# Patient Record
Sex: Male | Born: 1935 | Race: White | Hispanic: No | Marital: Married | Smoking: Former smoker
Health system: Southern US, Community
[De-identification: ages and names within clinical notes are randomized; demographics above are authoritative.]

## PROBLEM LIST (undated history)

## (undated) DIAGNOSIS — E042 Nontoxic multinodular goiter: Secondary | ICD-10-CM

## (undated) DIAGNOSIS — K219 Gastro-esophageal reflux disease without esophagitis: Secondary | ICD-10-CM

## (undated) DIAGNOSIS — H9319 Tinnitus, unspecified ear: Secondary | ICD-10-CM

## (undated) DIAGNOSIS — R918 Other nonspecific abnormal finding of lung field: Secondary | ICD-10-CM

## (undated) DIAGNOSIS — R7303 Prediabetes: Secondary | ICD-10-CM

## (undated) DIAGNOSIS — I10 Essential (primary) hypertension: Secondary | ICD-10-CM

## (undated) DIAGNOSIS — N400 Enlarged prostate without lower urinary tract symptoms: Secondary | ICD-10-CM

## (undated) DIAGNOSIS — M51369 Other intervertebral disc degeneration, lumbar region without mention of lumbar back pain or lower extremity pain: Secondary | ICD-10-CM

## (undated) DIAGNOSIS — F419 Anxiety disorder, unspecified: Secondary | ICD-10-CM

## (undated) DIAGNOSIS — M5136 Other intervertebral disc degeneration, lumbar region: Secondary | ICD-10-CM

## (undated) DIAGNOSIS — M5030 Other cervical disc degeneration, unspecified cervical region: Secondary | ICD-10-CM

## (undated) DIAGNOSIS — E7800 Pure hypercholesterolemia, unspecified: Secondary | ICD-10-CM

## (undated) HISTORY — DX: Nontoxic multinodular goiter: E04.2

## (undated) HISTORY — DX: Benign prostatic hyperplasia without lower urinary tract symptoms: N40.0

## (undated) HISTORY — DX: Anxiety disorder, unspecified: F41.9

## (undated) HISTORY — DX: Tinnitus, unspecified ear: H93.19

## (undated) HISTORY — DX: Other cervical disc degeneration, unspecified cervical region: M50.30

## (undated) HISTORY — DX: Other intervertebral disc degeneration, lumbar region: M51.36

## (undated) HISTORY — DX: Other intervertebral disc degeneration, lumbar region without mention of lumbar back pain or lower extremity pain: M51.369

## (undated) HISTORY — DX: Other nonspecific abnormal finding of lung field: R91.8

## (undated) HISTORY — PX: OTHER SURGICAL HISTORY: SHX169

## (undated) HISTORY — DX: Pure hypercholesterolemia, unspecified: E78.00

## (undated) HISTORY — DX: Prediabetes: R73.03

## (undated) HISTORY — DX: Essential (primary) hypertension: I10

## (undated) HISTORY — DX: Gastro-esophageal reflux disease without esophagitis: K21.9

## (undated) HISTORY — PX: HERNIA REPAIR: SHX51

---

## 1999-10-08 ENCOUNTER — Encounter: Payer: Self-pay | Admitting: Family Medicine

## 1999-10-08 ENCOUNTER — Encounter: Admission: RE | Admit: 1999-10-08 | Discharge: 1999-10-08 | Payer: Self-pay | Admitting: Family Medicine

## 2001-06-10 ENCOUNTER — Encounter: Admission: RE | Admit: 2001-06-10 | Discharge: 2001-06-10 | Payer: Self-pay | Admitting: Unknown Physician Specialty

## 2001-06-10 ENCOUNTER — Encounter: Payer: Self-pay | Admitting: Unknown Physician Specialty

## 2004-11-02 ENCOUNTER — Ambulatory Visit: Payer: Self-pay | Admitting: Family Medicine

## 2004-12-08 ENCOUNTER — Ambulatory Visit: Payer: Self-pay | Admitting: Family Medicine

## 2005-06-16 ENCOUNTER — Ambulatory Visit: Payer: Self-pay | Admitting: Family Medicine

## 2014-01-14 HISTORY — PX: AORTIC VALVE REPLACEMENT: SHX41

## 2014-04-29 ENCOUNTER — Encounter: Payer: Self-pay | Admitting: *Deleted

## 2014-04-30 ENCOUNTER — Encounter (INDEPENDENT_AMBULATORY_CARE_PROVIDER_SITE_OTHER): Payer: Self-pay

## 2014-04-30 ENCOUNTER — Encounter: Payer: Self-pay | Admitting: Neurology

## 2014-04-30 ENCOUNTER — Ambulatory Visit (INDEPENDENT_AMBULATORY_CARE_PROVIDER_SITE_OTHER): Payer: Medicare Other | Admitting: Neurology

## 2014-04-30 VITALS — BP 137/70 | HR 76 | Temp 97.7°F | Ht 67.0 in | Wt 197.5 lb

## 2014-04-30 DIAGNOSIS — R5381 Other malaise: Secondary | ICD-10-CM

## 2014-04-30 DIAGNOSIS — R269 Unspecified abnormalities of gait and mobility: Secondary | ICD-10-CM

## 2014-04-30 DIAGNOSIS — Z952 Presence of prosthetic heart valve: Secondary | ICD-10-CM

## 2014-04-30 DIAGNOSIS — Z954 Presence of other heart-valve replacement: Secondary | ICD-10-CM

## 2014-04-30 DIAGNOSIS — R251 Tremor, unspecified: Secondary | ICD-10-CM

## 2014-04-30 NOTE — Patient Instructions (Addendum)
Please continue to work on your walking and strengthening exercises.    I do want to suggest a few things today:  Remember to drink plenty of fluid, eat healthy meals and do not skip any meals. Try to eat protein with a every meal and eat a healthy snack such as fruit or nuts in between meals. Try to keep a regular sleep-wake schedule and try to exercise daily if you can. Use your walker at all times.   Try to stay active physically and mentally. Engage in social activities in your community and with your family and try to keep up with current events by reading the newspaper or watching the news. Try to do word puzzles and you may like to do word puzzles and brain games on the computer such as on [email or web address].   I would like to see you back in 3 months, sooner if we need to. Please call us with any interim questions, concerns, problems, updates or refill requests.  Our phone number is [phone]. We also have an after hours call service for urgent matters and there is a physician on-call for urgent questions, that cannot wait till the next work day. For any emergencies you know to call 911 or go to the nearest emergency room.

## 2014-04-30 NOTE — Progress Notes (Signed)
Subjective:    Patient ID: Roy Mitchell is a 80 y.o. male.  HPI    Huston Foley, MD, PhD [facility] [address] [address] [place], [place] [address]  Dear Harrold Donath,  I saw your patient, Roy Mitchell, upon your kind request in my neurologic clinic today for initial consultation of his tremor. The patient is accompanied by his daughter, Roy Mitchell and his wife today. As you know, Roy Mitchell with an underlying complex medical history of hyperlipidemia, recent pneumonia for which he was hospitalized from 03/27/2014 to 04/01/2014, BPH, reflux disease, degenerative neck disease, thyroid disease, prediabetes, hypertension, anxiety, recent aortic valve replacement for severe aortic stenosis in July 2015, who reports a hand tremor, right more than left for the past 10+ years. His family has noted some gait difficulties and balance issues and difficulty rising from the chair. His daughter is Publishing rights manager and is particularly worried about parkinsonism or Parkinson's disease. He is currently in home health physical therapy. After his setback with the pneumonia he was very deconditioned and was practically bedbound for some time. He has also had some issues with urinary retention for which he was on a Foley catheter and an in and out catheterization but has since then been able to regain urinary control. He has some constipation off and on which they are treating. He does not have much in the way of memory loss. He is supposed to start cardiac rehabilitation once he is able to go through with it as far as his stamina is concerned. This is projected to start in about a month from now. He has a total of 5 children the oldest of whom is 73 and the youngest is 37. His children, especially his youngest daughter are very intimately involved in his care. His wife is in reasonably good health and his primary caregiver.  His Past Medical History  Is Significant For: Past Medical History  Diagnosis Date  . Hypercholesterolemia   . Benign prostatic hypertrophy   . GERD (gastroesophageal reflux disease)   . DDD (degenerative disc disease), cervical   . Tinnitus   . Multinodular goiter   . Prediabetes   . DDD (degenerative disc disease), lumbar   . Hypertension   . Pulmonary nodules   . Anxiety disorder     His Past Surgical History Is Significant For: Past Surgical History  Procedure Laterality Date  . Aortic valve replacement  01/2014  . History of blood transfusion    . Hernia repair      6 months old    His Family History Is Significant For: Family History  Problem Relation Age of Onset  . Congestive Heart Failure Mother     His Social History Is Significant For: History   Social History  . Marital Status: Married    Spouse Name: Roy Mitchell    Number of Children: 5  . Years of Education: 12   Occupational History  .      retired   Social History Main Topics  . Smoking status: Former Games developer  . Smokeless tobacco: Never Used     Comment: QUIT IN 1970  . Alcohol Use: No  . Drug Use: No  . Sexual Activity: None   Other Topics Concern  . None   Social History Narrative   Patient consumes caffiene(candy)    His Allergies Are:  Not on File:   His Current Medications Are:  Outpatient Encounter Prescriptions as of 04/30/2014  Medication Sig  . albuterol (PROVENTIL HFA;VENTOLIN HFA) 108 (90 BASE) MCG/ACT inhaler Inhale 2 puffs into the lungs every 6 (six) hours as needed for wheezing or shortness of breath.  Marland Kitchen. albuterol (PROVENTIL) (2.5 MG/3ML) 0.083% nebulizer solution Take 2.5 mg by nebulization every 6 (six) hours as needed for wheezing or shortness of breath.  Marland Kitchen. CALCIUM-VITAMIN D PO Take 600 mg by mouth daily.  . clopidogrel (PLAVIX) 75 MG tablet Take 75 mg by mouth daily.  Marland Kitchen. dextromethorphan-guaiFENesin (MUCINEX DM) 30-600 MG per 12 hr tablet Take 1 tablet by mouth 2 (two) times daily.  Marland Kitchen.  escitalopram (LEXAPRO) 5 MG tablet Take 5 mg by mouth daily.  . finasteride (PROSCAR) 5 MG tablet Take 5 mg by mouth daily.  . Fluticasone-Salmeterol (ADVAIR) 250-50 MCG/DOSE AEPB Inhale 1 puff into the lungs 2 (two) times daily.  Marland Kitchen. ibuprofen (ADVIL,MOTRIN) 200 MG tablet Take 200 mg by mouth every 6 (six) hours as needed.  Marland Kitchen. LORazepam (ATIVAN) 2 MG tablet Take 2 mg by mouth daily. 1 1/2 tablet daily at bedtime  . Multiple Vitamins-Minerals (CENTRUM SILVER PO) Take 1 tablet by mouth daily.  . pantoprazole (PROTONIX) 20 MG tablet Take 20 mg by mouth daily.  . Polyethylene Glycol 3350 (MIRALAX PO) Take by mouth daily. As needed  . simvastatin (ZOCOR) 40 MG tablet Take 40 mg by mouth daily.  . tamsulosin (FLOMAX) 0.4 MG CAPS capsule Take 0.4 mg by mouth at bedtime.  . Vitamins-Lipotropics (LIPO-FLAVONOID PLUS PO) Take by mouth daily.  . [DISCONTINUED] diltiazem (DILTIAZEM HCL CD) 360 MG 24 hr capsule Take 360 mg by mouth daily.  . [DISCONTINUED] FA-Pyridoxine-Cyancobalamin (ALLANTEX PO) Take 4 mg by mouth daily.  :   Review of Systems:  Out of a complete 14 point review of systems, all are reviewed and negative with the exception of these symptoms as listed below: Review of Systems  HENT:       Ringing in ears  Allergic/Immunologic: Positive for environmental allergies.       Runny nose  Neurological:       Snoring, numbness, dizziness, tremor    Objective:  Neurologic Exam  Physical Exam Physical Examination:   Filed Vitals:   04/30/14 1358  BP: 137/70  Pulse: 76  Temp: 97.7 F (36.5 C)    General Examination: The patient is a very pleasant 80 y.o. male in no acute distress. He is situated in his wheelchair. He also brought in his 2 wheeled walker. He looks mildly deconditioned and frail.  HEENT: Normocephalic, atraumatic, pupils are equal, round and reactive to light and accommodation.Extraocular tracking shows mild saccadic breakdown without nystagmus noted. There is  limitation to upper gaze. There is no significant decrease in eye blink rate. Hearing is intact. face is symmetric with no significant facial masking and normal facial sensation. There is no lip, neck or jaw tremor. Neck is mildly to moderately rigid with intact passive ROM. There are no carotid bruits on auscultation. Oropharynx exam reveals moderate mouth dryness. No significant airway crowding is noted. Mallampati is class II. Tongue protrudes centrally and palate elevates symmetrically.   There is no drooling.   Chest: is clear to auscultation without wheezing, rhonchi or crackles noted.  Heart: sounds are regular and normal without murmurs, rubs or gallops noted.   Abdomen: is soft, non-tender and non-distended with normal bowel sounds appreciated on auscultation.  Extremities: There is 1+ pitting edema in the distal lower extremities bilaterally.   Skin: is warm and dry with no  trophic changes noted. Age-related changes are noted on the skin. He also has multiple old looking bruises over his hands and forearms. Of note he is on Plavix.  Musculoskeletal: exam reveals no obvious joint deformities, tenderness, joint swelling or erythema.  Neurologically:  Mental status: The patient is awake and alert, paying good  attention. He is able to provide the history. His wife and daugher provide details. He is oriented to: person, place, situation, day of week, month of year and year. His memory, attention, language and knowledge are fairly well-preserved. There is no aphasia, agnosia, apraxia or anomia. There is a no significant degree of bradyphrenia. Speech is mildly hypophonic with no dysarthria noted. Mood is congruent and affect is normal.  Cranial nerves are as described above under HEENT exam. In addition, shoulder shrug is normal with equal shoulder height noted.  Motor exam: Mildly thin bulk is noted. Strength is overall 4+ to 5 minus out of 5 with no weakness noted in the left hip flexor. Of  note he has a history of DVT in the left leg in the distant past. There are no dyskinesias noted. Tone is mildly rigid in the upper extremities with slight cogwheeling noted bilaterally. He has no resting tremor. He has a mild bilateral upper extremity postural and action tremor. He has no cogwheel rigidity or increase in tone in the lower extremities. Reflexes are 1+ in the upper extremities and absent in both lower extremities. Sensory exam is intact to light touch, pinprick, temperature sense and vibration sense. Fine motor skills with finger taps, hand movements and rapid alternating patting are overall mildly impaired as well as lower extremity fine motor skills. There is no significant lateralization. Romberg is not tested. Heel-to-shin is not really possible for him. He stands with difficulty and pushes himself up. He needs mild assistance. He uses his 2 wheeled walker fairly well. He is slightly insecure and slow. He does not have a parkinsonian shuffle. He has a moderate stoop and has a tendency to lean his upper body and his neck slightly to the right. He turns in several steps. He is slightly insecure with turning. Tandem walk is not possible.  Assessment and Plan:   In summary, Roy Mitchell is a very pleasant 80 y.o.-year old male with an underlying complex medical history of hyperlipidemia, recent pneumonia for which he was hospitalized from 03/27/2014 to 04/01/2014, BPH, reflux disease, degenerative neck disease, thyroid disease, prediabetes, hypertension, anxiety, recent aortic valve replacement for severe aortic stenosis in July 2015, who presents with a history of tremors of his upper extremities, gait disorder, and balance problems. His tremors have been ongoing for several years. Upon examination he has a mild bilateral upper extremity action and postural tremor, he has no resting tremor, and though he does have some upper extremity rigidity, neck rigidity but otherwise no lateralizing  findings in keeping with Parkinson's disease, I feel that most likely he has mild essential tremor. While he may have a touch of parkinsonism, I would be reluctant to treat his tremor or his very mild parkinsonian presentation. His gait disorder is probably a function of overall deconditioning, recent systemic illness, recent anesthesia and surgery, and in talking to the family, he has made quite some progress in his physical rehabilitation. At this juncture, I encouraged the patient to continue with home health therapy and consider cardiac rehabilitation if possible. He is advised to stay well hydrated and take care of good nutrition. We will not add any new tests  or medication at this time and follow him clinically. The patient and his family were in agreement. To that end, I would like to see him back in 3 months, sooner if the need arises.  Thank you very much for allowing me to participate in the care of this nice patient. If I can be of any further assistance to you please do not hesitate to call me at [phone].  Sincerely,   Huston Foley, MD, PhD

## 2014-06-26 ENCOUNTER — Encounter (HOSPITAL_COMMUNITY)
Admission: EM | Disposition: A | Discharge status: Nursing Home (Includes ICF) | Payer: Self-pay | Source: Home / Self Care | Attending: Internal Medicine

## 2014-06-26 ENCOUNTER — Inpatient Hospital Stay (HOSPITAL_COMMUNITY): Payer: Medicare Other | Admitting: Anesthesiology

## 2014-06-26 ENCOUNTER — Inpatient Hospital Stay (HOSPITAL_COMMUNITY): Payer: Medicare Other

## 2014-06-26 ENCOUNTER — Emergency Department (HOSPITAL_COMMUNITY): Payer: Medicare Other

## 2014-06-26 ENCOUNTER — Inpatient Hospital Stay (HOSPITAL_COMMUNITY)
Admission: EM | Admit: 2014-06-26 | Discharge: 2014-07-08 | DRG: 025 | Disposition: A | Discharge status: Other Acute Inpatient Hospital | Payer: Medicare Other | Attending: Internal Medicine | Admitting: Internal Medicine

## 2014-06-26 ENCOUNTER — Encounter (HOSPITAL_COMMUNITY): Payer: Self-pay | Admitting: *Deleted

## 2014-06-26 DIAGNOSIS — J90 Pleural effusion, not elsewhere classified: Secondary | ICD-10-CM | POA: Diagnosis present

## 2014-06-26 DIAGNOSIS — Z79899 Other long term (current) drug therapy: Secondary | ICD-10-CM | POA: Diagnosis not present

## 2014-06-26 DIAGNOSIS — S065X4A Traumatic subdural hemorrhage with loss of consciousness of 6 hours to 24 hours, initial encounter: Secondary | ICD-10-CM | POA: Diagnosis present

## 2014-06-26 DIAGNOSIS — S065XAA Traumatic subdural hemorrhage with loss of consciousness status unknown, initial encounter: Secondary | ICD-10-CM

## 2014-06-26 DIAGNOSIS — Z888 Allergy status to other drugs, medicaments and biological substances status: Secondary | ICD-10-CM

## 2014-06-26 DIAGNOSIS — W19XXXA Unspecified fall, initial encounter: Secondary | ICD-10-CM | POA: Diagnosis present

## 2014-06-26 DIAGNOSIS — T17590A Other foreign object in bronchus causing asphyxiation, initial encounter: Secondary | ICD-10-CM | POA: Diagnosis not present

## 2014-06-26 DIAGNOSIS — J9819 Other pulmonary collapse: Secondary | ICD-10-CM | POA: Insufficient documentation

## 2014-06-26 DIAGNOSIS — R4701 Aphasia: Secondary | ICD-10-CM | POA: Diagnosis present

## 2014-06-26 DIAGNOSIS — Z880 Allergy status to penicillin: Secondary | ICD-10-CM | POA: Diagnosis not present

## 2014-06-26 DIAGNOSIS — R269 Unspecified abnormalities of gait and mobility: Secondary | ICD-10-CM | POA: Diagnosis present

## 2014-06-26 DIAGNOSIS — G8191 Hemiplegia, unspecified affecting right dominant side: Secondary | ICD-10-CM | POA: Diagnosis present

## 2014-06-26 DIAGNOSIS — R1310 Dysphagia, unspecified: Secondary | ICD-10-CM | POA: Diagnosis present

## 2014-06-26 DIAGNOSIS — I10 Essential (primary) hypertension: Secondary | ICD-10-CM | POA: Diagnosis present

## 2014-06-26 DIAGNOSIS — Z87891 Personal history of nicotine dependence: Secondary | ICD-10-CM

## 2014-06-26 DIAGNOSIS — Z7951 Long term (current) use of inhaled steroids: Secondary | ICD-10-CM | POA: Diagnosis not present

## 2014-06-26 DIAGNOSIS — F329 Major depressive disorder, single episode, unspecified: Secondary | ICD-10-CM | POA: Diagnosis present

## 2014-06-26 DIAGNOSIS — W1830XA Fall on same level, unspecified, initial encounter: Secondary | ICD-10-CM | POA: Diagnosis present

## 2014-06-26 DIAGNOSIS — Z9981 Dependence on supplemental oxygen: Secondary | ICD-10-CM | POA: Diagnosis not present

## 2014-06-26 DIAGNOSIS — Z4659 Encounter for fitting and adjustment of other gastrointestinal appliance and device: Secondary | ICD-10-CM

## 2014-06-26 DIAGNOSIS — Y92480 Sidewalk as the place of occurrence of the external cause: Secondary | ICD-10-CM

## 2014-06-26 DIAGNOSIS — S065X0A Traumatic subdural hemorrhage without loss of consciousness, initial encounter: Secondary | ICD-10-CM | POA: Diagnosis present

## 2014-06-26 DIAGNOSIS — Z7902 Long term (current) use of antithrombotics/antiplatelets: Secondary | ICD-10-CM | POA: Diagnosis not present

## 2014-06-26 DIAGNOSIS — K219 Gastro-esophageal reflux disease without esophagitis: Secondary | ICD-10-CM | POA: Diagnosis present

## 2014-06-26 DIAGNOSIS — Z953 Presence of xenogenic heart valve: Secondary | ICD-10-CM

## 2014-06-26 DIAGNOSIS — J189 Pneumonia, unspecified organism: Secondary | ICD-10-CM

## 2014-06-26 DIAGNOSIS — S065X9A Traumatic subdural hemorrhage with loss of consciousness of unspecified duration, initial encounter: Secondary | ICD-10-CM

## 2014-06-26 DIAGNOSIS — Z66 Do not resuscitate: Secondary | ICD-10-CM | POA: Diagnosis not present

## 2014-06-26 DIAGNOSIS — J9690 Respiratory failure, unspecified, unspecified whether with hypoxia or hypercapnia: Secondary | ICD-10-CM

## 2014-06-26 DIAGNOSIS — Z452 Encounter for adjustment and management of vascular access device: Secondary | ICD-10-CM

## 2014-06-26 DIAGNOSIS — J9600 Acute respiratory failure, unspecified whether with hypoxia or hypercapnia: Secondary | ICD-10-CM | POA: Insufficient documentation

## 2014-06-26 DIAGNOSIS — T85598A Other mechanical complication of other gastrointestinal prosthetic devices, implants and grafts, initial encounter: Secondary | ICD-10-CM

## 2014-06-26 DIAGNOSIS — J9601 Acute respiratory failure with hypoxia: Secondary | ICD-10-CM

## 2014-06-26 DIAGNOSIS — J449 Chronic obstructive pulmonary disease, unspecified: Secondary | ICD-10-CM | POA: Diagnosis present

## 2014-06-26 DIAGNOSIS — G250 Essential tremor: Secondary | ICD-10-CM | POA: Diagnosis present

## 2014-06-26 DIAGNOSIS — E785 Hyperlipidemia, unspecified: Secondary | ICD-10-CM | POA: Diagnosis present

## 2014-06-26 DIAGNOSIS — R1110 Vomiting, unspecified: Secondary | ICD-10-CM | POA: Diagnosis not present

## 2014-06-26 DIAGNOSIS — F419 Anxiety disorder, unspecified: Secondary | ICD-10-CM | POA: Diagnosis present

## 2014-06-26 DIAGNOSIS — D649 Anemia, unspecified: Secondary | ICD-10-CM | POA: Diagnosis present

## 2014-06-26 DIAGNOSIS — R739 Hyperglycemia, unspecified: Secondary | ICD-10-CM | POA: Diagnosis present

## 2014-06-26 DIAGNOSIS — F32A Depression, unspecified: Secondary | ICD-10-CM | POA: Diagnosis present

## 2014-06-26 DIAGNOSIS — Z791 Long term (current) use of non-steroidal anti-inflammatories (NSAID): Secondary | ICD-10-CM | POA: Diagnosis not present

## 2014-06-26 DIAGNOSIS — I6200 Nontraumatic subdural hemorrhage, unspecified: Secondary | ICD-10-CM | POA: Diagnosis present

## 2014-06-26 DIAGNOSIS — N400 Enlarged prostate without lower urinary tract symptoms: Secondary | ICD-10-CM | POA: Diagnosis present

## 2014-06-26 DIAGNOSIS — Z978 Presence of other specified devices: Secondary | ICD-10-CM

## 2014-06-26 DIAGNOSIS — R2990 Unspecified symptoms and signs involving the nervous system: Secondary | ICD-10-CM

## 2014-06-26 HISTORY — PX: CRANIOTOMY: SHX93

## 2014-06-26 LAB — URINALYSIS, ROUTINE W REFLEX MICROSCOPIC
Bilirubin Urine: NEGATIVE
Glucose, UA: NEGATIVE mg/dL
Hgb urine dipstick: NEGATIVE
KETONES UR: NEGATIVE mg/dL
Leukocytes, UA: NEGATIVE
NITRITE: NEGATIVE
PH: 7.5 (ref 5.0–8.0)
PROTEIN: NEGATIVE mg/dL
Specific Gravity, Urine: 1.012 (ref 1.005–1.030)
UROBILINOGEN UA: 0.2 mg/dL (ref 0.0–1.0)

## 2014-06-26 LAB — COMPREHENSIVE METABOLIC PANEL
ALK PHOS: 70 U/L (ref 39–117)
ALT: 13 U/L (ref 0–53)
AST: 14 U/L (ref 0–37)
Albumin: 3.5 g/dL (ref 3.5–5.2)
Anion gap: 10 (ref 5–15)
BUN: 17 mg/dL (ref 6–23)
CO2: 29 mEq/L (ref 19–32)
Calcium: 9.2 mg/dL (ref 8.4–10.5)
Chloride: 99 mEq/L (ref 96–112)
Creatinine, Ser: 0.76 mg/dL (ref 0.50–1.35)
GFR calc Af Amer: >90 mL/min (ref >90)
GFR calc non Af Amer: 79 mL/min — ABNORMAL LOW (ref >90)
Glucose, Bld: 111 mg/dL — ABNORMAL HIGH (ref 70–99)
POTASSIUM: 4 meq/L (ref 3.7–5.3)
SODIUM: 138 meq/L (ref 137–147)
Total Bilirubin: 0.8 mg/dL (ref 0.3–1.2)
Total Protein: 6.9 g/dL (ref 6.0–8.3)

## 2014-06-26 LAB — CBC
HCT: 36.8 % — ABNORMAL LOW (ref 39.0–52.0)
Hemoglobin: 12.2 g/dL — ABNORMAL LOW (ref 13.0–17.0)
MCH: 27.2 pg (ref 26.0–34.0)
MCHC: 33.2 g/dL (ref 30.0–36.0)
MCV: 82 fL (ref 78.0–100.0)
PLATELETS: 144 10*3/uL — AB (ref 150–400)
RBC: 4.49 MIL/uL (ref 4.22–5.81)
RDW: 16.4 % — AB (ref 11.5–15.5)
WBC: 7.8 10*3/uL (ref 4.0–10.5)

## 2014-06-26 LAB — DIFFERENTIAL
Basophils Absolute: 0 10*3/uL (ref 0.0–0.1)
Basophils Relative: 1 % (ref 0–1)
EOS ABS: 0.1 10*3/uL (ref 0.0–0.7)
Eosinophils Relative: 1 % (ref 0–5)
LYMPHS ABS: 1.3 10*3/uL (ref 0.7–4.0)
Lymphocytes Relative: 17 % (ref 12–46)
Monocytes Absolute: 0.7 10*3/uL (ref 0.1–1.0)
Monocytes Relative: 9 % (ref 3–12)
NEUTROS PCT: 74 % (ref 43–77)
Neutro Abs: 5.8 10*3/uL (ref 1.7–7.7)

## 2014-06-26 LAB — RAPID URINE DRUG SCREEN, HOSP PERFORMED
Amphetamines: NOT DETECTED
BARBITURATES: NOT DETECTED
BENZODIAZEPINES: NOT DETECTED
Cocaine: NOT DETECTED
Opiates: NOT DETECTED
TETRAHYDROCANNABINOL: NOT DETECTED

## 2014-06-26 LAB — I-STAT TROPONIN, ED: Troponin i, poc: 0 ng/mL (ref 0.00–0.08)

## 2014-06-26 LAB — APTT: aPTT: 29 seconds (ref 24–37)

## 2014-06-26 LAB — PROTIME-INR
INR: 1.02 (ref 0.00–1.49)
PROTHROMBIN TIME: 13.5 s (ref 11.6–15.2)

## 2014-06-26 LAB — ETHANOL: Alcohol, Ethyl (B): <11 mg/dL (ref 0–11)

## 2014-06-26 SURGERY — CRANIOTOMY HEMATOMA EVACUATION EPIDURAL
Anesthesia: General | Site: Head | Laterality: Left

## 2014-06-26 MED ORDER — FENTANYL CITRATE 0.05 MG/ML IJ SOLN: 25.0000 ug | INTRAMUSCULAR | Status: DC | PRN

## 2014-06-26 MED ORDER — ONDANSETRON HCL 4 MG/2ML IJ SOLN
4.0000 mg | INTRAMUSCULAR | Status: DC | PRN
  Administered 2014-06-27 – 2014-07-08 (×3): 4 mg via INTRAVENOUS
  Filled 2014-06-26 (×3): qty 2

## 2014-06-26 MED ORDER — SODIUM CHLORIDE 0.9 % IR SOLN
Status: DC | PRN
  Administered 2014-06-26: 500 mL

## 2014-06-26 MED ORDER — MIDAZOLAM HCL 2 MG/2ML IJ SOLN
INTRAMUSCULAR | Status: AC
  Filled 2014-06-26: qty 2

## 2014-06-26 MED ORDER — VANCOMYCIN HCL IN DEXTROSE 1-5 GM/200ML-% IV SOLN
INTRAVENOUS | Status: AC
Start: 2014-06-26 — End: 2014-06-26
  Administered 2014-06-26: 1000 mg via INTRAVENOUS
  Filled 2014-06-26: qty 200

## 2014-06-26 MED ORDER — BUPIVACAINE HCL (PF) 0.5 % IJ SOLN
INTRAMUSCULAR | Status: DC | PRN
  Administered 2014-06-26: 5 mL

## 2014-06-26 MED ORDER — ONDANSETRON HCL 4 MG PO TABS
4.0000 mg | ORAL_TABLET | ORAL | Status: DC | PRN
Start: 2014-06-26 — End: 2014-06-29

## 2014-06-26 MED ORDER — MIDAZOLAM HCL 2 MG/2ML IJ SOLN
1.0000 mg | INTRAMUSCULAR | Status: DC | PRN
Start: 2014-06-26 — End: 2014-06-27

## 2014-06-26 MED ORDER — THROMBIN 20000 UNITS EX SOLR
CUTANEOUS | Status: DC | PRN
  Administered 2014-06-26: 20 mL via TOPICAL

## 2014-06-26 MED ORDER — ALBUTEROL SULFATE HFA 108 (90 BASE) MCG/ACT IN AERS: 2.0000 | INHALATION_SPRAY | Freq: Four times a day (QID) | RESPIRATORY_TRACT | Status: DC | PRN

## 2014-06-26 MED ORDER — 0.9 % SODIUM CHLORIDE (POUR BTL) OPTIME
TOPICAL | Status: DC | PRN
  Administered 2014-06-26 (×2): 1000 mL

## 2014-06-26 MED ORDER — LIDOCAINE-EPINEPHRINE 1 %-1:100000 IJ SOLN
INTRAMUSCULAR | Status: DC | PRN
  Administered 2014-06-26: 5 mL via INTRADERMAL

## 2014-06-26 MED ORDER — MOMETASONE FURO-FORMOTEROL FUM 100-5 MCG/ACT IN AERO
2.0000 | INHALATION_SPRAY | Freq: Two times a day (BID) | RESPIRATORY_TRACT | Status: DC
  Administered 2014-06-28 – 2014-06-29 (×2): 2 via RESPIRATORY_TRACT
  Filled 2014-06-26 (×2): qty 8.8

## 2014-06-26 MED ORDER — FENTANYL CITRATE 0.05 MG/ML IJ SOLN
50.0000 ug | INTRAMUSCULAR | Status: DC | PRN
  Administered 2014-06-27 – 2014-06-28 (×3): 50 ug via INTRAVENOUS
  Filled 2014-06-26 (×3): qty 2

## 2014-06-26 MED ORDER — PROPOFOL 10 MG/ML IV EMUL
0.0000 ug/kg/min | INTRAVENOUS | Status: DC
  Administered 2014-06-26: 20 ug/kg/min via INTRAVENOUS
  Administered 2014-06-27: 15 ug/kg/min via INTRAVENOUS
  Filled 2014-06-26 (×2): qty 100

## 2014-06-26 MED ORDER — FENTANYL CITRATE 0.05 MG/ML IJ SOLN
INTRAMUSCULAR | Status: DC | PRN
  Administered 2014-06-26: 100 ug via INTRAVENOUS
  Administered 2014-06-26: 150 ug via INTRAVENOUS

## 2014-06-26 MED ORDER — NICARDIPINE HCL IN NACL 20-0.86 MG/200ML-% IV SOLN
5.0000 mg/h | Freq: Once | INTRAVENOUS | Status: AC
  Administered 2014-06-26: 3 mg/h via INTRAVENOUS
  Filled 2014-06-26: qty 200

## 2014-06-26 MED ORDER — THROMBIN 5000 UNITS EX SOLR
OROMUCOSAL | Status: DC | PRN
  Administered 2014-06-26: 5 mL via TOPICAL

## 2014-06-26 MED ORDER — CHLORHEXIDINE GLUCONATE 0.12 % MT SOLN
15.0000 mL | Freq: Two times a day (BID) | OROMUCOSAL | Status: DC
  Administered 2014-06-27 – 2014-06-28 (×4): 15 mL via OROMUCOSAL
  Filled 2014-06-26 (×4): qty 15

## 2014-06-26 MED ORDER — NICARDIPINE HCL IN NACL 20-0.86 MG/200ML-% IV SOLN
5.0000 mg/h | Freq: Once | INTRAVENOUS | Status: AC
  Administered 2014-06-26: 5 mg/h via INTRAVENOUS
  Filled 2014-06-26: qty 200

## 2014-06-26 MED ORDER — NICARDIPINE HCL IN NACL 20-0.86 MG/200ML-% IV SOLN
INTRAVENOUS | Status: DC | PRN
  Administered 2014-06-26: 3 mg/h via INTRAVENOUS

## 2014-06-26 MED ORDER — LABETALOL HCL 5 MG/ML IV SOLN
10.0000 mg | INTRAVENOUS | Status: DC | PRN
  Administered 2014-06-27 – 2014-07-02 (×6): 20 mg via INTRAVENOUS
  Administered 2014-07-02: 10 mg via INTRAVENOUS
  Administered 2014-07-02 (×2): 40 mg via INTRAVENOUS
  Administered 2014-07-03: 20 mg via INTRAVENOUS
  Administered 2014-07-03 (×2): 10 mg via INTRAVENOUS
  Administered 2014-07-03 (×5): 20 mg via INTRAVENOUS
  Administered 2014-07-03 (×2): 10 mg via INTRAVENOUS
  Administered 2014-07-04 (×5): 20 mg via INTRAVENOUS
  Administered 2014-07-05: 10 mg via INTRAVENOUS
  Administered 2014-07-05 (×2): 20 mg via INTRAVENOUS
  Administered 2014-07-05: 10 mg via INTRAVENOUS
  Administered 2014-07-05 – 2014-07-06 (×5): 20 mg via INTRAVENOUS
  Administered 2014-07-06: 10 mg via INTRAVENOUS
  Administered 2014-07-06: 20 mg via INTRAVENOUS
  Administered 2014-07-08: 10 mg via INTRAVENOUS
  Filled 2014-06-26 (×3): qty 4
  Filled 2014-06-26: qty 8
  Filled 2014-06-26 (×5): qty 4
  Filled 2014-06-26: qty 8
  Filled 2014-06-26: qty 4
  Filled 2014-06-26: qty 8
  Filled 2014-06-26 (×2): qty 4
  Filled 2014-06-26: qty 8
  Filled 2014-06-26 (×10): qty 4
  Filled 2014-06-26: qty 8
  Filled 2014-06-26 (×4): qty 4

## 2014-06-26 MED ORDER — PROPOFOL 10 MG/ML IV BOLUS
INTRAVENOUS | Status: DC | PRN
  Administered 2014-06-26: 120 mg via INTRAVENOUS

## 2014-06-26 MED ORDER — SODIUM CHLORIDE 0.9 % IV SOLN
INTRAVENOUS | Status: DC | PRN
  Administered 2014-06-26: 20:00:00 via INTRAVENOUS

## 2014-06-26 MED ORDER — ROCURONIUM BROMIDE 100 MG/10ML IV SOLN
INTRAVENOUS | Status: DC | PRN
  Administered 2014-06-26: 50 mg via INTRAVENOUS
  Administered 2014-06-26: 30 mg via INTRAVENOUS

## 2014-06-26 MED ORDER — SODIUM CHLORIDE 0.9 % IV SOLN
INTRAVENOUS | Status: DC
  Administered 2014-06-26 – 2014-07-05 (×3): via INTRAVENOUS

## 2014-06-26 MED ORDER — PANTOPRAZOLE SODIUM 40 MG IV SOLR
40.0000 mg | INTRAVENOUS | Status: DC
  Administered 2014-06-26 – 2014-06-28 (×3): 40 mg via INTRAVENOUS
  Filled 2014-06-26 (×5): qty 40

## 2014-06-26 MED ORDER — MIDAZOLAM HCL 2 MG/2ML IJ SOLN: 1.0000 mg | INTRAMUSCULAR | Status: DC | PRN

## 2014-06-26 MED ORDER — PROPOFOL 10 MG/ML IV BOLUS
INTRAVENOUS | Status: AC
  Filled 2014-06-26: qty 20

## 2014-06-26 MED ORDER — SODIUM CHLORIDE 0.9 % IV SOLN
10.0000 mg | INTRAVENOUS | Status: DC | PRN
  Administered 2014-06-26: 25 ug/min via INTRAVENOUS

## 2014-06-26 MED ORDER — LEVETIRACETAM IN NACL 500 MG/100ML IV SOLN
500.0000 mg | Freq: Two times a day (BID) | INTRAVENOUS | Status: DC
  Administered 2014-06-26 – 2014-06-28 (×4): 500 mg via INTRAVENOUS
  Filled 2014-06-26 (×5): qty 100

## 2014-06-26 MED ORDER — PANTOPRAZOLE SODIUM 40 MG IV SOLR: 40.0000 mg | Freq: Every day | INTRAVENOUS | Status: DC

## 2014-06-26 MED ORDER — PROMETHAZINE HCL 25 MG PO TABS: 12.5000 mg | ORAL_TABLET | ORAL | Status: DC | PRN

## 2014-06-26 MED ORDER — CETYLPYRIDINIUM CHLORIDE 0.05 % MT LIQD
7.0000 mL | Freq: Four times a day (QID) | OROMUCOSAL | Status: DC
  Administered 2014-06-27 – 2014-06-30 (×11): 7 mL via OROMUCOSAL

## 2014-06-26 MED ORDER — MIDAZOLAM HCL 2 MG/2ML IJ SOLN
INTRAMUSCULAR | Status: DC | PRN
  Administered 2014-06-26: 2 mg via INTRAVENOUS

## 2014-06-26 MED ORDER — FENTANYL CITRATE 0.05 MG/ML IJ SOLN
INTRAMUSCULAR | Status: AC
  Filled 2014-06-26: qty 5

## 2014-06-26 MED ORDER — ALBUTEROL SULFATE (2.5 MG/3ML) 0.083% IN NEBU: 2.5000 mg | INHALATION_SOLUTION | Freq: Four times a day (QID) | RESPIRATORY_TRACT | Status: DC | PRN

## 2014-06-26 SURGICAL SUPPLY — 78 items
BAG DECANTER FOR FLEXI CONT (MISCELLANEOUS) ×3 IMPLANT
BIT DRILL WIRE PASS 1.3MM (BIT) IMPLANT
BNDG GAUZE ELAST 4 BULKY (GAUZE/BANDAGES/DRESSINGS) ×3 IMPLANT
BRUSH SCRUB EZ PLAIN DRY (MISCELLANEOUS) ×3 IMPLANT
BUR ACORN 6.0 (BURR) ×2 IMPLANT
BUR ACORN 6.0MM (BURR) ×1
BUR ADDG 1.1 (BURR) IMPLANT
BUR ADDG 1.1MM (BURR)
BUR ROUTER D-58 CRANI (BURR) IMPLANT
CANISTER SUCT 3000ML (MISCELLANEOUS) ×3 IMPLANT
CLIP TI MEDIUM 6 (CLIP) IMPLANT
CONT SPEC 4OZ CLIKSEAL STRL BL (MISCELLANEOUS) ×3 IMPLANT
CORDS BIPOLAR (ELECTRODE) ×3 IMPLANT
DECANTER SPIKE VIAL GLASS SM (MISCELLANEOUS) ×3 IMPLANT
DRAIN CHANNEL 10M FLAT 3/4 FLT (DRAIN) IMPLANT
DRAIN PENROSE 1/2X12 LTX STRL (WOUND CARE) IMPLANT
DRAPE SURG IRRIG POUCH 19X23 (DRAPES) IMPLANT
DRAPE WARM FLUID 44X44 (DRAPE) ×3 IMPLANT
DRILL WIRE PASS 1.3MM (BIT)
DRSG ADAPTIC 3X8 NADH LF (GAUZE/BANDAGES/DRESSINGS) IMPLANT
DRSG OPSITE 4X5.5 SM (GAUZE/BANDAGES/DRESSINGS) ×3 IMPLANT
DRSG PAD ABDOMINAL 8X10 ST (GAUZE/BANDAGES/DRESSINGS) IMPLANT
DRSG TELFA 3X8 NADH (GAUZE/BANDAGES/DRESSINGS) ×3 IMPLANT
DURAPREP 6ML APPLICATOR 50/CS (WOUND CARE) ×3 IMPLANT
ELECT CAUTERY BLADE 6.4 (BLADE) ×3 IMPLANT
ELECT REM PT RETURN 9FT ADLT (ELECTROSURGICAL) ×3
ELECTRODE REM PT RTRN 9FT ADLT (ELECTROSURGICAL) ×1 IMPLANT
EVACUATOR SILICONE 100CC (DRAIN) IMPLANT
GAUZE SPONGE 4X4 12PLY STRL (GAUZE/BANDAGES/DRESSINGS) IMPLANT
GAUZE SPONGE 4X4 16PLY XRAY LF (GAUZE/BANDAGES/DRESSINGS) IMPLANT
GLOVE BIO SURGEON STRL SZ7 (GLOVE) ×6 IMPLANT
GLOVE BIO SURGEON STRL SZ7.5 (GLOVE) IMPLANT
GLOVE BIOGEL PI IND STRL 7.5 (GLOVE) ×1 IMPLANT
GLOVE BIOGEL PI IND STRL 8.5 (GLOVE) ×1 IMPLANT
GLOVE BIOGEL PI INDICATOR 7.5 (GLOVE) ×2
GLOVE BIOGEL PI INDICATOR 8.5 (GLOVE) ×2
GLOVE ECLIPSE 8.5 STRL (GLOVE) ×3 IMPLANT
GLOVE EXAM NITRILE LRG STRL (GLOVE) IMPLANT
GLOVE EXAM NITRILE MD LF STRL (GLOVE) IMPLANT
GLOVE EXAM NITRILE XL STR (GLOVE) IMPLANT
GLOVE EXAM NITRILE XS STR PU (GLOVE) IMPLANT
GOWN STRL REUS W/ TWL LRG LVL3 (GOWN DISPOSABLE) IMPLANT
GOWN STRL REUS W/ TWL XL LVL3 (GOWN DISPOSABLE) ×1 IMPLANT
GOWN STRL REUS W/TWL 2XL LVL3 (GOWN DISPOSABLE) ×3 IMPLANT
GOWN STRL REUS W/TWL LRG LVL3 (GOWN DISPOSABLE)
GOWN STRL REUS W/TWL XL LVL3 (GOWN DISPOSABLE) ×2
GRAFT DURAGEN MATRIX 2WX2L ×3 IMPLANT
HEMOSTAT SURGICEL 2X14 (HEMOSTASIS) IMPLANT
HOOK DURA (MISCELLANEOUS) ×3 IMPLANT
KIT BASIN OR (CUSTOM PROCEDURE TRAY) ×3 IMPLANT
KIT ROOM TURNOVER OR (KITS) ×3 IMPLANT
NEEDLE HYPO 22GX1.5 SAFETY (NEEDLE) ×3 IMPLANT
NS IRRIG 1000ML POUR BTL (IV SOLUTION) ×6 IMPLANT
PACK CRANIOTOMY (CUSTOM PROCEDURE TRAY) ×3 IMPLANT
PATTIES SURGICAL .5 X.5 (GAUZE/BANDAGES/DRESSINGS) IMPLANT
PATTIES SURGICAL .5 X3 (DISPOSABLE) IMPLANT
PATTIES SURGICAL 1X1 (DISPOSABLE) IMPLANT
PIN MAYFIELD SKULL DISP (PIN) IMPLANT
PLATE 1.5  2HOLE LNG NEURO (Plate) ×4 IMPLANT
PLATE 1.5 2HOLE LNG NEURO (Plate) ×2 IMPLANT
PLATE 1.5/0.5 13MM BURR HOLE (Plate) ×3 IMPLANT
SCREW SELF DRILL HT 1.5/4MM (Screw) ×24 IMPLANT
SPECIMEN JAR SMALL (MISCELLANEOUS) IMPLANT
SPONGE NEURO XRAY DETECT 1X3 (DISPOSABLE) IMPLANT
SPONGE SURGIFOAM ABS GEL 100 (HEMOSTASIS) IMPLANT
STAPLER SKIN PROX WIDE 3.9 (STAPLE) ×3 IMPLANT
SUT ETHILON 3 0 FSL (SUTURE) IMPLANT
SUT NURALON 4 0 TR CR/8 (SUTURE) ×6 IMPLANT
SUT VIC AB 2-0 CP2 18 (SUTURE) ×9 IMPLANT
SYR 20ML ECCENTRIC (SYRINGE) ×3 IMPLANT
SYR CONTROL 10ML LL (SYRINGE) ×3 IMPLANT
TAPE CLOTH 1X10 TAN NS (GAUZE/BANDAGES/DRESSINGS) ×3 IMPLANT
TOWEL OR 17X24 6PK STRL BLUE (TOWEL DISPOSABLE) ×3 IMPLANT
TOWEL OR 17X26 10 PK STRL BLUE (TOWEL DISPOSABLE) ×3 IMPLANT
TRAP SPECIMEN MUCOUS 40CC (MISCELLANEOUS) IMPLANT
TRAY FOLEY CATH 14FRSI W/METER (CATHETERS) IMPLANT
UNDERPAD 30X30 INCONTINENT (UNDERPADS AND DIAPERS) IMPLANT
WATER STERILE IRR 1000ML POUR (IV SOLUTION) ×3 IMPLANT

## 2014-06-26 NOTE — Anesthesia Postprocedure Evaluation (Signed)
  Anesthesia Post-op Note  Patient: Roy Mitchell  Procedure(s) Performed: Procedure(s): Left Parietal Craniotomy for Subdural Hematoma (Left)  Patient Location: ICU  Anesthesia Type:General  Level of Consciousness: sedated and unresponsive  Airway and Oxygen Therapy: Patient remains intubated and on ventilator  Post-op Pain: none  Post-op Assessment: Post-op Vital signs reviewed, Patient's Cardiovascular Status Stable and Respiratory Function Stable  Post-op Vital Signs: Reviewed  Filed Vitals:   06/26/14 1830  BP: 144/68  Pulse: 82  Temp:   Resp: 17    Complications: No apparent anesthesia complications

## 2014-06-26 NOTE — ED Notes (Signed)
MD at bedside. 

## 2014-06-26 NOTE — ED Notes (Signed)
Pt in via [facility], per report pt had fall x 2 days ago, hitting head, with hand size bruise to left ribcage area, -LOC, pt takes Plavix, per report pt LSN 20:00 last night, pt woke up this morning @ 6am & wife noticed he was calling her the last name, pt has intermittent expressive aphasia & slurred speech, pt arrives to ED A&O x4, follows commands, speaks in complete sentences

## 2014-06-26 NOTE — Anesthesia Preprocedure Evaluation (Signed)
Anesthesia Evaluation  Patient identified by MRN, date of birth, ID band Patient awake    Reviewed: Allergy & Precautions, H&P , NPO status , Patient's Chart, lab work & pertinent test results  Airway        Dental no notable dental hx.    Pulmonary neg pulmonary ROS, former smoker,    Pulmonary exam normal       Cardiovascular hypertension, Pt. on medications  S/p TAVR 3 months ago    Neuro/Psych Anxiety negative neurological ROS     GI/Hepatic Neg liver ROS, GERD-  Medicated,  Endo/Other  negative endocrine ROS  Renal/GU negative Renal ROS  negative genitourinary   Musculoskeletal  (+) Arthritis -, Osteoarthritis,    Abdominal   Peds  Hematology negative hematology ROS (+)   Anesthesia Other Findings   Reproductive/Obstetrics negative OB ROS                             Anesthesia Physical Anesthesia Plan  ASA: III  Anesthesia Plan: General   Post-op Pain Management:    Induction: Intravenous  Airway Management Planned: Oral ETT  Additional Equipment: Arterial line  Intra-op Plan:   Post-operative Plan: Possible Post-op intubation/ventilation  Informed Consent: I have reviewed the patients History and Physical, chart, labs and discussed the procedure including the risks, benefits and alternatives for the proposed anesthesia with the patient or authorized representative who has indicated his/her understanding and acceptance.   Dental advisory given  Plan Discussed with: CRNA  Anesthesia Plan Comments:         Anesthesia Quick Evaluation

## 2014-06-26 NOTE — ED Notes (Signed)
MD aware that pts family is requesting for MD speak with Dr. Meryl DareSadie.

## 2014-06-26 NOTE — Plan of Care (Signed)
Problem: Consults Goal: Diagnosis - Craniotomy Outcome: Completed/Met Date Met:  06/26/14 Craniotomy for Subdural Hematoma evacuation

## 2014-06-26 NOTE — Progress Notes (Signed)
Acute SDH Worsening mental status - Dr Andrey CampanileWilson of [facility] called . I then spoke to Dr Danielle DessElsner. Patient wil go to  OR. PCCM to see post op for vent mgmt. Goal SBP is 130-150 per Dr Danielle DessElsner - normal range  Dr. Kalman ShanMurali Casyn Becvar, M.D., [facility] Pulmonary and Critical Care Medicine Staff Physician [facility] Pager: [phone], If no answer or between  15:00h - 7:00h: call [phone]  06/26/2014 6:11 PM

## 2014-06-26 NOTE — ED Notes (Signed)
Neurosurgery at bedside- updating family on plans for surgery

## 2014-06-26 NOTE — Transfer of Care (Signed)
Immediate Anesthesia Transfer of Care Note  Patient: Roy Mitchell  Procedure(s) Performed: Procedure(s): Left Parietal Craniotomy for Subdural Hematoma (Left)  Patient Location: NICU  Anesthesia Type:General  Level of Consciousness: Patient remains intubated per anesthesia plan  Airway & Oxygen Therapy: Patient remains intubated per anesthesia plan and Patient placed on Ventilator (see vital sign flow sheet for setting)  Post-op Assessment: Report given to PACU RN and Post -op Vital signs reviewed and stable  Post vital signs: Reviewed and stable  Complications: No apparent anesthesia complications

## 2014-06-26 NOTE — ED Notes (Signed)
Family speaking to Dr. Caralee AtesElsiner on the phone.

## 2014-06-26 NOTE — ED Notes (Signed)
Spoke to Dr. Andrey CampanileWilson about patient condition. Cardene has been stopped for 15 min and BP now 150/72

## 2014-06-26 NOTE — ED Notes (Signed)
Pt requesting to speak with Dr. Danielle DessElsner. Paged to this nurse's phone.

## 2014-06-26 NOTE — Anesthesia Procedure Notes (Addendum)
Procedure Name: Intubation Date/Time: 06/26/2014 7:59 PM Performed by: Molli HazardGORDON, Marena Witts M Pre-anesthesia Checklist: Patient identified, Emergency Drugs available, Suction available and Patient being monitored Patient Re-evaluated:Patient Re-evaluated prior to inductionOxygen Delivery Method: Circle system utilized Intubation Type: IV induction Ventilation: Mask ventilation without difficulty and Oral airway inserted - appropriate to patient size Laryngoscope Size: Hyacinth MeekerMiller and 2 Grade View: Grade III Tube type: Subglottic suction tube Tube size: 7.5 mm Number of attempts: 1 Airway Equipment and Method: Stylet Placement Confirmation: ETT inserted through vocal cords under direct vision,  positive ETCO2 and breath sounds checked- equal and bilateral Secured at: 21 cm Tube secured with: Tape Dental Injury: Teeth and Oropharynx as per pre-operative assessment       CVP

## 2014-06-26 NOTE — Op Note (Signed)
Date of surgery: 06/26/2014 Preoperative diagnosis: Left parietal acute subdural hematoma with right hemiparesis and speech difficulty Postoperative diagnosis: Left parietal acute subdural hematoma with right hemiparesis and speech difficulty Procedure: Left parietal craniotomy evacuation of subdural hematoma Surgeon: Barnett AbuHenry Skylor Hughson Anesthesia: Gen. endotracheal Indications: Dalene SeltzerSherwood Ozawa his an 80 year old individual who has been on Plavix for an aortic valve replacement. He has had a fall 2 days ago. He was well neurologically until this morning when it was noted that he had speech difficulty and some clumsiness with his right hand difficulty using a fork and a spoon. Is brought to the emergency department. A CT scan demonstrates the presence of an acute subdural hematoma on the left side in the parietal region. It was decided to watch him and during the daytime it appeared that his speech problem worsen. This afternoon a repeat CT scan showed that the hematoma had not enlarged however given the deterioration and function is advised that he undergo surgical evacuation of the hematoma.  Procedure: The patient was brought to the operating room supine on a stretcher. After the smooth induction of general endotracheal anesthesia a Foley catheter was placed and arterial line was placed a central line was placed and then the patient's head was placed in a 3 pin headrest turned slightly to the right side with a bump under the left shoulder. The scalp was shaved prepped with alcohol and DuraPrep and draped in a sterile fashion a linear incision was created from the anterior border of the tear to the vertex of the skull. The underlying skin was cauterized for hemostasis in addition to having Raney clips placed. The galea was stripped with the periosteum from the skull. The temporalis muscle and fascia was opened. Then a singular burr hole was placed high in the vertex of the skull over the parietal boss. A craniotomy  flap measuring approximately 6 cm in diameter was raised over this region. The bone was laid aside. Immediately there was discoloration of the underlying dura and a small portion near the inferior border of the opening had a dural tear that exceeded blood under modest amount of pressure. Blood clot was irrigated away from this region and the dural opening was enlarged. Significant blood clot was removed from the subdural space and exploration under the edges of the bone flap yielded more flow clot which was irrigated away and also removed with small forceps. Careful exploration in all directions around the flap was undertaken removing blood clot from each of these are regions onto peel areas of the rain there were identified small bleeding vessels. These were initially attempted to be controlled by Gelfoam soaked patties but being unsuccessful there cauterized. There is a small contusion noted in the center of the cortex near one of these bleeding vessels. Once the area was explored and no other bleeding sources were encountered and all the blood had been evacuated as best possible the dura was reapproximated and closed with 4-0 Nurolon suture. Tack up sutures were placed around the perimetry. The bone flap was then replaced and secured with 3 separate plates 1 burr hole cover and 2 dog bone plates. A piece of DuraGen was left over an open area tear on the bottom of the dura could not be sealed. The temporalis fascia was then closed with interrupted 2-0 Vicryl sutures the galea was closed with interrupted 2-0 Vicryl sutures and surgical staples were placed in the scalp. Blood loss was estimated at 200 mL. Patient was returned to the ICU intubated  stable.

## 2014-06-26 NOTE — ED Notes (Signed)
Patient transported to X-ray 

## 2014-06-26 NOTE — ED Notes (Signed)
Bed control called again due to bed placement in 4 north- sts will change to stepdown again. Admitting MD made aware of new CT results.

## 2014-06-26 NOTE — ED Provider Notes (Signed)
CSN: [id number]     Arrival date & time 06/26/14  [id number] History   First MD Initiated Contact with Patient 06/26/14 [phone]     Chief Complaint  Patient presents with  . Aphasia     (Consider location/radiation/quality/duration/timing/severity/associated sxs/prior Treatment) HPI  80 year old male brought in by EMS for aphasia. Per EMS the patient was last seen normal last night at 8 PM (12+ hours ago) by his wife. He lives at home with her. Since he woke up this morning he is been having trouble speaking, both with slurred speech as well as getting the wrong words out. He called his wife by the wrong name this morning. He fell and hit his head 2 days ago while out in the yard. He is on Plavix. He states that he does have a left-sided headache. Denies a weakness or numbness. EMS reports the slurred speech seems to be coming and going. Patient has a history of a valve replacement but denies any recent illness or infection. Has had trouble gripping things/coordination with right arm this AM.  Past Medical History  Diagnosis Date  . Hypercholesterolemia   . Benign prostatic hypertrophy   . GERD (gastroesophageal reflux disease)   . DDD (degenerative disc disease), cervical   . Tinnitus   . Multinodular goiter   . Prediabetes   . DDD (degenerative disc disease), lumbar   . Hypertension   . Pulmonary nodules   . Anxiety disorder    Past Surgical History  Procedure Laterality Date  . Aortic valve replacement  01/2014  . History of blood transfusion    . Hernia repair      6 months old   Family History  Problem Relation Age of Onset  . Congestive Heart Failure Mother    History  Substance Use Topics  . Smoking status: Former Games developermoker  . Smokeless tobacco: Never Used     Comment: QUIT IN 1970  . Alcohol Use: No    Review of Systems  Constitutional: Negative for fever.  Respiratory: Negative for cough.   Gastrointestinal: Negative for vomiting.  Neurological: Positive for speech  difficulty and headaches. Negative for weakness and numbness.  All other systems reviewed and are negative.     Allergies  Review of patient's allergies indicates not on file.  Home Medications   Prior to Admission medications   Medication Sig Start Date End Date Taking? Authorizing Provider  albuterol (PROVENTIL HFA;VENTOLIN HFA) 108 (90 BASE) MCG/ACT inhaler Inhale 2 puffs into the lungs every 6 (six) hours as needed for wheezing or shortness of breath.    Historical Provider, MD  albuterol (PROVENTIL) (2.5 MG/3ML) 0.083% nebulizer solution Take 2.5 mg by nebulization every 6 (six) hours as needed for wheezing or shortness of breath.    Historical Provider, MD  CALCIUM-VITAMIN D PO Take 600 mg by mouth daily.    Historical Provider, MD  clopidogrel (PLAVIX) 75 MG tablet Take 75 mg by mouth daily.    Historical Provider, MD  dextromethorphan-guaiFENesin (MUCINEX DM) 30-600 MG per 12 hr tablet Take 1 tablet by mouth 2 (two) times daily.    Historical Provider, MD  escitalopram (LEXAPRO) 5 MG tablet Take 5 mg by mouth daily.    Historical Provider, MD  finasteride (PROSCAR) 5 MG tablet Take 5 mg by mouth daily.    Historical Provider, MD  Fluticasone-Salmeterol (ADVAIR) 250-50 MCG/DOSE AEPB Inhale 1 puff into the lungs 2 (two) times daily.    Historical Provider, MD  ibuprofen (ADVIL,MOTRIN) 200 MG tablet  Take 200 mg by mouth every 6 (six) hours as needed.    Historical Provider, MD  LORazepam (ATIVAN) 2 MG tablet Take 2 mg by mouth daily. 1 1/2 tablet daily at bedtime    Historical Provider, MD  Multiple Vitamins-Minerals (CENTRUM SILVER PO) Take 1 tablet by mouth daily.    Historical Provider, MD  pantoprazole (PROTONIX) 20 MG tablet Take 20 mg by mouth daily.    Historical Provider, MD  Polyethylene Glycol 3350 (MIRALAX PO) Take by mouth daily. As needed    Historical Provider, MD  simvastatin (ZOCOR) 40 MG tablet Take 40 mg by mouth daily.    Historical Provider, MD  tamsulosin (FLOMAX)  0.4 MG CAPS capsule Take 0.4 mg by mouth at bedtime.    Historical Provider, MD  Vitamins-Lipotropics (LIPO-FLAVONOID PLUS PO) Take by mouth daily.    Historical Provider, MD   SpO2 95% Physical Exam  Constitutional: He is oriented to person, place, and time. He appears well-developed and well-nourished.  HENT:  Head: Normocephalic.    Right Ear: External ear normal.  Left Ear: External ear normal.  Nose: Nose normal.  Eyes: Right eye exhibits no discharge. Left eye exhibits no discharge.  Neck: Neck supple.  Cardiovascular: Normal rate, regular rhythm, normal heart sounds and intact distal pulses.   Pulmonary/Chest: Effort normal and breath sounds normal. He exhibits no tenderness, no crepitus and no deformity.    Abdominal: Soft. He exhibits no distension. There is no tenderness.  Musculoskeletal: He exhibits no edema.  Neurological: He is alert and oriented to person, place, and time. No cranial nerve deficit.  CN 2-12 grossly intact. 5/5 strength in all 4 extremities Slurred speech, as well as placing wrong words in sentences  Skin: Skin is warm and dry.  Psychiatric: His speech is slurred.  Nursing note and vitals reviewed.   ED Course  Procedures (including critical care time) Labs Review Labs Reviewed  CBC - Abnormal; Notable for the following:    Hemoglobin 12.2 (*)    HCT 36.8 (*)    RDW 16.4 (*)    Platelets 144 (*)    All other components within normal limits  COMPREHENSIVE METABOLIC PANEL - Abnormal; Notable for the following:    Glucose, Bld 111 (*)    GFR calc non Af Amer 79 (*)    All other components within normal limits  ETHANOL  PROTIME-INR  APTT  DIFFERENTIAL  URINE RAPID DRUG SCREEN (HOSP PERFORMED)  URINALYSIS, ROUTINE W REFLEX MICROSCOPIC  I-STAT TROPOININ, ED  TYPE AND SCREEN    Imaging Review Dg Chest 2 View  06/26/2014   CLINICAL DATA:  [place] 2 days ago.  Left chest injury.  EXAM: CHEST  2 VIEW  COMPARISON:  03/29/2014  FINDINGS: The  heart is upper limits of normal in size. There is tortuosity, ectasia and calcification of the thoracic aorta. An aortic valve stent is noted. There are chronic bronchitic lung changes but no acute overlying pulmonary process. No pulmonary contusion, pleural effusion or pneumothorax. The bony thorax is intact. No obvious rib or sternal fracture.  IMPRESSION: No acute cardiopulmonary findings and grossly intact bony thorax.   Electronically Signed   By: Loralie Champagne M.D.   On: 06/26/2014 09:28   Ct Head Wo Contrast  06/26/2014   CLINICAL DATA:  [place] 2 days ago and hit head. Mental status changes and slurred speech.  EXAM: CT HEAD WITHOUT CONTRAST  TECHNIQUE: Contiguous axial images were obtained from the base of the skull through  the vertex without intravenous contrast.  COMPARISON:  Head CT 02/24/2012  FINDINGS: There is a left-sided subdural hematoma involving the left temporal and parietal regions. It extends all the way to the left vertex posteriorly and has a maximal thickness of 18 mm. There is compression of the sulci but no midline shift. The ventricles are in the midline and are normal and stable in configuration. Moderate cerebral atrophy. No findings for hemispheric infarction. Stable periventricular white matter disease. The brainstem and cerebellum are grossly normal and stable. Mild atrophy.  No acute skull fractures identified. The paranasal sinuses and mastoid air cells are clear except for minimal left maxillary sinus disease.  IMPRESSION: Left-sided subdural hematoma as discussed above. Compression of the sulci but no midline shift or herniation.  These results were called by telephone at the time of interpretation on 06/26/2014 at 9:51 am to Dr. Pricilla LovelessSCOTT Melisssa Donner , who verbally acknowledged these results.   Electronically Signed   By: Loralie ChampagneMark  Gallerani M.D.   On: 06/26/2014 09:51     EKG Interpretation   Date/Time:  Friday June 26 2014 08:52:32 EST Ventricular Rate:  74 PR Interval:   157 QRS Duration: 91 QT Interval:  412 QTC Calculation: 457 R Axis:   61 Text Interpretation:  Sinus rhythm Probable anteroseptal infarct, old  Baseline wander in lead(s) V2 No old tracing to compare Confirmed by  Sophia Sperry  MD, Maysun Meditz ([id number]) on 06/26/2014 8:57:08 AM      MDM   Final diagnoses:  Subdural hematoma    Patient's symptoms are likely coming from his left subdural. This injury is approximately 48 hours old. The patient is awake and alert but having trouble speaking. He has normal strength in his arm but apparently trouble with coordination. Discussed with Dr. Danielle DessElsner, who has evaluated patient and at this time we'll hold off on surgery after discussing with family. Does not feel the platelets or other blood pressure required at this time. We'll stop Plavix. He was on Plavix for prior aortic valve replacement that is a bovine valve. Neurosurgery [facility] to find out what his anticoagulation status is for his valve. Request medicine admission, will admit to internal medicine teaching service. Started on nicardipine given his hypertension up to 180/190 systolic.    Audree CamelScott T Jerime Arif, MD 06/26/14 [id number]

## 2014-06-26 NOTE — ED Notes (Signed)
Family at bedside, pt remains aphasic.

## 2014-06-26 NOTE — ED Notes (Signed)
Rn called 4N to make them aware that pt will not be coming to floor. RN called patient placement again to clarify pt is to go to stepdown unit per admitting.

## 2014-06-26 NOTE — ED Notes (Signed)
Admitting paged 

## 2014-06-26 NOTE — ED Notes (Signed)
Flow called to switch bed to stepdown.

## 2014-06-26 NOTE — Consult Note (Signed)
Reason for Consult: Subdural hematoma Referring Physician: Dr. Dory Larsen Roy Mitchell is an 80 y.o. male.  HPI: Patient is an 80 year old right-handed individual who sustained a fall 2 days ago. He was well through last night but this morning it was noted that he had significant word finding difficulty and the patient had difficulty using a fork and spoon in his right hand. He is brought to the emergency room where CT scan was performed demonstrating the presence of an acute subdural hematoma in the left parietal region. The maximum thickness is approximately 17 mm however due to significant atrophy there is minimal mass effect and there is no evidence of shift at this time.  The patient had a transcatheter aortic valve replacement with a bovine valve prosthesis. This was done at the end of July. I had a phone conversation with the patient's cardiologist Dr. Dudley Major at [facility]. The patient has been on Plavix and Dr. Metta Clines noted that this is usually continued for a period of about 3 months after the procedure was completed the patient had a peripheral artery stent at the site of the catheterization on the left side but this should not require persistent use of Plavix. Dr. Metta Clines felt there would be little risk to stopping the Plavix at this time.    Past Medical History  Diagnosis Date  . Hypercholesterolemia   . Benign prostatic hypertrophy   . GERD (gastroesophageal reflux disease)   . DDD (degenerative disc disease), cervical   . Tinnitus   . Multinodular goiter   . Prediabetes   . DDD (degenerative disc disease), lumbar   . Hypertension   . Pulmonary nodules   . Anxiety disorder     Past Surgical History  Procedure Laterality Date  . Aortic valve replacement  01/2014  . History of blood transfusion    . Hernia repair      63 months old    Family History  Problem Relation Age of Onset  . Congestive Heart Failure Mother     Social History:   reports that he has quit smoking. He has never used smokeless tobacco. He reports that he does not drink alcohol or use illicit drugs.  Allergies:  Allergies  Allergen Reactions  . Ace Inhibitors Cough  . Penicillins Rash    Medications: I have reviewed the patient's current medications.  Results for orders placed or performed during the hospital encounter of 06/26/14 (from the past 48 hour(s))  Ethanol     Status: None   Collection Time: 06/26/14  9:55 AM  Result Value Ref Range   Alcohol, Ethyl (B) <11 0 - 11 mg/dL    Comment:        LOWEST DETECTABLE LIMIT FOR SERUM ALCOHOL IS 11 mg/dL FOR MEDICAL PURPOSES ONLY   Protime-INR     Status: None   Collection Time: 06/26/14  9:55 AM  Result Value Ref Range   Prothrombin Time 13.5 11.6 - 15.2 seconds   INR 1.02 0.00 - 1.49  APTT     Status: None   Collection Time: 06/26/14  9:55 AM  Result Value Ref Range   aPTT 29 24 - 37 seconds  CBC     Status: Abnormal   Collection Time: 06/26/14  9:55 AM  Result Value Ref Range   WBC 7.8 4.0 - 10.5 K/uL   RBC 4.49 4.22 - 5.81 MIL/uL   Hemoglobin 12.2 (L) 13.0 - 17.0 g/dL   HCT 36.8 (L) 39.0 - 52.0 %  MCV 82.0 78.0 - 100.0 fL   MCH 27.2 26.0 - 34.0 pg   MCHC 33.2 30.0 - 36.0 g/dL   RDW 16.4 (H) 11.5 - 15.5 %   Platelets 144 (L) 150 - 400 K/uL  Differential     Status: None   Collection Time: 06/26/14  9:55 AM  Result Value Ref Range   Neutrophils Relative % 74 43 - 77 %   Neutro Abs 5.8 1.7 - 7.7 K/uL   Lymphocytes Relative 17 12 - 46 %   Lymphs Abs 1.3 0.7 - 4.0 K/uL   Monocytes Relative 9 3 - 12 %   Monocytes Absolute 0.7 0.1 - 1.0 K/uL   Eosinophils Relative 1 0 - 5 %   Eosinophils Absolute 0.1 0.0 - 0.7 K/uL   Basophils Relative 1 0 - 1 %   Basophils Absolute 0.0 0.0 - 0.1 K/uL  Comprehensive metabolic panel     Status: Abnormal   Collection Time: 06/26/14  9:55 AM  Result Value Ref Range   Sodium 138 137 - 147 mEq/L   Potassium 4.0 3.7 - 5.3 mEq/L   Chloride 99 96 -  112 mEq/L   CO2 29 19 - 32 mEq/L   Glucose, Bld 111 (H) 70 - 99 mg/dL   BUN 17 6 - 23 mg/dL   Creatinine, Ser 0.76 0.50 - 1.35 mg/dL   Calcium 9.2 8.4 - 10.5 mg/dL   Total Protein 6.9 6.0 - 8.3 g/dL   Albumin 3.5 3.5 - 5.2 g/dL   AST 14 0 - 37 U/L   ALT 13 0 - 53 U/L   Alkaline Phosphatase 70 39 - 117 U/L   Total Bilirubin 0.8 0.3 - 1.2 mg/dL   GFR calc non Af Amer 79 (L) >90 mL/min   GFR calc Af Amer >90 >90 mL/min    Comment: (NOTE) The eGFR has been calculated using the CKD EPI equation. This calculation has not been validated in all clinical situations. eGFR's persistently <90 mL/min signify possible Chronic Kidney Disease.    Anion gap 10 5 - 15  Urine Drug Screen     Status: None   Collection Time: 06/26/14  9:56 AM  Result Value Ref Range   Opiates NONE DETECTED NONE DETECTED   Cocaine NONE DETECTED NONE DETECTED   Benzodiazepines NONE DETECTED NONE DETECTED   Amphetamines NONE DETECTED NONE DETECTED   Tetrahydrocannabinol NONE DETECTED NONE DETECTED   Barbiturates NONE DETECTED NONE DETECTED    Comment:        DRUG SCREEN FOR MEDICAL PURPOSES ONLY.  IF CONFIRMATION IS NEEDED FOR ANY PURPOSE, NOTIFY LAB WITHIN 5 DAYS.        LOWEST DETECTABLE LIMITS FOR URINE DRUG SCREEN Drug Class       Cutoff (ng/mL) Amphetamine      1000 Barbiturate      200 Benzodiazepine   353 Tricyclics       299 Opiates          300 Cocaine          300 THC              50   Urinalysis, Routine w reflex microscopic     Status: None   Collection Time: 06/26/14  9:56 AM  Result Value Ref Range   Color, Urine YELLOW YELLOW   APPearance CLEAR CLEAR   Specific Gravity, Urine 1.012 1.005 - 1.030   pH 7.5 5.0 - 8.0   Glucose, UA NEGATIVE NEGATIVE mg/dL  Hgb urine dipstick NEGATIVE NEGATIVE   Bilirubin Urine NEGATIVE NEGATIVE   Ketones, ur NEGATIVE NEGATIVE mg/dL   Protein, ur NEGATIVE NEGATIVE mg/dL   Urobilinogen, UA 0.2 0.0 - 1.0 mg/dL   Nitrite NEGATIVE NEGATIVE   Leukocytes, UA  NEGATIVE NEGATIVE    Comment: MICROSCOPIC NOT DONE ON URINES WITH NEGATIVE PROTEIN, BLOOD, LEUKOCYTES, NITRITE, OR GLUCOSE <1000 mg/dL.  Type and screen     Status: None (Preliminary result)   Collection Time: 06/26/14 10:00 AM  Result Value Ref Range   ABO/RH(D) O POS    Antibody Screen PENDING    Sample Expiration 06/29/2014   I-Stat Troponin, ED (not at [facility])     Status: None   Collection Time: 06/26/14 10:10 AM  Result Value Ref Range   Troponin i, poc 0.00 0.00 - 0.08 ng/mL   Comment 3            Comment: Due to the release kinetics of cTnI, a negative result within the first hours of the onset of symptoms does not rule out myocardial infarction with certainty. If myocardial infarction is still suspected, repeat the test at appropriate intervals.     Dg Chest 2 View  06/26/2014   CLINICAL DATA:  [place] 2 days ago.  Left chest injury.  EXAM: CHEST  2 VIEW  COMPARISON:  03/29/2014  FINDINGS: The heart is upper limits of normal in size. There is tortuosity, ectasia and calcification of the thoracic aorta. An aortic valve stent is noted. There are chronic bronchitic lung changes but no acute overlying pulmonary process. No pulmonary contusion, pleural effusion or pneumothorax. The bony thorax is intact. No obvious rib or sternal fracture.  IMPRESSION: No acute cardiopulmonary findings and grossly intact bony thorax.   Electronically Signed   By: Kalman Jewels M.D.   On: 06/26/2014 09:28   Ct Head Wo Contrast  06/26/2014   CLINICAL DATA:  [place] 2 days ago and hit head. Mental status changes and slurred speech.  EXAM: CT HEAD WITHOUT CONTRAST  TECHNIQUE: Contiguous axial images were obtained from the base of the skull through the vertex without intravenous contrast.  COMPARISON:  Head CT 02/24/2012  FINDINGS: There is a left-sided subdural hematoma involving the left temporal and parietal regions. It extends all the way to the left vertex posteriorly and has a maximal thickness of 18 mm.  There is compression of the sulci but no midline shift. The ventricles are in the midline and are normal and stable in configuration. Moderate cerebral atrophy. No findings for hemispheric infarction. Stable periventricular white matter disease. The brainstem and cerebellum are grossly normal and stable. Mild atrophy.  No acute skull fractures identified. The paranasal sinuses and mastoid air cells are clear except for minimal left maxillary sinus disease.  IMPRESSION: Left-sided subdural hematoma as discussed above. Compression of the sulci but no midline shift or herniation.  These results were called by telephone at the time of interpretation on 06/26/2014 at 9:51 am to Dr. Chen Gambler , who verbally acknowledged these results.   Electronically Signed   By: Kalman Jewels M.D.   On: 06/26/2014 09:51    Review of Systems  Constitutional: Negative.   HENT: Negative.   Eyes: Negative.   Respiratory:       Patient was hospitalized for recent pneumonia after he had his heart valve replacement.  Cardiovascular: Negative.   Genitourinary: Negative.   Musculoskeletal: Negative.   Skin: Negative.   Neurological:       Speech difficulty  and difficulty with coordination in right hand  Endo/Heme/Allergies:       Patient on Plavix secondary to recent aortic valve replacement done via transcatheter root  Psychiatric/Behavioral: Negative.    Blood pressure 121/60, pulse 79, temperature 97.8 F (36.6 C), temperature source Oral, resp. rate 14, height $RemoveBe'5\' 6"'KUHbHlSqh$  (1.676 m), weight 92.987 kg (205 lb), SpO2 93 %. Physical Exam  Constitutional: He is oriented to person, place, and time. He appears well-developed and well-nourished.  HENT:  Head: Normocephalic and atraumatic.  Eyes: Conjunctivae and EOM are normal. Pupils are equal, round, and reactive to light.  Neck: Normal range of motion. Neck supple.  Cardiovascular: Normal rate and regular rhythm.   Respiratory: Breath sounds normal.  GI: Soft. Bowel  sounds are normal.  Neurological: He is alert and oriented to person, place, and time.  Word finding difficulty on left side though his comprehension appears to be completely intact. He has no evidence of a cortical drift however he has had difficulties with grasping a fork and knife in his right hand today as noted by both his daughter and his wife. Gait was not tested at the current time.  Skin: Skin is warm and dry.  Psychiatric: He has a normal mood and affect. His behavior is normal.    Assessment/Plan: Left-sided acute subdural hematoma with slight mass effect but no significant shift. Patient is currently having speech difficulties with word finding difficulties and some loss of coordination in his right hand. I discussed the situation at length with the patient's daughter and his wife. It appears that the patient's daughter is a Designer, jewellery was worked on the stroke service for number of years with Dr. Leonie Man . She has an understanding of the process that is occurring and we have decided this point to observe the patient clinically. If there is further deterioration in his level of consciousness or right-sided function we may need to proceed with surgical intervention however I noted that the risks of surgery in the face of continued Plavix at this time is that the patient may recur and reaccumulate a hematoma. In that regard if the situation remained stable we may be able to "ride this process out "without surgery a follow-up CT scan should be performed in 24 hours. I will follow along clinically. I've requested that the hospitalist service admit this patient for general management of this patient's medical problems.  Nasire Reali J 06/26/2014, 12:05 PM

## 2014-06-26 NOTE — ED Notes (Signed)
Dr. Danielle DessElsner in to see pt.

## 2014-06-26 NOTE — ED Notes (Signed)
Neuro Surgrey stated they wanted pt completely NPO, this nurse asked if he wanted this nurse to do swallow screen. He stated He still wanted pt NPO.

## 2014-06-26 NOTE — H&P (Signed)
Date: 06/26/2014               Patient Name:  Roy Mitchell MRN: [id number]  DOB: [birth year] Age / Sex: 80 y.o., male   PCP: Lonie Peak, PA-C         Medical Service: Internal Medicine Teaching Service         Attending Physician: Dr. Aletta Edouard, MD    First Contact: Dr. Isabella Bowens Pager: [phone]  Second Contact: Dr. Andrey Campanile Pager: [phone]       After Hours (After 5p/  First Contact Pager: [phone]  weekends / holidays): Second Contact Pager: [phone]   Chief Complaint: Fall, aphasia  History of Present Illness: Mr. Roy Mitchell is an 80 year old man with history of HTN, HLD, prediabetes, bovine aortic valve replacement 02/11/2014 on Plavix presenting with aphasia. Wednesday afternoon he had a mechanical fall while walking on the sidewalk. He hit the left side of his head. Denies LOC. He was last noted to be functioning at baseline last night prior to going to bed. When he woke up this morning his family noted aphasia with both slurred speech and impaired expression (wrong words). Family also notes that he had trouble gripping with his R hand. No trouble noted with walking with walker. He reports dizziness, lightheadedness after his fall. Denies fevers, chills, vision changes, new cough, shortness of breath, trouble swallowing, chest pain, palpitations, nausea, vomiting, abdominal pain, diarrhea, constipation, hematochezia, melena, dysuria, hematuria, numbness.  Meds: No current facility-administered medications for this encounter.   Current Outpatient Prescriptions  Medication Sig Dispense Refill  . CALCIUM-VITAMIN D PO Take 600 mg by mouth daily.    . clopidogrel (PLAVIX) 75 MG tablet Take 75 mg by mouth daily.    Marland Kitchen escitalopram (LEXAPRO) 5 MG tablet Take 5 mg by mouth daily.    . finasteride (PROSCAR) 5 MG tablet Take 5 mg by mouth daily.    . Fluticasone-Salmeterol (ADVAIR) 250-50 MCG/DOSE AEPB Inhale 1 puff into the lungs 2 (two) times daily.    Marland Kitchen ibuprofen (ADVIL,MOTRIN)  200 MG tablet Take 200 mg by mouth every 6 (six) hours as needed.    Marland Kitchen LORazepam (ATIVAN) 2 MG tablet Take 2 mg by mouth daily. 1 1/2 tablet daily at bedtime    . Multiple Vitamins-Minerals (CENTRUM SILVER PO) Take 1 tablet by mouth daily.    . pantoprazole (PROTONIX) 20 MG tablet Take 20 mg by mouth daily.    . Polyethylene Glycol 3350 (MIRALAX PO) Take by mouth daily. As needed    . simvastatin (ZOCOR) 40 MG tablet Take 40 mg by mouth daily.    . tamsulosin (FLOMAX) 0.4 MG CAPS capsule Take 0.4 mg by mouth at bedtime.    . Vitamins-Lipotropics (LIPO-FLAVONOID PLUS PO) Take by mouth daily.    Marland Kitchen albuterol (PROVENTIL HFA;VENTOLIN HFA) 108 (90 BASE) MCG/ACT inhaler Inhale 2 puffs into the lungs every 6 (six) hours as needed for wheezing or shortness of breath.    Marland Kitchen albuterol (PROVENTIL) (2.5 MG/3ML) 0.083% nebulizer solution Take 2.5 mg by nebulization every 6 (six) hours as needed for wheezing or shortness of breath.    . dextromethorphan-guaiFENesin (MUCINEX DM) 30-600 MG per 12 hr tablet Take 1 tablet by mouth 2 (two) times daily.    . Fluticasone-Salmeterol (ADVAIR) 250-50 MCG/DOSE AEPB Inhale 1 puff into the lungs 2 (two) times daily.      Allergies: Allergies as of 06/26/2014 - Review Complete 06/26/2014  Allergen Reaction Noted  . Ace inhibitors Cough 06/26/2014  .  Penicillins Rash 06/26/2014   Past Medical History  Diagnosis Date  . Hypercholesterolemia   . Benign prostatic hypertrophy   . GERD (gastroesophageal reflux disease)   . DDD (degenerative disc disease), cervical   . Tinnitus   . Multinodular goiter   . Prediabetes   . DDD (degenerative disc disease), lumbar   . Hypertension   . Pulmonary nodules   . Anxiety disorder    Past Surgical History  Procedure Laterality Date  . Aortic valve replacement  01/2014  . History of blood transfusion    . Hernia repair      6 months old   Family History  Problem Relation Age of Onset  . Congestive Heart Failure Mother     History   Social History  . Marital Status: Married    Spouse Name: Santina Evans    Number of Children: 5  . Years of Education: 12   Occupational History  .      retired   Social History Main Topics  . Smoking status: Former Games developer  . Smokeless tobacco: Never Used     Comment: QUIT IN 1970  . Alcohol Use: No  . Drug Use: No  . Sexual Activity: Not on file   Other Topics Concern  . Not on file   Social History Narrative   Patient consumes caffiene(candy)    Review of Systems: Constitutional: no fevers/chills Eyes: no vision changes Ears, nose, mouth, throat, and face: +chronic cough Respiratory: no shortness of breath Cardiovascular: no chest pain Gastrointestinal: no nausea/vomiting, no abdominal pain, no constipation, no diarrhea Genitourinary: no dysuria, no hematuria Integument: no rash Hematologic/lymphatic: no edema Musculoskeletal: no arthralgias, no myalgias Neurological: occasional paresthesias in fingers/toes, +decreased strength in R hand grip  Physical Exam: Blood pressure 179/84, pulse 74, temperature 97.8 F (36.6 C), temperature source Oral, resp. rate 20, height 5\' 6"  (1.676 m), weight 205 lb (92.987 kg), SpO2 94 %. General Apperance: NAD Head: Normocephalic, abrasion with ecchymosis L frontal region of head Eyes: PERRL, EOMI, anicteric sclera Ears: Normal external ear canal Nose: Nares normal, septum midline, mucosa normal Throat: Lips, mucosa and tongue normal  Neck: Supple, trachea midline Back: No tenderness or bony abnormality  Lungs: Clear to auscultation bilaterally. No wheezes, rhonchi or rales. Breathing comfortably Chest Wall: Nontender, no deformity Heart: Regular rate and rhythm, no murmur/rub/gallop Abdomen: Soft, nontender, nondistended, no rebound/guarding Extremities: Normal, atraumatic, warm and well perfused, no edema Pulses: 2+ throughout Skin: No rashes or lesions Neurologic: Alert and oriented x 3. Thought content  appropriate with intact comprehension. Speech without slurring but inappropriate words used. Some trouble following commands. CNII-XII intact. 4/5 R hand grip. Normal strength and sensation otherwise.  Lab results: Basic Metabolic Panel:  Recent Labs  16/10/96 0955  NA 138  K 4.0  CL 99  CO2 29  GLUCOSE 111*  BUN 17  CREATININE 0.76  CALCIUM 9.2   Liver Function Tests:  Recent Labs  06/26/14 0955  AST 14  ALT 13  ALKPHOS 70  BILITOT 0.8  PROT 6.9  ALBUMIN 3.5   CBC:  Recent Labs  06/26/14 0955  WBC 7.8  NEUTROABS 5.8  HGB 12.2*  HCT 36.8*  MCV 82.0  PLT 144*   Coagulation:  Recent Labs  06/26/14 0955  LABPROT 13.5  INR 1.02   Urine Drug Screen: Drugs of Abuse     Component Value Date/Time   LABOPIA NONE DETECTED 06/26/2014 0956   COCAINSCRNUR NONE DETECTED 06/26/2014 0956   LABBENZ NONE  DETECTED 06/26/2014 0956   AMPHETMU NONE DETECTED 06/26/2014 0956   THCU NONE DETECTED 06/26/2014 0956   LABBARB NONE DETECTED 06/26/2014 0956    Alcohol Level:  Recent Labs  06/26/14 0955  ETH <11   Urinalysis:  Recent Labs  06/26/14 0956  COLORURINE YELLOW  LABSPEC 1.012  PHURINE 7.5  GLUCOSEU NEGATIVE  HGBUR NEGATIVE  BILIRUBINUR NEGATIVE  KETONESUR NEGATIVE  PROTEINUR NEGATIVE  UROBILINOGEN 0.2  NITRITE NEGATIVE  LEUKOCYTESUR NEGATIVE    Imaging results:  Dg Chest 2 View  06/26/2014   CLINICAL DATA:  Larey SeatFell 2 days ago.  Left chest injury.  EXAM: CHEST  2 VIEW  COMPARISON:  03/29/2014  FINDINGS: The heart is upper limits of normal in size. There is tortuosity, ectasia and calcification of the thoracic aorta. An aortic valve stent is noted. There are chronic bronchitic lung changes but no acute overlying pulmonary process. No pulmonary contusion, pleural effusion or pneumothorax. The bony thorax is intact. No obvious rib or sternal fracture.  IMPRESSION: No acute cardiopulmonary findings and grossly intact bony thorax.   Electronically Signed    By: Loralie ChampagneMark  Gallerani M.D.   On: 06/26/2014 09:28   Ct Head Wo Contrast  06/26/2014   CLINICAL DATA:  Larey SeatFell 2 days ago and hit head. Mental status changes and slurred speech.  EXAM: CT HEAD WITHOUT CONTRAST  TECHNIQUE: Contiguous axial images were obtained from the base of the skull through the vertex without intravenous contrast.  COMPARISON:  Head CT 02/24/2012  FINDINGS: There is a left-sided subdural hematoma involving the left temporal and parietal regions. It extends all the way to the left vertex posteriorly and has a maximal thickness of 18 mm. There is compression of the sulci but no midline shift. The ventricles are in the midline and are normal and stable in configuration. Moderate cerebral atrophy. No findings for hemispheric infarction. Stable periventricular white matter disease. The brainstem and cerebellum are grossly normal and stable. Mild atrophy.  No acute skull fractures identified. The paranasal sinuses and mastoid air cells are clear except for minimal left maxillary sinus disease.  IMPRESSION: Left-sided subdural hematoma as discussed above. Compression of the sulci but no midline shift or herniation.  These results were called by telephone at the time of interpretation on 06/26/2014 at 9:51 am to Dr. Pricilla LovelessSCOTT GOLDSTON , who verbally acknowledged these results.   Electronically Signed   By: Loralie ChampagneMark  Gallerani M.D.   On: 06/26/2014 09:51    Other results: EKG: Normal sinus rhythm, no T wave inversions, no ST elevation/depression. No prior EKG for comparison.  Assessment & Plan by Problem: Principal Problem:   SDH (subdural hematoma) Active Problems:   Abnormality of gait   HTN (hypertension)   Anxiety and depression  Subdural hematoma: CT head with left sided subdural hematoma involving left temporal and parietal regions and maximal thickness of 18mm. Compression of sulci but no midline shift or herniation. Neurosurgery was consulted. Following discussion with family, opted for  observation. May need to proceed to surgical intervention if there is clinical decline. -BP control -Neurochecks Q2hr -Repeat head CT in 24 hours -Hold Plavix  HTN: not on antihypertensives at home. BP 193/95 on arrival to ED. Started on nicardipine gtt in ED. -Continue nicardipine gtt.  COPD: -Albuterol neb Q6hr prn -Continue home Advair as Dulera 100-325mcg 2 puff BID  GERD: -protonix 40mg  IV daily  BPH: -hold finasteride 5mg  daily -hold home tamsulosin 0.4mg  QHS  Anxiety: -hold home Lexapro 5mg  daily -hold home Ativan  FEN: -NPO  DVT ppx: SCDs  Dispo: Disposition is deferred at this time, awaiting improvement of current medical problems. Anticipated discharge in approximately 2-3 day(s).   The patient does have a current PCP Lonie Peak(Nathan Conroy, PA-C) and [facility] after [facility].  The patient does not have transportation limitations that hinder transportation to clinic appointments.  Signed: Griffin BasilJennifer Krall, MD 06/26/2014, 11:37 AM

## 2014-06-26 NOTE — ED Notes (Signed)
RN transported pt to CT and back without incident. Vitals signs WNL. Pt able to respond "bad" when RN asked "how are you?" all other speech is unintelligble

## 2014-06-27 ENCOUNTER — Inpatient Hospital Stay (HOSPITAL_COMMUNITY): Payer: Medicare Other

## 2014-06-27 DIAGNOSIS — J9601 Acute respiratory failure with hypoxia: Secondary | ICD-10-CM

## 2014-06-27 DIAGNOSIS — I6200 Nontraumatic subdural hemorrhage, unspecified: Secondary | ICD-10-CM

## 2014-06-27 DIAGNOSIS — J9600 Acute respiratory failure, unspecified whether with hypoxia or hypercapnia: Secondary | ICD-10-CM | POA: Insufficient documentation

## 2014-06-27 LAB — BASIC METABOLIC PANEL
Anion gap: 13 (ref 5–15)
BUN: 14 mg/dL (ref 6–23)
CO2: 23 meq/L (ref 19–32)
Calcium: 8.6 mg/dL (ref 8.4–10.5)
Chloride: 101 mEq/L (ref 96–112)
Creatinine, Ser: 0.64 mg/dL (ref 0.50–1.35)
GFR calc Af Amer: >90 mL/min (ref >90)
GFR, EST NON AFRICAN AMERICAN: 85 mL/min — AB (ref >90)
Glucose, Bld: 147 mg/dL — ABNORMAL HIGH (ref 70–99)
POTASSIUM: 3.9 meq/L (ref 3.7–5.3)
SODIUM: 137 meq/L (ref 137–147)

## 2014-06-27 LAB — CBC WITH DIFFERENTIAL/PLATELET
Basophils Absolute: 0 10*3/uL (ref 0.0–0.1)
Basophils Relative: 0 % (ref 0–1)
EOS ABS: 0 10*3/uL (ref 0.0–0.7)
Eosinophils Relative: 0 % (ref 0–5)
HCT: 34.8 % — ABNORMAL LOW (ref 39.0–52.0)
Hemoglobin: 11.1 g/dL — ABNORMAL LOW (ref 13.0–17.0)
LYMPHS ABS: 1.1 10*3/uL (ref 0.7–4.0)
LYMPHS PCT: 12 % (ref 12–46)
MCH: 25.8 pg — ABNORMAL LOW (ref 26.0–34.0)
MCHC: 31.9 g/dL (ref 30.0–36.0)
MCV: 80.9 fL (ref 78.0–100.0)
Monocytes Absolute: 0.9 10*3/uL (ref 0.1–1.0)
Monocytes Relative: 11 % (ref 3–12)
NEUTROS ABS: 6.8 10*3/uL (ref 1.7–7.7)
NEUTROS PCT: 77 % (ref 43–77)
PLATELETS: 139 10*3/uL — AB (ref 150–400)
RBC: 4.3 MIL/uL (ref 4.22–5.81)
RDW: 16.5 % — ABNORMAL HIGH (ref 11.5–15.5)
WBC: 8.9 10*3/uL (ref 4.0–10.5)

## 2014-06-27 LAB — BLOOD GAS, ARTERIAL
Acid-base deficit: 0.8 mmol/L (ref 0.0–2.0)
Bicarbonate: 23.7 mEq/L (ref 20.0–24.0)
DRAWN BY: 41308
FIO2: 0.4 %
LHR: 14 {breaths}/min
MECHVT: 510 mL
O2 Saturation: 98.5 %
PEEP: 5 cmH2O
PO2 ART: 122 mmHg — AB (ref 80.0–100.0)
Patient temperature: 98.6
TCO2: 25 mmol/L (ref 0–100)
pCO2 arterial: 41.8 mmHg (ref 35.0–45.0)
pH, Arterial: 7.372 (ref 7.350–7.450)

## 2014-06-27 LAB — MRSA PCR SCREENING: MRSA by PCR: NEGATIVE

## 2014-06-27 LAB — TRIGLYCERIDES: TRIGLYCERIDES: 84 mg/dL (ref <150)

## 2014-06-27 MED ORDER — PROMETHAZINE HCL 25 MG/ML IJ SOLN: 12.5000 mg | INTRAMUSCULAR | Status: DC | PRN

## 2014-06-27 NOTE — Progress Notes (Signed)
INITIAL NUTRITION ASSESSMENT  DOCUMENTATION CODES Per approved criteria  -Obesity Unspecified   INTERVENTION: As warranted: Initiate Vital High Protein @ 20 ml/hr via NG/OG and increase by 10 ml every 4 hours to goal rate of 50 ml/hr.   30 ml Prostat BID.    Tube feeding regimen provides 1400 kcal (82% of needs, 21 kcal/kgIBW), 135 grams of protein, and 1003 ml of H2O.   RD to continue to monitor  NUTRITION DIAGNOSIS: Inadequate oral intake related to inability to eat as evidenced by NPO status.   Goal: Enteral nutrition to provide 60-70% of estimated calorie needs (22-25 kcals/kg ideal body weight) and 100% of estimated protein needs, based on ASPEN guidelines for hypocaloric, high protein feeding in critically ill obese individuals   Monitor:  TF order vs diet order, total protein/energy intake, labs, weights  Reason for Assessment: Ventilated pt  80 y.o. male  Admitting Dx: SDH (subdural hematoma)  ASSESSMENT: 80 y/o man with vascular disease, s/p AVR July of '15 (on plavix) who presented two days after a fall in which he hit his head but without LOC. The next morning he had some slurring of speech and word finding difficulty and was brought to the ED where he was found to have a subdural hematoma. Follow-up CT was negative for extension of the hematoma, but his exam declined, so he was taken to the OR for decompression with clot evacuation   -Pt with one episode vomitus during admit per MD -Weight appears stable per previous medical records (+9 lb in two months). Denied changes in appetite/wt per RN nutrition screen.  Family having intimate discussion during time of RD assessment, will follow up to assess additional food/nutrition hx as able. -Per discussion with RN, pt very sleepy post op, with plan to initiate TF on 12/13 if unable to extubate -CBG elevated but within goal of < 150 mg/dL -Patient is currently intubated on ventilator support MV: 8 L/min Temp (24hrs),  Avg:98.9 F (37.2 C), Min:97.9 F (36.6 C), Max:99.8 F (37.7 C)  Propofol: 0 ml/hr - stopped   Height: Ht Readings from Last 1 Encounters:  06/26/14 5\' 6"  (1.676 m)    Weight: Wt Readings from Last 1 Encounters:  06/27/14 206 lb 12.7 oz (93.8 kg)    Ideal Body Weight: 142 lb  % Ideal Body Weight: 145%  Wt Readings from Last 10 Encounters:  06/27/14 206 lb 12.7 oz (93.8 kg)  04/30/14 197 lb 8 oz (89.585 kg)    Usual Body Weight: ~200 lb per med records  % Usual Body Weight: 100%  BMI:  Body mass index is 33.39 kg/(m^2).  Estimated Nutritional Needs: Kcal: 1731 (1050-1250 kcal = 60-70% est kcal needs) Protein: >/= 130 gram Fluid: >/= 1800 ml daily  Skin: surgical incision on head  Diet Order: Diet NPO time specified  EDUCATION NEEDS: -No education needs identified at this time   Intake/Output Summary (Last 24 hours) at 06/27/14 1141 Last data filed at 06/27/14 1000  Gross per 24 hour  Intake 1603.25 ml  Output   1225 ml  Net 378.25 ml    Last BM: PTA   Labs:   Recent Labs Lab 06/26/14 0955 06/27/14 0401  NA 138 137  K 4.0 3.9  CL 99 101  CO2 29 23  BUN 17 14  CREATININE 0.76 0.64  CALCIUM 9.2 8.6  GLUCOSE 111* 147*    CBG (last 3)  No results for input(s): GLUCAP in the last 72 hours.  Scheduled Meds: .  antiseptic oral rinse  7 mL Mouth Rinse QID  . chlorhexidine  15 mL Mouth Rinse BID  . levETIRAcetam  500 mg Intravenous Q12H  . mometasone-formoterol  2 puff Inhalation BID  . pantoprazole (PROTONIX) IV  40 mg Intravenous Q24H    Continuous Infusions: . sodium chloride 10 mL/hr at 06/27/14 0953  . propofol Stopped (06/27/14 0815)    Past Medical History  Diagnosis Date  . Hypercholesterolemia   . Benign prostatic hypertrophy   . GERD (gastroesophageal reflux disease)   . DDD (degenerative disc disease), cervical   . Tinnitus   . Multinodular goiter   . Prediabetes   . DDD (degenerative disc disease), lumbar   .  Hypertension   . Pulmonary nodules   . Anxiety disorder     Past Surgical History  Procedure Laterality Date  . Aortic valve replacement  01/2014  . History of blood transfusion    . Hernia repair      746 months old    Lloyd HugerSarah F Azuri Bozard MS RD LDN Clinical Dietitian [phone]

## 2014-06-27 NOTE — Significant Event (Signed)
1500pm-went to CT, traveled via bed, on portable telemetry, on ventilator with RRT, transport, and myself. VS stable throughout. After being settled back in room, patient vomited-bile, undigested food particles. RN suctioned patient's airway and suctioned orally. Mouth care done. This is the second time patient vomit today. Spoke with Dr. Delford FieldWright who reviewed CT, and gave order for OG placement and phenergan. RN had already given zofran.   OG placed without complication and placed on intermittent low wall suction. Updated patient's spouse and family members who just arrived to unit.

## 2014-06-27 NOTE — Consult Note (Signed)
PULMONARY / CRITICAL CARE MEDICINE HISTORY AND PHYSICAL EXAMINATION   Name: Roy Mitchell MRN: [id number] DOB: July 21, 1923    ADMISSION DATE:  06/26/2014  PRIMARY SERVICE: PCCM  CHIEF COMPLAINT:  Aphasia after recent fall.  BRIEF PATIENT DESCRIPTION: 80 y/o man on plavix for recent AVR (unknown if mechanical or bioprosthetic) s/p fall with symptomatic subdural hematoma.  SIGNIFICANT EVENTS / STUDIES:  OR for decompression / clot evacuation 06/26/14  LINES / TUBES: 7.5 mm ETT - 06/26/14  CULTURES: None  ANTIBIOTICS: Vanocmycin (ppx 06/26/14)  HISTORY OF PRESENT ILLNESS:    80 y/o man with vascular disease, s/p AVR July of '15 (on plavix) who presented two days after a fall in which he hit his head but without LOC. The next morning he had some slurring of speech and word finding difficulty and was brought to the ED where he was found to have a subdural hematoma. Follow-up CT was negative for extension of the hematoma, but his exam declined, so he was taken to the OR for decompression with clot evacuation. This was performed without difficulty. He was kept intubated and PCCM was called for admission.  PAST MEDICAL HISTORY :  Past Medical History  Diagnosis Date  . Hypercholesterolemia   . Benign prostatic hypertrophy   . GERD (gastroesophageal reflux disease)   . DDD (degenerative disc disease), cervical   . Tinnitus   . Multinodular goiter   . Prediabetes   . DDD (degenerative disc disease), lumbar   . Hypertension   . Pulmonary nodules   . Anxiety disorder    Past Surgical History  Procedure Laterality Date  . Aortic valve replacement  01/2014  . History of blood transfusion    . Hernia repair      6 months old   Prior to Admission medications   Medication Sig Start Date End Date Taking? Authorizing Provider  CALCIUM-VITAMIN D PO Take 600 mg by mouth daily.   Yes Historical Provider, MD  clopidogrel (PLAVIX) 75 MG tablet Take 75 mg by mouth daily.   Yes Historical  Provider, MD  escitalopram (LEXAPRO) 5 MG tablet Take 5 mg by mouth daily.   Yes Historical Provider, MD  finasteride (PROSCAR) 5 MG tablet Take 5 mg by mouth daily.   Yes Historical Provider, MD  Fluticasone-Salmeterol (ADVAIR) 250-50 MCG/DOSE AEPB Inhale 1 puff into the lungs 2 (two) times daily.   Yes Historical Provider, MD  ibuprofen (ADVIL,MOTRIN) 200 MG tablet Take 200 mg by mouth every 6 (six) hours as needed.   Yes Historical Provider, MD  LORazepam (ATIVAN) 2 MG tablet Take 2 mg by mouth daily. 1 1/2 tablet daily at bedtime   Yes Historical Provider, MD  Multiple Vitamins-Minerals (CENTRUM SILVER PO) Take 1 tablet by mouth daily.   Yes Historical Provider, MD  pantoprazole (PROTONIX) 20 MG tablet Take 20 mg by mouth daily.   Yes Historical Provider, MD  Polyethylene Glycol 3350 (MIRALAX PO) Take by mouth daily. As needed   Yes Historical Provider, MD  simvastatin (ZOCOR) 40 MG tablet Take 40 mg by mouth daily.   Yes Historical Provider, MD  tamsulosin (FLOMAX) 0.4 MG CAPS capsule Take 0.4 mg by mouth at bedtime.   Yes Historical Provider, MD  Vitamins-Lipotropics (LIPO-FLAVONOID PLUS PO) Take by mouth daily.   Yes Historical Provider, MD  albuterol (PROVENTIL HFA;VENTOLIN HFA) 108 (90 BASE) MCG/ACT inhaler Inhale 2 puffs into the lungs every 6 (six) hours as needed for wheezing or shortness of breath.    Historical  Provider, MD  albuterol (PROVENTIL) (2.5 MG/3ML) 0.083% nebulizer solution Take 2.5 mg by nebulization every 6 (six) hours as needed for wheezing or shortness of breath.    Historical Provider, MD  dextromethorphan-guaiFENesin (MUCINEX DM) 30-600 MG per 12 hr tablet Take 1 tablet by mouth 2 (two) times daily.    Historical Provider, MD  Fluticasone-Salmeterol (ADVAIR) 250-50 MCG/DOSE AEPB Inhale 1 puff into the lungs 2 (two) times daily.    Historical Provider, MD   Allergies  Allergen Reactions  . Ace Inhibitors Cough  . Penicillins Rash    FAMILY HISTORY:  Family  History  Problem Relation Age of Onset  . Congestive Heart Failure Mother    SOCIAL HISTORY:  reports that he has quit smoking. He has never used smokeless tobacco. He reports that he does not drink alcohol or use illicit drugs.  REVIEW OF SYSTEMS: unable to   SUBJECTIVE:   VITAL SIGNS: Temp:  [97.8 F (36.6 C)-97.9 F (36.6 C)] 97.9 F (36.6 C) (12/11 2348) Pulse Rate:  [62-93] 73 (12/12 0006) Resp:  [13-26] 16 (12/12 0006) BP: (110-195)/(41-95) 155/70 mmHg (12/12 0006) SpO2:  [91 %-100 %] 100 % (12/12 0006) Arterial Line BP: (120-159)/(56-72) 136/62 mmHg (12/11 2330) FiO2 (%):  [21 %-60 %] 40 % (12/12 0006) Weight:  [205 lb (92.987 kg)] 205 lb (92.987 kg) (12/11 0851) HEMODYNAMICS:   VENTILATOR SETTINGS: Vent Mode:  [-] PRVC FiO2 (%):  [21 %-60 %] 40 % Set Rate:  [14 bmp] 14 bmp Vt Set:  [510 mL] 510 mL PEEP:  [5 cmH20] 5 cmH20 Plateau Pressure:  [12 cmH20-15 cmH20] 12 cmH20 INTAKE / OUTPUT: Intake/Output      12/11 0701 - 12/12 0700   I.V. (mL/kg) 867.6 (9.3)   IV Piggyback 100   Total Intake(mL/kg) 967.6 (10.4)   Urine (mL/kg/hr) 375   Blood 200   Total Output 575   Net +392.6         PHYSICAL EXAMINATION: General:  Elderly man, intubated and sedated  Neuro:  No spontaneous eye opening, does not follow commands. HEENT:  MMM Neck: R IJ CVC in place. Cardiovascular:  No M/R/G Lungs:  CTAB Abdomen:  Non-distended Musculoskeletal:  No deformities. Skin:  No obvious breakdown  LABS:  CBC  Recent Labs Lab 06/26/14 0955  WBC 7.8  HGB 12.2*  HCT 36.8*  PLT 144*   Coag's  Recent Labs Lab 06/26/14 0955  APTT 29  INR 1.02   BMET  Recent Labs Lab 06/26/14 0955  NA 138  K 4.0  CL 99  CO2 29  BUN 17  CREATININE 0.76  GLUCOSE 111*   Electrolytes  Recent Labs Lab 06/26/14 0955  CALCIUM 9.2   Sepsis Markers No results for input(s): LATICACIDVEN, PROCALCITON, O2SATVEN in the last 168 hours. ABG No results for input(s): PHART,  PCO2ART, PO2ART in the last 168 hours. Liver Enzymes  Recent Labs Lab 06/26/14 0955  AST 14  ALT 13  ALKPHOS 70  BILITOT 0.8  ALBUMIN 3.5   Cardiac Enzymes No results for input(s): TROPONINI, PROBNP in the last 168 hours. Glucose No results for input(s): GLUCAP in the last 168 hours.  Imaging Dg Chest 2 View  06/26/2014   CLINICAL DATA:  Larey Seat 2 days ago.  Left chest injury.  EXAM: CHEST  2 VIEW  COMPARISON:  03/29/2014  FINDINGS: The heart is upper limits of normal in size. There is tortuosity, ectasia and calcification of the thoracic aorta. An aortic valve stent is noted. There  are chronic bronchitic lung changes but no acute overlying pulmonary process. No pulmonary contusion, pleural effusion or pneumothorax. The bony thorax is intact. No obvious rib or sternal fracture.  IMPRESSION: No acute cardiopulmonary findings and grossly intact bony thorax.   Electronically Signed   By: Loralie ChampagneMark  Gallerani M.D.   On: 06/26/2014 09:28   Ct Head Wo Contrast  06/26/2014   CLINICAL DATA:  Subdural hematoma. Fall 2 days ago. Worsening neurologic exam. Stroke like symptoms.  EXAM: CT HEAD WITHOUT CONTRAST  TECHNIQUE: Contiguous axial images were obtained from the base of the skull through the vertex without intravenous contrast.  COMPARISON:  CT head earlier today  FINDINGS: Subdural hemorrhage in the left parietal lobe is unchanged. This measures up to 9.5 mm in the mid left parietal lobe and is thicker over the convexity however this is a less accurate area for measurement. Small interhemispheric subdural hematoma along the falx posteriorly also is unchanged.  Ventricle size is normal. No midline shift. No subarachnoid or parenchymal hemorrhage.  Chronic microvascular ischemic changes in the white matter. No acute infarct.  Negative for skull fracture. Mucosal edema in the paranasal sinuses.  IMPRESSION: Left parietal subdural hematoma is unchanged in size. Small interhemispheric subdural hematoma also  unchanged. No shift of the midline structures.  No acute infarct.   Electronically Signed   By: Marlan Palauharles  Clark M.D.   On: 06/26/2014 15:42   Ct Head Wo Contrast  06/26/2014   CLINICAL DATA:  Larey SeatFell 2 days ago and hit head. Mental status changes and slurred speech.  EXAM: CT HEAD WITHOUT CONTRAST  TECHNIQUE: Contiguous axial images were obtained from the base of the skull through the vertex without intravenous contrast.  COMPARISON:  Head CT 02/24/2012  FINDINGS: There is a left-sided subdural hematoma involving the left temporal and parietal regions. It extends all the way to the left vertex posteriorly and has a maximal thickness of 18 mm. There is compression of the sulci but no midline shift. The ventricles are in the midline and are normal and stable in configuration. Moderate cerebral atrophy. No findings for hemispheric infarction. Stable periventricular white matter disease. The brainstem and cerebellum are grossly normal and stable. Mild atrophy.  No acute skull fractures identified. The paranasal sinuses and mastoid air cells are clear except for minimal left maxillary sinus disease.  IMPRESSION: Left-sided subdural hematoma as discussed above. Compression of the sulci but no midline shift or herniation.  These results were called by telephone at the time of interpretation on 06/26/2014 at 9:51 am to Dr. Pricilla LovelessSCOTT GOLDSTON , who verbally acknowledged these results.   Electronically Signed   By: Loralie ChampagneMark  Gallerani M.D.   On: 06/26/2014 09:51    EKG: NSR CXR: Hypoinspiratory with bibasilar atelectasis, possible trace bilateral pleural effusions.  ASSESSMENT / PLAN:  Principal Problem:   SDH (subdural hematoma) Active Problems:   Abnormality of gait   HTN (hypertension)   Anxiety and depression   PULMONARY A: Respiratory failure requiring mechanical ventilation. P:   Will maintain on PRVC tonight, change to PS and SBT tomorrow.  CARDIOVASCULAR A: Recent AVR P:   Holding plavix given CNS  bleed - will need to discuss with NSGY when this can safely be restarted.  RENAL A: Normal renal function, no other metabolic issues Will monitor for SIADH / CSW P:   --  GASTROINTESTINAL A: NPO P:   For possible extubation.  HEMATOLOGIC A: Need for Antiplatelet therapy, now w/ CNS hemorrage P:   Risk /  benefit of holding vs. Restarting will need to be further investigated. Will discuss with neurosurgery.  INFECTIOUS A: No acute issues. P:    ENDOCRINE A: No evidence of DM P:   --  NEUROLOGIC A: Subdural hematoma Mild essential tremor P:   Defer to neurosurgery for specific recommendations re: additional management. Will likely be supportive.  BEST PRACTICE / DISPOSITION Level of Care:  ICU Primary Service:  PCCM Consultants:  NSGY Code Status:  Full Diet:  NPO DVT Px:  Contraindicated given CNS bleed GI Px:  PPI Skin Integrity:  Intact (defer to RN documentation) Social / Family:  No available; is now middle of night.  TODAY'S SUMMARY: 80 y/o man with subdural hematoma after falling while on plavix. Successful clot evacuation.  I have personally obtained a history, examined the patient, evaluated laboratory and imaging results, formulated the assessment and plan and placed orders.  CRITICAL CARE: The patient is critically ill with multiple organ systems failure and requires high complexity decision making for assessment and support, frequent evaluation and titration of therapies, application of advanced monitoring technologies and extensive interpretation of multiple databases. Critical Care Time devoted to patient care services described in this note is 45 minutes.   Jamie KatoAaron Ria Redcay, MD Pulmonary and Critical Care Medicine [facility] Pager: [phone]   06/27/2014, 12:21 AM

## 2014-06-27 NOTE — Progress Notes (Signed)
eLink Physician-Brief Progress Note Patient Name: Roy Mitchell DOB: 29-May-1924 MRN: [id number]   Date of Service  06/27/2014  HPI/Events of Note  Refractory emesis  eICU Interventions  OG to LIS, prn phenegran      Intervention Category Minor Interventions: Routine modifications to care plan (e.g. PRN medications for pain, fever)  Shan LevansPatrick Rudie Rikard 06/27/2014, 3:48 PM

## 2014-06-27 NOTE — Progress Notes (Signed)
PULMONARY / CRITICAL CARE MEDICINE HISTORY AND PHYSICAL EXAMINATION   Name: Roy Mitchell MRN: [id number] DOB: [birth year]    ADMISSION DATE:  06/26/2014  PRIMARY SERVICE: PCCM  CHIEF COMPLAINT:  Aphasia after recent fall.  BRIEF PATIENT DESCRIPTION:    80 y/o man with vascular disease, s/p AVR July of '15 (on plavix) who presented two days after a fall in which he hit his head but without LOC. The next morning he had some slurring of speech and word finding difficulty and was brought to the ED where he was found to have a subdural hematoma. Follow-up CT was negative for extension of the hematoma, but his exam declined, so he was taken to the OR for decompression with clot evacuation. This was performed without difficulty. He was kept intubated and PCCM was called for admission.   SIGNIFICANT EVENTS / STUDIES:  OR for decompression / clot evacuation 06/26/14  LINES / TUBES: 7.5 mm ETT - 06/26/14  CULTURES: None  ANTIBIOTICS: Vanocmycin (ppx 06/26/14)    SUBJECTIVE:   06/27/14 - Family at bedside. 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 Doing SBt but appears a bit drowsy and also vomited x 1  VITAL SIGNS: Temp:  [97.9 F (36.6 C)-99.1 F (37.3 C)] 98.6 F (37 C) (12/12 0800) Pulse Rate:  [62-93] 74 (12/12 0900) Resp:  [13-27] 27 (12/12 0900) BP: (110-195)/(41-86) 165/80 mmHg (12/12 0900) SpO2:  [91 %-100 %] 100 % (12/12 0900) Arterial Line BP: (120-194)/(56-83) 187/81 mmHg (12/12 0900) FiO2 (%):  [40 %-60 %] 40 % (12/12 0900) Weight:  [93.8 kg (206 lb 12.7 oz)] 93.8 kg (206 lb 12.7 oz) (12/12 0351) HEMODYNAMICS:   VENTILATOR SETTINGS: Vent Mode:  [-] CPAP;PSV FiO2 (%):  [40 %-60 %] 40 % Set Rate:  [14 bmp] 14 bmp Vt Set:  [510 mL] 510 mL PEEP:  [5 cmH20] 5 cmH20 Pressure Support:  [8 cmH20] 8 cmH20 Plateau Pressure:  [11 cmH20-15 cmH20] 11 cmH20 INTAKE / OUTPUT: Intake/Output      12/11 0701 - 12/12 0700 12/12 0701 - 12/13 0700   I.V. (mL/kg) 1349.5  (14.4) 108.4 (1.2)   IV Piggyback 100    Total Intake(mL/kg) 1449.5 (15.5) 108.4 (1.2)   Urine (mL/kg/hr) 755 210 (0.9)   Blood 200    Total Output 955 210   Net +494.5 -101.6        Emesis Occurrence  1 x     PHYSICAL EXAMINATION: General:  Elderly man, intubated and sedated . Looks deconditioned Neuro:  RASS -1/-2. Follows commands, Gag + HEENT:  MMM Neck: R IJ CVC in place. Cardiovascular:  No M/R/G Lungs:  CTAB Abdomen:  Non-distended Musculoskeletal:  No deformities. Skin:  No obvious breakdown  LABS: PULMONARY  Recent Labs Lab 06/27/14 0020  PHART 7.372  PCO2ART 41.8  PO2ART 122.0*  HCO3 23.7  TCO2 25.0  O2SAT 98.5    CBC  Recent Labs Lab 06/26/14 0955 06/27/14 0401  HGB 12.2* 11.1*  HCT 36.8* 34.8*  WBC 7.8 8.9  PLT 144* 139*    COAGULATION  Recent Labs Lab 06/26/14 0955  INR 1.02    CARDIAC  No results for input(s): TROPONINI in the last 168 hours. No results for input(s): PROBNP in the last 168 hours.   CHEMISTRY  Recent Labs Lab 06/26/14 0955 06/27/14 0401  NA 138 137  K 4.0 3.9  CL 99 101  CO2 29 23  GLUCOSE 111* 147*  BUN 17 14  CREATININE 0.76 0.64  CALCIUM 9.2 8.6   Estimated Creatinine Clearance: 68.4 mL/min (by C-G formula based on Cr of 0.64).   LIVER  Recent Labs Lab 06/26/14 0955  AST 14  ALT 13  ALKPHOS 70  BILITOT 0.8  PROT 6.9  ALBUMIN 3.5  INR 1.02     INFECTIOUS No results for input(s): LATICACIDVEN, PROCALCITON in the last 168 hours.   ENDOCRINE CBG (last 3)  No results for input(s): GLUCAP in the last 72 hours.       IMAGING x48h Dg Chest 2 View  06/26/2014   CLINICAL DATA:  Larey SeatFell 2 days ago.  Left chest injury.  EXAM: CHEST  2 VIEW  COMPARISON:  03/29/2014  FINDINGS: The heart is upper limits of normal in size. There is tortuosity, ectasia and calcification of the thoracic aorta. An aortic valve stent is noted. There are chronic bronchitic lung changes but no acute overlying  pulmonary process. No pulmonary contusion, pleural effusion or pneumothorax. The bony thorax is intact. No obvious rib or sternal fracture.  IMPRESSION: No acute cardiopulmonary findings and grossly intact bony thorax.   Electronically Signed   By: Loralie ChampagneMark  Gallerani M.D.   On: 06/26/2014 09:28   Ct Head Wo Contrast  06/26/2014   CLINICAL DATA:  Subdural hematoma. Fall 2 days ago. Worsening neurologic exam. Stroke like symptoms.  EXAM: CT HEAD WITHOUT CONTRAST  TECHNIQUE: Contiguous axial images were obtained from the base of the skull through the vertex without intravenous contrast.  COMPARISON:  CT head earlier today  FINDINGS: Subdural hemorrhage in the left parietal lobe is unchanged. This measures up to 9.5 mm in the mid left parietal lobe and is thicker over the convexity however this is a less accurate area for measurement. Small interhemispheric subdural hematoma along the falx posteriorly also is unchanged.  Ventricle size is normal. No midline shift. No subarachnoid or parenchymal hemorrhage.  Chronic microvascular ischemic changes in the white matter. No acute infarct.  Negative for skull fracture. Mucosal edema in the paranasal sinuses.  IMPRESSION: Left parietal subdural hematoma is unchanged in size. Small interhemispheric subdural hematoma also unchanged. No shift of the midline structures.  No acute infarct.   Electronically Signed   By: Marlan Palauharles  Clark M.D.   On: 06/26/2014 15:42   Ct Head Wo Contrast  06/26/2014   CLINICAL DATA:  Larey SeatFell 2 days ago and hit head. Mental status changes and slurred speech.  EXAM: CT HEAD WITHOUT CONTRAST  TECHNIQUE: Contiguous axial images were obtained from the base of the skull through the vertex without intravenous contrast.  COMPARISON:  Head CT 02/24/2012  FINDINGS: There is a left-sided subdural hematoma involving the left temporal and parietal regions. It extends all the way to the left vertex posteriorly and has a maximal thickness of 18 mm. There is  compression of the sulci but no midline shift. The ventricles are in the midline and are normal and stable in configuration. Moderate cerebral atrophy. No findings for hemispheric infarction. Stable periventricular white matter disease. The brainstem and cerebellum are grossly normal and stable. Mild atrophy.  No acute skull fractures identified. The paranasal sinuses and mastoid air cells are clear except for minimal left maxillary sinus disease.  IMPRESSION: Left-sided subdural hematoma as discussed above. Compression of the sulci but no midline shift or herniation.  These results were called by telephone at the time of interpretation on 06/26/2014 at 9:51 am to Dr. Pricilla LovelessSCOTT GOLDSTON , who verbally acknowledged these results.   Electronically Signed   By: Loraine LericheMark  Gallerani M.D.   On: 06/26/2014 09:51   Dg Chest Port 1 View  06/27/2014   CLINICAL DATA:  Central line placement and intubation.  EXAM: PORTABLE CHEST - 1 VIEW  COMPARISON:  06/26/2014  FINDINGS: Endotracheal tube placed with tip measuring 5.8 cm above the carinal. Right central venous catheter placed with tip localized over the mid SVC region. No pneumothorax. Shallow inspiration. Borderline heart size and pulmonary vascularity. Linear atelectasis in the lung bases. Mesh from aortic valve replacement.  IMPRESSION: Appliances appear in satisfactory location. Shallow inspiration with atelectasis in the lung bases. No pneumothorax.   Electronically Signed   By: Burman Nieves M.D.   On: 06/27/2014 01:56        EKG: NSR CXR: Hypoinspiratory with bibasilar atelectasis, possible trace bilateral pleural effusions.  ASSESSMENT / PLAN:  Principal Problem:   SDH (subdural hematoma) Active Problems:   Abnormality of gait   HTN (hypertension)   Anxiety and depression   PULMONARY A: Acute Respiratory failure requiring mechanical ventilation.   - doing SBT but not awake enough for extubation P:   Hold off extubation 06/27/2014 AM; reassess  in PM Otherwise full Vent support and SBT as tolerated   CARDIOVASCULAR A: Recent AVR P:   Holding plavix given CNS bleed - will need to discuss with NSGY when this can safely be restarted. Labetalol prn for SBP < 160  RENAL A: Normal renal function, no other metabolic issues Will monitor for SIADH / CSW P:   monitor  GASTROINTESTINAL A: NPO P:   For possible extubation. If still intubated 06/28/14 - start tube feeds  HEMATOLOGIC A: Need for Antiplatelet therapy, now w/ CNS hemorrage P:   Risk / benefit of holding vs. Restarting will need to be further investigated. Will discuss with neurosurgery.  INFECTIOUS A: No acute issues. P:    ENDOCRINE A: No evidence of DM P:   --  NEUROLOGIC A: Subdural hematoma s/p evacuation 06/26/14 Mild essential tremor  _ improving neuro status but not awake enough for extubation  P:   Per neurosurgery  BEST PRACTICE / DISPOSITION Level of Care:  ICU Primary Service:  PCCM Consultants:  NSGY Code Status:  Full Diet:  NPO DVT Px:  Contraindicated given CNS bleed GI Px:  PPI Skin Integrity:  Intact (defer to RN documentation) Social / Family:  Daughter June Leap and wife updated. D/w Dr Danielle Dess at bedside  TODAY'S SUMMARY: 80 y/o man with subdural hematoma after falling while on plavix. Successful clot evacuation.   The patient is critically ill with multiple organ systems failure and requires high complexity decision making for assessment and support, frequent evaluation and titration of therapies, application of advanced monitoring technologies and extensive interpretation of multiple databases.   Critical Care Time devoted to patient care services described in this note is  31  Minutes. This time reflects time of care of this signee Dr Kalman Shan. This critical care time does not reflect procedure time, or teaching time or supervisory time of PA/NP/Med student/Med Resident etc but could involve care discussion  time    Dr. Kalman Shan, M.D., [facility].C.P Pulmonary and Critical Care Medicine Staff Physician [facility] [place] [facility] Critical [facility] Pager: [phone], If no answer or between  15:00h - 7:00h: call [phone]  06/27/2014 9:34 AM

## 2014-06-27 NOTE — Significant Event (Signed)
Mepilex border placed on patient sacral to protect skin. Sacral skin is intact. Skin tear noted on right lower foot when bath was given-RN placed a new mepilex on it as well.

## 2014-06-27 NOTE — Progress Notes (Signed)
Patient ID: Roy Mitchell, male   DOB: 02-25-1924, 80 y.o.   MRN: [id number] Vital signs are stable. Patient arouses to voice follow some simple commands however has not demonstrated enough strength to wean ventilator fully. Because of these changes I suggested a follow-up CT scan of the brain to rule out significant reaccumulation of subdural hematoma.

## 2014-06-28 ENCOUNTER — Inpatient Hospital Stay (HOSPITAL_COMMUNITY): Payer: Medicare Other

## 2014-06-28 LAB — BASIC METABOLIC PANEL
Anion gap: 12 (ref 5–15)
BUN: 18 mg/dL (ref 6–23)
CHLORIDE: 97 meq/L (ref 96–112)
CO2: 25 mEq/L (ref 19–32)
Calcium: 8.6 mg/dL (ref 8.4–10.5)
Creatinine, Ser: 0.65 mg/dL (ref 0.50–1.35)
GFR calc Af Amer: >90 mL/min (ref >90)
GFR, EST NON AFRICAN AMERICAN: 84 mL/min — AB (ref >90)
GLUCOSE: 155 mg/dL — AB (ref 70–99)
Potassium: 3.8 mEq/L (ref 3.7–5.3)
Sodium: 134 mEq/L — ABNORMAL LOW (ref 137–147)

## 2014-06-28 LAB — CBC WITH DIFFERENTIAL/PLATELET
Basophils Absolute: 0 10*3/uL (ref 0.0–0.1)
Basophils Relative: 0 % (ref 0–1)
Eosinophils Absolute: 0 10*3/uL (ref 0.0–0.7)
Eosinophils Relative: 0 % (ref 0–5)
HEMATOCRIT: 32.8 % — AB (ref 39.0–52.0)
Hemoglobin: 10.6 g/dL — ABNORMAL LOW (ref 13.0–17.0)
LYMPHS ABS: 0.9 10*3/uL (ref 0.7–4.0)
LYMPHS PCT: 8 % — AB (ref 12–46)
MCH: 26.3 pg (ref 26.0–34.0)
MCHC: 32.3 g/dL (ref 30.0–36.0)
MCV: 81.4 fL (ref 78.0–100.0)
MONO ABS: 1.4 10*3/uL — AB (ref 0.1–1.0)
Monocytes Relative: 12 % (ref 3–12)
Neutro Abs: 9.5 10*3/uL — ABNORMAL HIGH (ref 1.7–7.7)
Neutrophils Relative %: 80 % — ABNORMAL HIGH (ref 43–77)
Platelets: 133 10*3/uL — ABNORMAL LOW (ref 150–400)
RBC: 4.03 MIL/uL — AB (ref 4.22–5.81)
RDW: 16.5 % — ABNORMAL HIGH (ref 11.5–15.5)
WBC: 11.8 10*3/uL — ABNORMAL HIGH (ref 4.0–10.5)

## 2014-06-28 LAB — MAGNESIUM: Magnesium: 1.9 mg/dL (ref 1.5–2.5)

## 2014-06-28 LAB — PHOSPHORUS: Phosphorus: 2.5 mg/dL (ref 2.3–4.6)

## 2014-06-28 MED ORDER — CETYLPYRIDINIUM CHLORIDE 0.05 % MT LIQD
7.0000 mL | Freq: Two times a day (BID) | OROMUCOSAL | Status: DC
  Administered 2014-06-28 – 2014-07-07 (×20): 7 mL via OROMUCOSAL

## 2014-06-28 MED ORDER — MAGNESIUM SULFATE 2 GM/50ML IV SOLN
2.0000 g | Freq: Once | INTRAVENOUS | Status: AC
  Administered 2014-06-28: 2 g via INTRAVENOUS
  Filled 2014-06-28: qty 50

## 2014-06-28 MED ORDER — CHLORHEXIDINE GLUCONATE 0.12 % MT SOLN
15.0000 mL | Freq: Two times a day (BID) | OROMUCOSAL | Status: DC
  Administered 2014-06-28 – 2014-07-08 (×20): 15 mL via OROMUCOSAL
  Filled 2014-06-28 (×24): qty 15

## 2014-06-28 MED ORDER — ONDANSETRON 8 MG/NS 50 ML IVPB
8.0000 mg | Freq: Three times a day (TID) | INTRAVENOUS | Status: AC
Start: 2014-06-28 — End: 2014-06-29
  Administered 2014-06-28 – 2014-06-29 (×3): 8 mg via INTRAVENOUS
  Filled 2014-06-28 (×3): qty 8

## 2014-06-28 MED ORDER — FENTANYL CITRATE 0.05 MG/ML IJ SOLN: 12.5000 ug | INTRAMUSCULAR | Status: DC | PRN

## 2014-06-28 MED ORDER — HYDROCODONE-ACETAMINOPHEN 5-325 MG PO TABS
1.0000 | ORAL_TABLET | Freq: Four times a day (QID) | ORAL | Status: DC | PRN
Start: 2014-06-28 — End: 2014-06-29

## 2014-06-28 MED ORDER — SODIUM CHLORIDE 0.9 % IV SOLN
250.0000 mg | Freq: Two times a day (BID) | INTRAVENOUS | Status: DC
  Administered 2014-06-28 – 2014-07-02 (×8): 250 mg via INTRAVENOUS
  Filled 2014-06-28 (×9): qty 2.5

## 2014-06-28 NOTE — Progress Notes (Signed)
ETT advanced by charge RT from 21 at the lip to 25 at the lip due to audible leak & based on CXR results for 06/26/14 which read that the ETT was 5.8 cm above the carina. CXR already been done this AM, results are pending at this time.

## 2014-06-28 NOTE — Progress Notes (Signed)
Doing well, sleeping post extubation Family concerned that nausea, vomit can be a problem  A High risk nausea, vomit  P Scheduled zofran x 24h  Dr. Kalman ShanMurali Shaelin Lalley, M.D., [facility].C.P Pulmonary and Critical Care Medicine Staff Physician [facility] [place] Pager: [phone], If no answer or between  15:00h - 7:00h: call [phone]  06/28/2014 10:32 AM

## 2014-06-28 NOTE — Progress Notes (Signed)
Patient ID: Roy Mitchell, male   DOB: [birth year], 80 y.o.   MRN: [id number] Vital signs stable Patient appears more alert today Moving right side spontaneously and to command Will decrease dose of Keppra to 250 mg twice a day Hopefully the patient can wean ventilator and be extubated Continue supportive care Discussed CT findings with daughter. CT scan demonstrates that there is approximately 7-8 mm thick subdural reaccumulation. This does appear to be better than the preoperative scan.

## 2014-06-28 NOTE — Procedures (Signed)
Extubation Procedure Note  Patient Details:   Name: Roy Mitchell DOB: 09-01-1923 MRN: [id number]   Airway Documentation:     Evaluation  O2 sats: stable throughout Complications: No apparent complications Patient did tolerate procedure well. Bilateral Breath Sounds: Clear Suctioning: Airway, Oral No  PT was extubated to a 3L [name]. WAS is non verbal. Sats are stable   [name], [name] 06/28/2014, 10:02 AM

## 2014-06-28 NOTE — Progress Notes (Addendum)
PULMONARY / CRITICAL CARE MEDICINE HISTORY AND PHYSICAL EXAMINATION   Name: Roy Mitchell MRN: [id number] DOB: [birth year]    ADMISSION DATE:  06/26/2014  PRIMARY SERVICE: PCCM  CHIEF COMPLAINT:  Aphasia after recent fall.  BRIEF PATIENT DESCRIPTION:    80 y/o man with vascular disease, s/p AVR July of '15 (on plavix) who presented two days after a fall in which he hit his head but without LOC. The next morning he had some slurring of speech and word finding difficulty and was brought to the ED where he was found to have a subdural hematoma. Follow-up CT was negative for extension of the hematoma, but his exam declined, so he was taken to the OR for decompression with clot evacuation. This was performed without difficulty. He was kept intubated and PCCM was called for admission.   LINES / TUBES: 7.5 mm ETT - 06/26/14  CULTURES: None  ANTIBIOTICS: Vanocmycin (ppx 06/26/14)   SIGNIFICANT EVENTS / STUDIES:  OR for decompression / clot evacuation 06/26/14 06/27/14 - Family at bedside. 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 Doing SBt but appears a bit drowsy and also vomited x 1   SUBJECTIVE/OVERNIGHT/INTERVAL HX 06/28/14: Refractory vomit yesterday (daughter says sensitive to anesthesia and sedation) -> No vomit x 6-8h. Doing SBT on 8/5 but did not do well on 5/5; RT reattempting 5/5  VITAL SIGNS: Temp:  [98.1 F (36.7 C)-99.8 F (37.7 C)] 98.8 F (37.1 C) (12/13 0749) Pulse Rate:  [66-93] 69 (12/13 0800) Resp:  [14-35] 18 (12/13 0800) BP: (113-189)/(53-97) 147/69 mmHg (12/13 0800) SpO2:  [99 %-100 %] 99 % (12/13 0800) Arterial Line BP: (170-187)/(74-81) 170/80 mmHg (12/12 1130) FiO2 (%):  [40 %] 40 % (12/13 0800) Weight:  [91.5 kg (201 lb 11.5 oz)] 91.5 kg (201 lb 11.5 oz) (12/13 0500) HEMODYNAMICS:   VENTILATOR SETTINGS: Vent Mode:  [-] CPAP;PSV FiO2 (%):  [40 %] 40 % Set Rate:  [14 bmp] 14 bmp Vt Set:  [510 mL] 510 mL PEEP:  [5 cmH20] 5 cmH20 Pressure  Support:  [8 cmH20] 8 cmH20 Plateau Pressure:  [11 cmH20-16 cmH20] 11 cmH20 INTAKE / OUTPUT: Intake/Output      12/12 0701 - 12/13 0700 12/13 0701 - 12/14 0700   I.V. (mL/kg) 363.7 (4) 10 (0.1)   Other 375 100   NG/GT 30    IV Piggyback 100    Total Intake(mL/kg) 868.7 (9.5) 110 (1.2)   Urine (mL/kg/hr) 600 (0.3)    Blood     Total Output 600     Net +268.7 +110        Emesis Occurrence 2 x      PHYSICAL EXAMINATION: General:  Elderly man, intubated . Looks deconditioned Neuro:  RASS 0 to -1 (improved). Follows commands, Gag + HEENT:  MMM Neck: R IJ CVC in place. Cardiovascular:  No M/R/G Lungs:  CTAB Abdomen:  Non-distended Musculoskeletal:  No deformities. Skin:  No obvious breakdown  LABS: PULMONARY  Recent Labs Lab 06/27/14 0020  PHART 7.372  PCO2ART 41.8  PO2ART 122.0*  HCO3 23.7  TCO2 25.0  O2SAT 98.5    CBC  Recent Labs Lab 06/26/14 0955 06/27/14 0401 06/28/14 0245  HGB 12.2* 11.1* 10.6*  HCT 36.8* 34.8* 32.8*  WBC 7.8 8.9 11.8*  PLT 144* 139* 133*    COAGULATION  Recent Labs Lab 06/26/14 0955  INR 1.02    CARDIAC  No results for input(s): TROPONINI in the last 168 hours. No results for input(s): PROBNP  in the last 168 hours.   CHEMISTRY  Recent Labs Lab 06/26/14 0955 06/27/14 0401 06/28/14 0245  NA 138 137 134*  K 4.0 3.9 3.8  CL 99 101 97  CO2 29 23 25   GLUCOSE 111* 147* 155*  BUN 17 14 18   CREATININE 0.76 0.64 0.65  CALCIUM 9.2 8.6 8.6  MG  --   --  1.9  PHOS  --   --  2.5   Estimated Creatinine Clearance: 67.6 mL/min (by C-G formula based on Cr of 0.65).   LIVER  Recent Labs Lab 06/26/14 0955  AST 14  ALT 13  ALKPHOS 70  BILITOT 0.8  PROT 6.9  ALBUMIN 3.5  INR 1.02     INFECTIOUS No results for input(s): LATICACIDVEN, PROCALCITON in the last 168 hours.   ENDOCRINE CBG (last 3)  No results for input(s): GLUCAP in the last 72 hours.       IMAGING x48h Dg Chest 2 View  06/26/2014    CLINICAL DATA:  Larey Seat 2 days ago.  Left chest injury.  EXAM: CHEST  2 VIEW  COMPARISON:  03/29/2014  FINDINGS: The heart is upper limits of normal in size. There is tortuosity, ectasia and calcification of the thoracic aorta. An aortic valve stent is noted. There are chronic bronchitic lung changes but no acute overlying pulmonary process. No pulmonary contusion, pleural effusion or pneumothorax. The bony thorax is intact. No obvious rib or sternal fracture.  IMPRESSION: No acute cardiopulmonary findings and grossly intact bony thorax.   Electronically Signed   By: Loralie Champagne M.D.   On: 06/26/2014 09:28   Ct Head Wo Contrast  06/27/2014   CLINICAL DATA:  Subdural hematoma follow-up status post craniotomy.  EXAM: CT HEAD WITHOUT CONTRAST  TECHNIQUE: Contiguous axial images were obtained from the base of the skull through the vertex without intravenous contrast.  COMPARISON:  06/26/2014  FINDINGS: Sequelae of interval left parietal craniotomy are identified. Small amount of pneumocephalus is noted. There has been interval left subdural hematoma evacuation. A small amount of hyperdense left-sided extra-axial blood products remain, measuring up to 8 mm in thickness, overall significantly decreased from prior. A small amount of subdural blood again extends along the posterior aspect of the falx and left tentorium. Lower density extra-axial fluid over the left frontal convexity measures up to 1.3 cm in thickness with slight flattening of the underlying frontal brain parenchyma. There is no definite evidence of new intracranial hemorrhage.  Trace rightward midline shift persists, measuring 4 mm. There is no hydrocephalus. Periventricular white-matter hypodensities are nonspecific but compatible with mild chronic small vessel ischemic disease. There is no evidence of acute large territory infarct.  Prior bilateral cataract extraction is noted. There is mild left greater the right maxillary sinus mucosal thickening.  Mastoid air cells are clear. Skin staples are noted at the craniotomy site. A small amount of fluid and gas are present in the scalp soft tissues in the region of the craniotomy.  IMPRESSION: Postoperative changes from interval craniotomy and subdural hematoma evacuation as above.   Electronically Signed   By: Sebastian Ache   On: 06/27/2014 15:37   Ct Head Wo Contrast  06/26/2014   CLINICAL DATA:  Subdural hematoma. Fall 2 days ago. Worsening neurologic exam. Stroke like symptoms.  EXAM: CT HEAD WITHOUT CONTRAST  TECHNIQUE: Contiguous axial images were obtained from the base of the skull through the vertex without intravenous contrast.  COMPARISON:  CT head earlier today  FINDINGS: Subdural hemorrhage in  the left parietal lobe is unchanged. This measures up to 9.5 mm in the mid left parietal lobe and is thicker over the convexity however this is a less accurate area for measurement. Small interhemispheric subdural hematoma along the falx posteriorly also is unchanged.  Ventricle size is normal. No midline shift. No subarachnoid or parenchymal hemorrhage.  Chronic microvascular ischemic changes in the white matter. No acute infarct.  Negative for skull fracture. Mucosal edema in the paranasal sinuses.  IMPRESSION: Left parietal subdural hematoma is unchanged in size. Small interhemispheric subdural hematoma also unchanged. No shift of the midline structures.  No acute infarct.   Electronically Signed   By: Marlan Palauharles  Clark M.D.   On: 06/26/2014 15:42   Ct Head Wo Contrast  06/26/2014   CLINICAL DATA:  Larey SeatFell 2 days ago and hit head. Mental status changes and slurred speech.  EXAM: CT HEAD WITHOUT CONTRAST  TECHNIQUE: Contiguous axial images were obtained from the base of the skull through the vertex without intravenous contrast.  COMPARISON:  Head CT 02/24/2012  FINDINGS: There is a left-sided subdural hematoma involving the left temporal and parietal regions. It extends all the way to the left vertex posteriorly  and has a maximal thickness of 18 mm. There is compression of the sulci but no midline shift. The ventricles are in the midline and are normal and stable in configuration. Moderate cerebral atrophy. No findings for hemispheric infarction. Stable periventricular white matter disease. The brainstem and cerebellum are grossly normal and stable. Mild atrophy.  No acute skull fractures identified. The paranasal sinuses and mastoid air cells are clear except for minimal left maxillary sinus disease.  IMPRESSION: Left-sided subdural hematoma as discussed above. Compression of the sulci but no midline shift or herniation.  These results were called by telephone at the time of interpretation on 06/26/2014 at 9:51 am to Dr. Pricilla LovelessSCOTT GOLDSTON , who verbally acknowledged these results.   Electronically Signed   By: Loralie ChampagneMark  Gallerani M.D.   On: 06/26/2014 09:51   Dg Chest Port 1 View  06/28/2014   CLINICAL DATA:  Acute respiratory failure  EXAM: PORTABLE CHEST - 1 VIEW  COMPARISON:  06/26/2014  FINDINGS: Nasogastric tubes has been placed into the decompressed stomach. Endotracheal tube and right IJ central line are stable in position. Heart size upper limits normal for technique. Patchy perihilar and bibasilar interstitial edema or infiltrates stable. No definite effusion. Atheromatous aorta.  Previous AVR. Visualized skeletal structures are unremarkable.  IMPRESSION: 1. Nasogastric tube placement to the stomach. 2. Stable bilateral edema/infiltrates.   Electronically Signed   By: Oley Balmaniel  Hassell M.D.   On: 06/28/2014 08:31   Dg Chest Port 1 View  06/27/2014   CLINICAL DATA:  Central line placement and intubation.  EXAM: PORTABLE CHEST - 1 VIEW  COMPARISON:  06/26/2014  FINDINGS: Endotracheal tube placed with tip measuring 5.8 cm above the carinal. Right central venous catheter placed with tip localized over the mid SVC region. No pneumothorax. Shallow inspiration. Borderline heart size and pulmonary vascularity. Linear  atelectasis in the lung bases. Mesh from aortic valve replacement.  IMPRESSION: Appliances appear in satisfactory location. Shallow inspiration with atelectasis in the lung bases. No pneumothorax.   Electronically Signed   By: Burman NievesWilliam  Stevens M.D.   On: 06/27/2014 01:56        EKG: NSR CXR: Hypoinspiratory with bibasilar atelectasis, possible trace bilateral pleural effusions.  ASSESSMENT / PLAN:  Principal Problem:   SDH (subdural hematoma) Active Problems:   Abnormality of gait  HTN (hypertension)   Anxiety and depression   Acute respiratory failure with hypoxia   Acute respiratory failure   PULMONARY A: Acute Respiratory failure requiring mechanical ventilation.   - doing SBT  At 8.5  P:   If does well on SBT 5/5 , aim  extubation 06/28/2014 AM; Otherwise full Vent support and SBT as tolerated   CARDIOVASCULAR A: Recent AVR. P:   Holding plavix given CNS bleed - will need to discuss with NSGY when this can safely be restarted. Labetalol prn for SBP < 160  RENAL A: Normal renal function, no other metabolic issues except mild low mag 06/28/14 Will monitor for SIADH / CSW P:   Replete mag monitor  GASTROINTESTINAL A: NPO Having vomit 06/27/14  - daughter saysthis happens with anesthesia sedation P:   For possible extubation. If still intubated 06/29/14 - start tube feeds  HEMATOLOGIC A: Need for Antiplatelet therapy, now w/ CNS hemorrage P:   Risk / benefit of holding vs. Restarting will need to be further investigated after  discuss with neurosurgery.  INFECTIOUS A: No acute issues. P:    ENDOCRINE A: No evidence of DM P:   --  NEUROLOGIC A: Subdural hematoma s/p evacuation 06/26/14 Mild essential tremor  _ improving neuro status   P:   Per neurosurgery DC diprivan Do fentanyl prn RASS goal 0 to 02  BEST PRACTICE / DISPOSITION Level of Care:  ICU Primary Service:  PCCM Consultants:  NSGY Code Status:  Full Diet:  NPO DVT  Px:  Contraindicated given CNS bleed GI Px:  PPI Skin Integrity:  Intact (defer to RN documentation) Social / Family:  Daughter June Leap and wife updated. D/w Dr Danielle Dess at bedside  TODAY'S SUMMARY: 80 y/o man with subdural hematoma after falling while on plavix. Successful clot evacuation. Working towards extubation 06/28/14      Dr. Kalman Shan, M.D., [facility].C.P Pulmonary and Critical Care Medicine Staff Physician [facility] Pager: [phone], If no answer or between  15:00h - 7:00h: call [phone]  06/28/2014 8:47 AM

## 2014-06-29 ENCOUNTER — Inpatient Hospital Stay (HOSPITAL_COMMUNITY): Payer: Medicare Other

## 2014-06-29 DIAGNOSIS — J9601 Acute respiratory failure with hypoxia: Secondary | ICD-10-CM

## 2014-06-29 DIAGNOSIS — J9819 Other pulmonary collapse: Secondary | ICD-10-CM

## 2014-06-29 DIAGNOSIS — I6200 Nontraumatic subdural hemorrhage, unspecified: Secondary | ICD-10-CM

## 2014-06-29 LAB — CBC WITH DIFFERENTIAL/PLATELET
BASOS ABS: 0 10*3/uL (ref 0.0–0.1)
Basophils Relative: 0 % (ref 0–1)
EOS ABS: 0 10*3/uL (ref 0.0–0.7)
EOS PCT: 0 % (ref 0–5)
HCT: 34.7 % — ABNORMAL LOW (ref 39.0–52.0)
Hemoglobin: 11.1 g/dL — ABNORMAL LOW (ref 13.0–17.0)
Lymphocytes Relative: 8 % — ABNORMAL LOW (ref 12–46)
Lymphs Abs: 0.8 10*3/uL (ref 0.7–4.0)
MCH: 26.3 pg (ref 26.0–34.0)
MCHC: 32 g/dL (ref 30.0–36.0)
MCV: 82.2 fL (ref 78.0–100.0)
Monocytes Absolute: 1 10*3/uL (ref 0.1–1.0)
Monocytes Relative: 10 % (ref 3–12)
Neutro Abs: 8.6 10*3/uL — ABNORMAL HIGH (ref 1.7–7.7)
Neutrophils Relative %: 82 % — ABNORMAL HIGH (ref 43–77)
Platelets: 150 10*3/uL (ref 150–400)
RBC: 4.22 MIL/uL (ref 4.22–5.81)
RDW: 16.8 % — AB (ref 11.5–15.5)
WBC: 10.5 10*3/uL (ref 4.0–10.5)

## 2014-06-29 LAB — BASIC METABOLIC PANEL
ANION GAP: 12 (ref 5–15)
BUN: 23 mg/dL (ref 6–23)
CALCIUM: 8.9 mg/dL (ref 8.4–10.5)
CO2: 26 meq/L (ref 19–32)
Chloride: 100 mEq/L (ref 96–112)
Creatinine, Ser: 0.57 mg/dL (ref 0.50–1.35)
GFR, EST NON AFRICAN AMERICAN: 89 mL/min — AB (ref >90)
Glucose, Bld: 131 mg/dL — ABNORMAL HIGH (ref 70–99)
POTASSIUM: 3.8 meq/L (ref 3.7–5.3)
SODIUM: 138 meq/L (ref 137–147)

## 2014-06-29 LAB — MAGNESIUM: Magnesium: 2.3 mg/dL (ref 1.5–2.5)

## 2014-06-29 LAB — PHOSPHORUS: Phosphorus: 2.3 mg/dL (ref 2.3–4.6)

## 2014-06-29 MED ORDER — ALBUTEROL SULFATE (2.5 MG/3ML) 0.083% IN NEBU
2.5000 mg | INHALATION_SOLUTION | Freq: Four times a day (QID) | RESPIRATORY_TRACT | Status: DC
  Administered 2014-06-29 – 2014-07-08 (×36): 2.5 mg via RESPIRATORY_TRACT
  Filled 2014-06-29 (×37): qty 3

## 2014-06-29 MED ORDER — KCL IN DEXTROSE-NACL 20-5-0.45 MEQ/L-%-% IV SOLN
INTRAVENOUS | Status: DC
  Administered 2014-06-29 – 2014-07-01 (×3): via INTRAVENOUS
  Filled 2014-06-29 (×3): qty 1000

## 2014-06-29 NOTE — Progress Notes (Signed)
Patient ID: Roy Mitchell, male   DOB: 1924-05-08, 80 y.o.   MRN: [id number] Vital signs are stable. Motor function is intact Patient is off ventilator but seems to have little energy Right side is modestly weaker than left.

## 2014-06-29 NOTE — Progress Notes (Signed)
UR completed.  Jachai Okazaki, RN BSN [facility] CCM Trauma/Neuro ICU Case Manager [phone]  

## 2014-06-29 NOTE — Progress Notes (Addendum)
NUTRITION FOLLOW-UP  DOCUMENTATION CODES Per approved criteria  -Obesity Unspecified   INTERVENTION: If pt unable to swallow recommend: Vital AF 1.2, start at 20 ml/hr and increase by 10 ml every 4 hours to goal rate of 60 ml/hr.   Provides: 1728 kcal, 108 grams protein, and 1167 ml H2O.  NUTRITION DIAGNOSIS: Inadequate oral intake related to inability to eat as evidenced by NPO status; ongoing.   Goal: Enteral nutrition to provide 60-70% of estimated calorie needs (22-25 kcals/kg ideal body weight) and 100% of estimated protein needs, based on ASPEN guidelines for hypocaloric, high protein feeding in critically ill obese individuals; not met.   Monitor:  TF order vs diet order, total protein/energy intake, labs, weights  ASSESSMENT: 80 y/o man with vascular disease, s/p AVR July of '15 (on plavix) who presented two days after a fall in which he hit his head but without LOC. The next morning he had some slurring of speech and word finding difficulty and was brought to the ED where he was found to have a subdural hematoma. Follow-up CT was negative for extension of the hematoma, but his exam declined, so he was taken to the OR for decompression with clot evacuation  Pt extubated 12/13. Per CXR pt with total collapse of right lung. Pt currently on NRB mask.  SLP unable to do swallow study today with pt as pt is lethargic. Pt has large bore NG tube in place (tip in mid gastric body).   Height: Ht Readings from Last 1 Encounters:  06/26/14 $RemoveB'5\' 6"'DppezDWg$  (1.676 m)    Weight: Wt Readings from Last 1 Encounters:  06/29/14 197 lb 1.5 oz (89.4 kg)  Admission weight: 205 lb (92.9 kg) 12/11  BMI:  Body mass index is 31.83 kg/(m^2).  Estimated Nutritional Needs: Kcal: 1600-1800 Protein: 90-110 grams Fluid: >/= 1800 ml daily  Skin: surgical incision on head  Diet Order: Diet NPO time specified   Intake/Output Summary (Last 24 hours) at 06/29/14 1412 Last data filed at 06/29/14 1015  Gross per 24 hour  Intake   1305 ml  Output    200 ml  Net   1105 ml    Last BM: PTA   Labs:   Recent Labs Lab 06/27/14 0401 06/28/14 0245 06/29/14 0414  NA 137 134* 138  K 3.9 3.8 3.8  CL 101 97 100  CO2 $Re'23 25 26  'Pux$ BUN $R'14 18 23  'Is$ CREATININE 0.64 0.65 0.57  CALCIUM 8.6 8.6 8.9  MG  --  1.9 2.3  PHOS  --  2.5 2.3  GLUCOSE 147* 155* 131*    CBG (last 3)  No results for input(s): GLUCAP in the last 72 hours.  Scheduled Meds: . albuterol  2.5 mg Nebulization Q6H  . antiseptic oral rinse  7 mL Mouth Rinse QID  . antiseptic oral rinse  7 mL Mouth Rinse q12n4p  . chlorhexidine  15 mL Mouth Rinse BID  . levETIRAcetam  250 mg Intravenous Q12H    Continuous Infusions: . sodium chloride 10 mL/hr at 06/28/14 1900  . dextrose 5 % and 0.45 % NaCl with KCl 20 mEq/L 50 mL/hr at 06/29/14 [place], LDN, CNSC [phone] Pager [phone] After Hours Pager

## 2014-06-29 NOTE — Progress Notes (Signed)
PULMONARY / CRITICAL CARE MEDICINE HISTORY AND PHYSICAL EXAMINATION   Name: Theophilus KindsSherwood P Mcnellis MRN: [id number] DOB: 1923-12-29    ADMISSION DATE:  06/26/2014  PRIMARY SERVICE: PCCM  CHIEF COMPLAINT:  Aphasia after recent fall.  BRIEF PATIENT DESCRIPTION:  [age] M with AVR July 2015 (on plavix) who presented two days after a fall in which he hit his head and found to have a subdural hematoma. Underwent evacuation 12/11. Remained intubated post op. PCCM primary service.   SIGNIFICANT EVENTS / STUDIES:  12/11 CT head: 12/11 CT head: 12/11 craniotomy and evacuation of SDH ([place]). Remained intubated post op 12/12 CT head: 12/13 Extubated. Increasing O2 reqts over course of day 12/14 CXR with total collapse of R lung. Comfortable on NRB mask. Chest percussion and NTS ordered   LINES / TUBES: ETT 12/11 >> 12/13 R IJ CVL 12/11 >>   CULTURES:   ANTIBIOTICS:   SUBJECTIVE: RASS 0. Unintelligible speech. Not F/C   VITAL SIGNS: Temp:  [97.8 F (36.6 C)-98.8 F (37.1 C)] 98.1 F (36.7 C) (12/14 0752) Pulse Rate:  [64-86] 64 (12/14 0900) Resp:  [16-32] 19 (12/14 0900) BP: (130-173)/(57-76) 173/67 mmHg (12/14 0900) SpO2:  [90 %-97 %] 96 % (12/14 0900) FiO2 (%):  [55 %-100 %] 100 % (12/14 0950) Weight:  [89.4 kg (197 lb 1.5 oz)] 89.4 kg (197 lb 1.5 oz) (12/14 0500) HEMODYNAMICS:   VENTILATOR SETTINGS: Vent Mode:  [-]  FiO2 (%):  [55 %-100 %] 100 % INTAKE / OUTPUT: Intake/Output      12/13 0701 - 12/14 0700 12/14 0701 - 12/15 0700   I.V. (mL/kg) 240 (2.7) 30 (0.3)   Other 1090 100   NG/GT 90    IV Piggyback 402.5 102.5   Total Intake(mL/kg) 1822.5 (20.4) 232.5 (2.6)   Urine (mL/kg/hr)     Emesis/NG output 100 (0) 100 (0.3)   Total Output 100 100   Net +1722.5 +132.5          PHYSICAL EXAMINATION: General:  NAD on NRB mask Neuro:  RASS 0. Not F/C, speech is unintelligible.  HEENT: WNL Neck: No JVD Cardiovascular:  Reg, no M Lungs: Diminished BS throughout on R, no  wheezes Abdomen:  Soft, +BS Ext: warm, no edema  LABS: I have reviewed all of today's lab results. Relevant abnormalities are discussed in the A/P section  CXR: total collapse of R lung with R main bronchus cutoff sign  ASSESSMENT / PLAN: PULMONARY A: Acute Respiratory failure after craniotomy R lung collapse due to mucus plug P:   Cont supp O2 to maintain SpO2 > 92% Chest percussion and NTS ordered 12/14 Empiric albuterol ordered 12/14 to help with mucociliary function Repeat CXR AM 12/15, if R lung not improved, consider FOB  CARDIOVASCULAR A: Recent AVR P:   Holding clopidogrel until resumption is OK'd by NS  Labetalol prn for SBP < 160  RENAL A: No acute issues P:   Monitor BMET intermittently Monitor I/Os Correct electrolytes as indicated  GASTROINTESTINAL A: Dysphagia due to SDH P:   SLP eval ordered  HEMATOLOGIC A: Mild anemia without acute blood loss P:   DVT px: SCDs Monitor CBC intermittently Transfuse per usual ICU guidelines  INFECTIOUS A: No acute issues. P:   Micro and abx as above  ENDOCRINE A: Mild hyperglycemia without hx of DM P:   Consider SSI if glu > 180  NEUROLOGIC A: Traumatic SDH,  s/p evacuation 12/11 Mild essential tremor P:   Post op mgmt per NS  David Simonds, MD ; PCCM service Mobile [phone].  After 5:30 PM or weekends, call [phone]  

## 2014-06-29 NOTE — Evaluation (Signed)
Clinical/Bedside Swallow Evaluation Patient Details  Name: Theophilus KindsSherwood P Topper MRN: [id number] Date of Birth: 1924-02-28  Today's Date: 06/29/2014 Time: [id number] SLP Time Calculation (min) (ACUTE ONLY): 12 min  Past Medical History:  Past Medical History  Diagnosis Date  . Hypercholesterolemia   . Benign prostatic hypertrophy   . GERD (gastroesophageal reflux disease)   . DDD (degenerative disc disease), cervical   . Tinnitus   . Multinodular goiter   . Prediabetes   . DDD (degenerative disc disease), lumbar   . Hypertension   . Pulmonary nodules   . Anxiety disorder    Past Surgical History:  Past Surgical History  Procedure Laterality Date  . Aortic valve replacement  01/2014  . History of blood transfusion    . Hernia repair      6 months old   HPI:  80 year old man with history of HTN, HLD, prediabetes, GERD, multinodular goiter, degenerative disc disease, bovine aortic valve replacement admitted with aphasia. Wednesday afternoon he had a mechanical fall while walking on the sidewalk. He hit the left side of his head. Denies LOC. CT Left-sided subdural hematoma.  Underwent left parietal craniotomy 06/26/14.  Intubated 12/11-12/14/15.   Assessment / Plan / Recommendation Clinical Impression  Pt. awake however lethargic, not responding to SLP's commands with multimodal cues.  Oral hygiene performed.  Unsafe to attempts po's at this time given inability to achieve lingual/labial movements, large bore NGT and on non rebreather. Education provided for daughter and wife.  Continue oral care and ST will follow for improvements in oral-motor ability and swallow initiation.     Aspiration Risk  Severe    Diet Recommendation NPO   Medication Administration: Via alternative means    Other  Recommendations Oral Care Recommendations: Oral care BID   Follow Up Recommendations   (TBD)    Frequency and Duration min 2x/week  2 weeks   Pertinent Vitals/Pain Facial grimace with head  raised      Swallow Study          Oral/Motor/Sensory Function Overall Oral Motor/Sensory Function: Impaired (no oral movements on command or spontaneously)   Ice Chips Ice chips: Not tested   Thin Liquid Thin Liquid: Not tested    Nectar Thick Nectar Thick Liquid: Not tested   Honey Thick Honey Thick Liquid: Not tested   Puree Puree: Not tested   Solid   GO    Solid: Not tested       [name], Ronalee Scheunemann Willis 06/29/2014,9:03 AM   [name] M.Ed [employer]SLP Pager [phone]

## 2014-06-30 ENCOUNTER — Inpatient Hospital Stay (HOSPITAL_COMMUNITY): Payer: Medicare Other

## 2014-06-30 ENCOUNTER — Encounter (HOSPITAL_COMMUNITY): Payer: Self-pay | Admitting: Neurological Surgery

## 2014-06-30 DIAGNOSIS — J9819 Other pulmonary collapse: Secondary | ICD-10-CM | POA: Insufficient documentation

## 2014-06-30 DIAGNOSIS — J9600 Acute respiratory failure, unspecified whether with hypoxia or hypercapnia: Secondary | ICD-10-CM

## 2014-06-30 LAB — BASIC METABOLIC PANEL
Anion gap: 10 (ref 5–15)
BUN: 26 mg/dL — AB (ref 6–23)
CHLORIDE: 101 meq/L (ref 96–112)
CO2: 29 meq/L (ref 19–32)
CREATININE: 0.6 mg/dL (ref 0.50–1.35)
Calcium: 9 mg/dL (ref 8.4–10.5)
GFR calc Af Amer: >90 mL/min (ref >90)
GFR calc non Af Amer: 87 mL/min — ABNORMAL LOW (ref >90)
Glucose, Bld: 167 mg/dL — ABNORMAL HIGH (ref 70–99)
POTASSIUM: 3.7 meq/L (ref 3.7–5.3)
Sodium: 140 mEq/L (ref 137–147)

## 2014-06-30 LAB — TYPE AND SCREEN
ABO/RH(D): O POS
Antibody Screen: POSITIVE
DAT, IgG: NEGATIVE
DONOR AG TYPE: NEGATIVE
DONOR AG TYPE: NEGATIVE
DONOR AG TYPE: NEGATIVE
Donor AG Type: NEGATIVE
PT AG TYPE: NEGATIVE
Unit division: 0
Unit division: 0
Unit division: 0
Unit division: 0

## 2014-06-30 LAB — CBC WITH DIFFERENTIAL/PLATELET
Basophils Absolute: 0 10*3/uL (ref 0.0–0.1)
Basophils Relative: 0 % (ref 0–1)
Eosinophils Absolute: 0 10*3/uL (ref 0.0–0.7)
Eosinophils Relative: 0 % (ref 0–5)
HEMATOCRIT: 33.6 % — AB (ref 39.0–52.0)
HEMOGLOBIN: 11 g/dL — AB (ref 13.0–17.0)
Lymphocytes Relative: 7 % — ABNORMAL LOW (ref 12–46)
Lymphs Abs: 0.7 10*3/uL (ref 0.7–4.0)
MCH: 26.8 pg (ref 26.0–34.0)
MCHC: 32.7 g/dL (ref 30.0–36.0)
MCV: 81.8 fL (ref 78.0–100.0)
MONO ABS: 1.2 10*3/uL — AB (ref 0.1–1.0)
MONOS PCT: 12 % (ref 3–12)
NEUTROS ABS: 8.2 10*3/uL — AB (ref 1.7–7.7)
Neutrophils Relative %: 81 % — ABNORMAL HIGH (ref 43–77)
Platelets: 165 10*3/uL (ref 150–400)
RBC: 4.11 MIL/uL — ABNORMAL LOW (ref 4.22–5.81)
RDW: 16.5 % — ABNORMAL HIGH (ref 11.5–15.5)
WBC: 10.2 10*3/uL (ref 4.0–10.5)

## 2014-06-30 MED ORDER — MIDAZOLAM HCL 2 MG/2ML IJ SOLN
2.0000 mg | Freq: Once | INTRAMUSCULAR | Status: AC
  Administered 2014-06-30: 2 mg via INTRAVENOUS

## 2014-06-30 MED ORDER — FENTANYL CITRATE 0.05 MG/ML IJ SOLN
25.0000 ug | Freq: Once | INTRAMUSCULAR | Status: AC
  Administered 2014-06-30: 25 ug via INTRAVENOUS

## 2014-06-30 MED ORDER — ACETYLCYSTEINE 10% NICU INHALATION SOLUTION
5.0000 mL | Freq: Once | RESPIRATORY_TRACT | Status: DC
  Filled 2014-06-30: qty 5

## 2014-06-30 MED ORDER — ACETYLCYSTEINE 20 % IN SOLN
4.0000 mL | Freq: Once | RESPIRATORY_TRACT | Status: AC
  Administered 2014-06-30: 4 mL via ORAL

## 2014-06-30 MED ORDER — PHENYLEPHRINE HCL 0.25 % NA SOLN
1.0000 | Freq: Once | NASAL | Status: DC
  Filled 2014-06-30: qty 15

## 2014-06-30 MED ORDER — ACETYLCYSTEINE 10 % IN SOLN
12.0000 mL | Freq: Once | RESPIRATORY_TRACT | Status: DC
  Filled 2014-06-30: qty 12

## 2014-06-30 MED ORDER — LIDOCAINE VISCOUS 2 % MT SOLN
15.0000 mL | Freq: Once | OROMUCOSAL | Status: DC
  Filled 2014-06-30: qty 15

## 2014-06-30 MED ORDER — FENTANYL CITRATE 0.05 MG/ML IJ SOLN: 100.0000 ug | Freq: Once | INTRAMUSCULAR | Status: DC

## 2014-06-30 MED ORDER — FENTANYL BOLUS VIA INFUSION: 100.0000 ug | Freq: Once | INTRAVENOUS | Status: DC

## 2014-06-30 MED ORDER — FENTANYL CITRATE 0.05 MG/ML IJ SOLN
INTRAMUSCULAR | Status: AC
  Administered 2014-06-30: 25 ug via INTRAVENOUS
  Filled 2014-06-30: qty 2

## 2014-06-30 MED ORDER — MIDAZOLAM HCL 2 MG/2ML IJ SOLN
INTRAMUSCULAR | Status: AC
  Administered 2014-06-30: 2 mg via INTRAVENOUS
  Filled 2014-06-30: qty 2

## 2014-06-30 NOTE — Progress Notes (Signed)
Pt. pulled out NG tube. E-link notified. Possible bronch in the AM so NG tube reinsertion held for now. Will reevaluate in the AM.

## 2014-06-30 NOTE — Procedures (Signed)
Indication:   Mucus plugging with total collapse of R lung  Premedication:  Midaz 2 mg Fentanyl 25 mcg   Anesthesia: None  Procedure: After adequate sedation and anesthesia, the bronchoscope was introduced via the R naris and advanced into the posterior pharynx, through the cords to the trachea and into the R bronchial tree. No exam of the left side was performed. There was thick mucoid mucus plugging the entirety of the R mainstem bronchus. This was successfully removed after instillation of NS and NAC  Findings:  R main bronchus plugged with thick mucus  Specimens:   Resp culture  Post procedure evaluation:  The patient tolerated the procedure well with no major complications and will be watched closely in the ICU post procedure    [name], MD;  PCCM service; Mobile [phone]

## 2014-06-30 NOTE — Progress Notes (Signed)
PT Cancellation Note  Patient Details Name: Roy Mitchell MRN: [id number] DOB: Jul 13, 1924   Cancelled Treatment:    Reason Eval/Treat Not Completed: Patient not medically ready.  Spoke with RN who requests hold at this time.  Will check back as appropriate.     Roy Mitchell, Alison MurrayMegan F 06/30/2014, 8:27 AM

## 2014-06-30 NOTE — Progress Notes (Signed)
Patient ID: Roy Mitchell, male   DOB: May 23, 1924, 80 y.o.   MRN: [id number] Vital signs are stable. Patient arouses to voice and does follow commands but clearly has some aphasia. Not producing much speech. Dressing is clean and dry. Still requiring substantial support. No substantial changes.

## 2014-06-30 NOTE — Progress Notes (Addendum)
PULMONARY / CRITICAL CARE MEDICINE HISTORY AND PHYSICAL EXAMINATION   Name: Roy Mitchell P Hubbs MRN: [id number] DOB: 1924/01/11    ADMISSION DATE:  06/26/2014  PRIMARY SERVICE: PCCM  CHIEF COMPLAINT:  Aphasia after recent fall.  BRIEF PATIENT DESCRIPTION:  [age] M with AVR July 2015 (on plavix) who presented two days after a fall in which he hit his head and found to have a subdural hematoma. Underwent evacuation 12/11. Remained intubated post op. PCCM primary service.   SIGNIFICANT EVENTS / STUDIES:  12/11 CT head: 12/11 CT head: 12/11 craniotomy and evacuation of SDH (Roy Mitchell). Remained intubated post op 12/12 CT head: 12/13 Extubated. Increasing O2 reqts over course of day 12/14 CXR with total collapse of R lung. Comfortable on NRB mask. Chest percussion and NTS ordered 12/15 CXR unchanged. Bedside FOB performed. Moderate amount of very thick mucoid secretions removed from R mainstem bronchus  LINES / TUBES: ETT 12/11 >> 12/13 R IJ CVL 12/11 >>   CULTURES: Resp 12/15 >>   ANTIBIOTICS:   SUBJECTIVE: RASS 0. Unintelligible speech. No overt distress but still requiring 100% NRB. + F/C intermittently   VITAL SIGNS: Temp:  [97.9 F (36.6 C)-99.4 F (37.4 C)] 98.5 F (36.9 C) (12/15 1203) Pulse Rate:  [65-99] 66 (12/15 1300) Resp:  [18-29] 23 (12/15 1330) BP: (115-185)/(48-106) 162/65 mmHg (12/15 1300) SpO2:  [93 %-100 %] 100 % (12/15 1330) FiO2 (%):  [55 %-100 %] 55 % (12/15 1330) HEMODYNAMICS:   VENTILATOR SETTINGS: Vent Mode:  [-]  FiO2 (%):  [55 %-100 %] 55 % INTAKE / OUTPUT: Intake/Output      12/14 0701 - 12/15 0700 12/15 0701 - 12/16 0700   I.V. (mL/kg) 910 (10.2) 300 (3.4)   Other 475    NG/GT     IV Piggyback 205 102   Total Intake(mL/kg) 1590 (17.8) 402 (4.5)   Urine (mL/kg/hr) 370 (0.2) 185 (0.3)   Emesis/NG output 550 (0.3)    Total Output 920 185   Net +670 +217          PHYSICAL EXAMINATION: General:  NAD on NRB mask Neuro:  RASS 0. Not F/C,  speech is unintelligible.  HEENT: WNL Neck: No JVD Cardiovascular:  Reg, no M Lungs: Diminished BS throughout on R, no wheezes Abdomen:  Soft, +BS Ext: warm, no edema  LABS: I have reviewed all of today's lab results. Relevant abnormalities are discussed in the A/P section  CXR: NSC total collapse of R lung with R main bronchus cutoff sign  ASSESSMENT / PLAN: PULMONARY A: Acute Respiratory failure after craniotomy R lung collapse due to mucus plug P:   Cont supp O2 to maintain SpO2 > 92% Cont chest percussion and PRN NTS  Empiric albuterol ordered 12/14 to help with mucociliary function Repeat CXR AM 12/16  CARDIOVASCULAR A: Recent AVR P:   Holding clopidogrel until resumption is OK'd by NS  Labetalol prn for SBP < 160  RENAL A: No acute issues P:   Monitor BMET intermittently Monitor I/Os Correct electrolytes as indicated  GASTROINTESTINAL A: Dysphagia due to SDH P:   SLP evaluating If unable to take oral nutrition by 12/16, consider NGT and TFs  HEMATOLOGIC A: Mild anemia without acute blood loss P:   DVT px: SCDs Monitor CBC intermittently Transfuse per usual ICU guidelines  INFECTIOUS A: No acute issues. P:   Micro and abx as above  ENDOCRINE A: Mild hyperglycemia without hx of DM P:   Consider SSI if glu > 180  NEUROLOGIC A: Traumatic SDH,  s/p evacuation 12/11 Mild essential tremor P:   Post op mgmt per NS   Family updated @ bedside  Roy Fischeravid Simonds, MD ; [facility] Mobile [phone].  After 5:30 PM or weekends, call [phone]

## 2014-06-30 NOTE — Progress Notes (Signed)
SLP Cancellation Note  Patient Details Name: Roy Mitchell MRN: [id number] DOB: Jul 23, 1923    Cancelled treatment:        Spoke with pt's RN and progress note indicates possible bronch today.  Will hold on ongoing assessment with po's.   [name], [name] 06/30/2014, 10:00 AM  [name] [name]

## 2014-07-01 ENCOUNTER — Inpatient Hospital Stay (HOSPITAL_COMMUNITY): Payer: Medicare Other

## 2014-07-01 LAB — CBC WITH DIFFERENTIAL/PLATELET
BASOS ABS: 0 10*3/uL (ref 0.0–0.1)
Basophils Relative: 0 % (ref 0–1)
Eosinophils Absolute: 0 10*3/uL (ref 0.0–0.7)
Eosinophils Relative: 0 % (ref 0–5)
HCT: 33.6 % — ABNORMAL LOW (ref 39.0–52.0)
Hemoglobin: 10.6 g/dL — ABNORMAL LOW (ref 13.0–17.0)
Lymphocytes Relative: 8 % — ABNORMAL LOW (ref 12–46)
Lymphs Abs: 0.7 10*3/uL (ref 0.7–4.0)
MCH: 26 pg (ref 26.0–34.0)
MCHC: 31.5 g/dL (ref 30.0–36.0)
MCV: 82.6 fL (ref 78.0–100.0)
MONO ABS: 0.9 10*3/uL (ref 0.1–1.0)
Monocytes Relative: 11 % (ref 3–12)
NEUTROS ABS: 6.8 10*3/uL (ref 1.7–7.7)
NEUTROS PCT: 81 % — AB (ref 43–77)
PLATELETS: 175 10*3/uL (ref 150–400)
RBC: 4.07 MIL/uL — ABNORMAL LOW (ref 4.22–5.81)
RDW: 16.5 % — AB (ref 11.5–15.5)
WBC: 8.4 10*3/uL (ref 4.0–10.5)

## 2014-07-01 LAB — BASIC METABOLIC PANEL
ANION GAP: 11 (ref 5–15)
BUN: 22 mg/dL (ref 6–23)
CALCIUM: 9.1 mg/dL (ref 8.4–10.5)
CO2: 29 mEq/L (ref 19–32)
CREATININE: 0.54 mg/dL (ref 0.50–1.35)
Chloride: 102 mEq/L (ref 96–112)
GFR calc Af Amer: >90 mL/min (ref >90)
Glucose, Bld: 150 mg/dL — ABNORMAL HIGH (ref 70–99)
Potassium: 3.5 mEq/L — ABNORMAL LOW (ref 3.7–5.3)
Sodium: 142 mEq/L (ref 137–147)

## 2014-07-01 LAB — GLUCOSE, CAPILLARY
Glucose-Capillary: 128 mg/dL — ABNORMAL HIGH (ref 70–99)
Glucose-Capillary: 130 mg/dL — ABNORMAL HIGH (ref 70–99)

## 2014-07-01 MED ORDER — FUROSEMIDE 10 MG/ML IJ SOLN
40.0000 mg | Freq: Once | INTRAMUSCULAR | Status: AC
  Administered 2014-07-01: 40 mg via INTRAVENOUS
  Filled 2014-07-01: qty 4

## 2014-07-01 MED ORDER — POTASSIUM CHLORIDE 10 MEQ/50ML IV SOLN
10.0000 meq | INTRAVENOUS | Status: AC
  Administered 2014-07-01 (×6): 10 meq via INTRAVENOUS
  Filled 2014-07-01 (×7): qty 50

## 2014-07-01 MED ORDER — CEFTRIAXONE SODIUM IN DEXTROSE 20 MG/ML IV SOLN
1.0000 g | INTRAVENOUS | Status: DC
  Administered 2014-07-01 – 2014-07-08 (×8): 1 g via INTRAVENOUS
  Filled 2014-07-01 (×8): qty 50

## 2014-07-01 MED ORDER — FREE WATER
200.0000 mL | Freq: Three times a day (TID) | Status: DC
  Administered 2014-07-01 – 2014-07-03 (×5): 200 mL

## 2014-07-01 MED ORDER — INSULIN ASPART 100 UNIT/ML ~~LOC~~ SOLN
0.0000 [IU] | SUBCUTANEOUS | Status: DC
  Administered 2014-07-01 – 2014-07-02 (×3): 1 [IU] via SUBCUTANEOUS
  Administered 2014-07-02: 2 [IU] via SUBCUTANEOUS
  Administered 2014-07-02 (×3): 1 [IU] via SUBCUTANEOUS
  Administered 2014-07-03 (×2): 2 [IU] via SUBCUTANEOUS
  Administered 2014-07-03 (×2): 1 [IU] via SUBCUTANEOUS
  Administered 2014-07-03: 2 [IU] via SUBCUTANEOUS
  Administered 2014-07-04: 1 [IU] via SUBCUTANEOUS
  Administered 2014-07-04 (×2): 2 [IU] via SUBCUTANEOUS
  Administered 2014-07-04 (×2): 1 [IU] via SUBCUTANEOUS
  Administered 2014-07-04 – 2014-07-06 (×8): 2 [IU] via SUBCUTANEOUS
  Administered 2014-07-06: 1 [IU] via SUBCUTANEOUS
  Administered 2014-07-06 – 2014-07-07 (×2): 2 [IU] via SUBCUTANEOUS
  Administered 2014-07-07: 1 [IU] via SUBCUTANEOUS
  Administered 2014-07-07 (×3): 2 [IU] via SUBCUTANEOUS
  Administered 2014-07-07 – 2014-07-08 (×4): 1 [IU] via SUBCUTANEOUS

## 2014-07-01 MED ORDER — VITAL AF 1.2 CAL PO LIQD
1000.0000 mL | ORAL | Status: DC
  Administered 2014-07-01 – 2014-07-07 (×8): 1000 mL
  Filled 2014-07-01 (×12): qty 1000

## 2014-07-01 MED ORDER — VITAL HIGH PROTEIN PO LIQD
1000.0000 mL | ORAL | Status: DC
  Filled 2014-07-01 (×2): qty 1000

## 2014-07-01 NOTE — Progress Notes (Signed)
Patient ID: Roy Mitchell, male   DOB: 1924/03/13, 80 y.o.   MRN: [id number] Vital signs are stable. Opens eyes. Recognizes family members Occasionally follows commands A phasic

## 2014-07-01 NOTE — Progress Notes (Signed)
Rehab Admissions Coordinator Note:  Patient was screened by Trish MageLogue, Kamonte Mcmichen M for appropriateness for an Inpatient Acute Rehab Consult.  At this time, we are recommending Inpatient Rehab consult.  Trish MageLogue, Saharah Sherrow M 07/01/2014, 11:46 AM  I can be reached at [phone].

## 2014-07-01 NOTE — Progress Notes (Signed)
NUTRITION FOLLOW-UP  DOCUMENTATION CODES Per approved criteria  -Obesity Unspecified   INTERVENTION:  Vital AF 1.2, start at 20 ml/hr and increase by 10 ml every 4 hours to goal rate of 60 ml/hr.   Provides: 1728 kcal, 108 grams protein, and 1167 ml H2O.  NUTRITION DIAGNOSIS: Inadequate oral intake related to inability to eat as evidenced by NPO status; ongoing.   Goal: Pt to meet >/= 90% of their estimated nutrition needs   Monitor:  TF order vs diet order, total protein/energy intake, labs, weights  ASSESSMENT: 80 y/o man with vascular disease, s/p AVR July of '15 (on plavix) who presented two days after a fall in which he hit his head but without LOC. The next morning he had some slurring of speech and word finding difficulty and was brought to the ED where he was found to have a subdural hematoma. Follow-up CT was negative for extension of the hematoma, but his exam declined, so he was taken to the OR for decompression with clot evacuation  Pt extubated 12/13. Per CXR pt with total collapse of right lung. Pt currently on mask for O2.  Feeding tube placed, xray pending. Pt discussed during ICU rounds and with RN.  Potassium low and on supplementation.   Height: Ht Readings from Last 1 Encounters:  06/26/14 5\' 6"  (1.676 m)    Weight: Wt Readings from Last 1 Encounters:  06/29/14 197 lb 1.5 oz (89.4 kg)  Admission weight: 205 lb (92.9 kg) 12/11  BMI:  Body mass index is 31.83 kg/(m^2).  Estimated Nutritional Needs: Kcal: 1600-1800 Protein: 90-110 grams Fluid: >/= 1800 ml daily  Skin: surgical incision on head  Diet Order: Diet NPO time specified   Intake/Output Summary (Last 24 hours) at 07/01/14 1614 Last data filed at 07/01/14 1400  Gross per 24 hour  Intake 1381.67 ml  Output   1990 ml  Net -608.33 ml    Last BM: PTA   Labs:   Recent Labs Lab 06/28/14 0245 06/29/14 0414 06/30/14 0500 07/01/14 0510  NA 134* 138 140 142  K 3.8 3.8 3.7 3.5*  CL  97 100 101 102  CO2 25 26 29 29   BUN 18 23 26* 22  CREATININE 0.65 0.57 0.60 0.54  CALCIUM 8.6 8.9 9.0 9.1  MG 1.9 2.3  --   --   PHOS 2.5 2.3  --   --   GLUCOSE 155* 131* 167* 150*    CBG (last 3)  No results for input(s): GLUCAP in the last 72 hours.  Scheduled Meds: . albuterol  2.5 mg Nebulization Q6H  . antiseptic oral rinse  7 mL Mouth Rinse q12n4p  . cefTRIAXone (ROCEPHIN)  IV  1 g Intravenous Q24H  . chlorhexidine  15 mL Mouth Rinse BID  . free water  200 mL Per Tube 3 times per day  . insulin aspart  0-9 Units Subcutaneous 6 times per day  . levETIRAcetam  250 mg Intravenous Q12H  . potassium chloride  10 mEq Intravenous Q1 Hr x 6    Continuous Infusions: . sodium chloride 10 mL/hr at 06/28/14 1900  . feeding supplement (VITAL AF 1.2 CAL)     Kendell BaneHeather Clarke Peretz RD, LDN, CNSC [phone] Pager [phone] After Hours Pager

## 2014-07-01 NOTE — Evaluation (Signed)
Physical Therapy Evaluation Patient Details Name: Roy Mitchell MRN: [id number] DOB: 1924/06/22 Today's Date: 07/01/2014   History of Present Illness  [age] M with AVR July 2015 (on plavix) who presented two days after a fall in which he hit his head and found to have a subdural hematoma.  Underwent evacuation 12/11. Remained intubated post op extubated 06/28/14.   Clinical Impression  Patient demonstrates deficits in functional mobility as indicated below. Will need continued skilled PT to address deficits and maximize function. Will see as indicated and progress as tolerated. OF NOTE: VSS on NRB mask, BP 150s/70.    Follow Up Recommendations CIR;Supervision/Assistance - 24 hour    Equipment Recommendations  None recommended by PT    Recommendations for Other Services Rehab consult;OT consult     Precautions / Restrictions Precautions Precautions: Fall Restrictions Weight Bearing Restrictions: No      Mobility  Bed Mobility Overal bed mobility: Needs Assistance;+2 for physical assistance Bed Mobility: Supine to Sit     Supine to sit: Max assist;+2 for physical assistance;HOB elevated     General bed mobility comments: able to intiate some LE movement and use of LUE for positioning and propping, utilized helicopter technique with chuck bed  Transfers Overall transfer level: Needs assistance Equipment used:  (+2 face to face with chuck pad and gait belt) Transfers: Sit to/from BJ'sStand;Stand Pivot Transfers Sit to Stand: Max assist;+2 physical assistance Stand pivot transfers: Max assist;+2 physical assistance       General transfer comment: Able to take some weight through BLE, unable to reach complete upright despite full physical assist and bilateral LE knee blocking  Ambulation/Gait                Stairs            Wheelchair Mobility    Modified Rankin (Stroke Patients Only) Modified Rankin (Stroke Patients Only) Pre-Morbid Rankin Score: No  significant disability Modified Rankin: Severe disability     Balance Overall balance assessment: Needs assistance;History of Falls Sitting-balance support: Single extremity supported;Feet supported Sitting balance-Leahy Scale: Poor Sitting balance - Comments: required assist at times to maintain balance, heavy reliance on LUE support Postural control: Left lateral lean Standing balance support: During functional activity Standing balance-Leahy Scale: Zero                               Pertinent Vitals/Pain Pain Assessment: No/denies pain    Home Living Family/patient expects to be discharged to:: Inpatient rehab Living Arrangements: Spouse/significant other Available Help at Discharge: Family Type of Home: House Home Access: Ramped entrance     Home Layout: One level Home Equipment: Environmental consultantWalker - 2 wheels;Cane - single point;Wheelchair - manual;Hospital bed Additional Comments: walk in shower    Prior Function Level of Independence: Independent with assistive device(s)         Comments: was walking with use of a cane     Hand Dominance   Dominant Hand: Right    Extremity/Trunk Assessment   Upper Extremity Assessment: Defer to OT evaluation           Lower Extremity Assessment: RLE deficits/detail;LLE deficits/detail;Difficult to assess due to impaired cognition (gross limited strength and poor coordination)      Cervical / Trunk Assessment:  (fwd head posture)  Communication      Cognition Arousal/Alertness: Awake/alert Behavior During Therapy: Flat affect Overall Cognitive Status: Impaired/Different from baseline Area of Impairment: Following commands;Safety/judgement;Awareness;Problem solving  Following Commands: Follows one step commands consistently;Follows one step commands with increased time   Awareness: Intellectual;Emergent (emerging emergent ) Problem Solving: Slow processing;Decreased initiation;Difficulty  sequencing;Requires verbal cues;Requires tactile cues General Comments: Patient able to follow commands with increased time and some cueing, patient did demonstrate some insightful behaviors such as reaching for chair, attempting to scoot and reposition in chair without cues    General Comments      Exercises        Assessment/Plan    PT Assessment Patient needs continued PT services  PT Diagnosis Difficulty walking;Generalized weakness;Altered mental status   PT Problem List Decreased strength;Decreased range of motion;Decreased activity tolerance;Decreased balance;Decreased mobility;Decreased coordination;Decreased cognition;Cardiopulmonary status limiting activity  PT Treatment Interventions DME instruction;Gait training;Functional mobility training;Therapeutic activities;Therapeutic exercise;Balance training;Patient/family education   PT Goals (Current goals can be found in the Care Plan section) Acute Rehab PT Goals Patient Stated Goal: to get back to baseline PT Goal Formulation: With patient/family Time For Goal Achievement: 07/15/14 Potential to Achieve Goals: Good    Frequency Min 3X/week   Barriers to discharge        Co-evaluation               End of Session Equipment Utilized During Treatment: Gait belt;Oxygen Activity Tolerance: Patient tolerated treatment well;Patient limited by fatigue Patient left: in chair;with call bell/phone within reach;with chair alarm set;with family/visitor present Nurse Communication: Mobility status;Need for lift equipment;Precautions         Time: 4401-02721009-1037 PT Time Calculation (min) (ACUTE ONLY): 28 min   Charges:   PT Evaluation $Initial PT Evaluation Tier I: 1 Procedure PT Treatments $Therapeutic Activity: 23-37 mins   PT G CodesFabio Asa:          Makina Skow J 07/01/2014, 11:34 AM Charlotte Crumbevon York Valliant, PT DPT  [phone]

## 2014-07-01 NOTE — Progress Notes (Signed)
PULMONARY / CRITICAL CARE MEDICINE HISTORY AND PHYSICAL EXAMINATION   Name: Roy Mitchell MRN: [id number] DOB: 14-Nov-1923    ADMISSION DATE:  06/26/2014  PRIMARY SERVICE: PCCM  CHIEF COMPLAINT:  Aphasia after recent fall.  BRIEF PATIENT DESCRIPTION:  [age] M with AVR July 2015 (on plavix) who presented two days after a fall in which he hit his head and found to have a subdural hematoma. Underwent evacuation 12/11. Remained intubated post op. PCCM primary service.   SIGNIFICANT EVENTS / STUDIES:  12/11 CT head: Left-sided subdural hematoma as discussed above. Compression of the sulci but no midline shift or herniation 12/11 CT head: Left parietal subdural hematoma is unchanged in size. Small interhemispheric subdural hematoma also unchanged. No shift of the midline structures 12/11 craniotomy and evacuation of SDH (Elsner). Remained intubated post op 12/12 CT head: Postoperative changes from interval craniotomy and subdural hematoma evacuation as above 12/13 Extubated. Increasing O2 reqts over course of day 12/14 CXR with total collapse of R lung. Comfortable on NRB mask. Chest percussion and NTS ordered 12/15 CXR unchanged. Bedside FOB performed. Moderate amount of very thick mucoid secretions removed from R mainstem bronchus 12/16 CXR reveals total re-expansion of R lung. Probable vascular congestion with small R effusion. Remains on high flow O2. Furosemide X 1. Empiric antibiotics initiated. Failed SLP swallow assessment for 3rd day in row. NGT placed for TFs  LINES / TUBES: ETT 12/11 >> 12/13 R IJ CVL 12/11 >>   CULTURES: Resp 12/15 >>   ANTIBIOTICS: Ceftriaxone 12/16 >>   SUBJECTIVE: RASS 0. + F/C more briskly. Speech remains garbled   VITAL SIGNS: Temp:  [97.4 F (36.3 C)-99.6 F (37.6 C)] 98.1 F (36.7 C) (12/16 1135) Pulse Rate:  [66-106] 106 (12/16 1300) Resp:  [15-32] 26 (12/16 1400) BP: (130-181)/(55-93) 158/69 mmHg (12/16 1400) SpO2:  [86 %-100 %] 98 % (12/16  1300) FiO2 (%):  [50 %-100 %] 55 % (12/16 1200) HEMODYNAMICS:   VENTILATOR SETTINGS: Vent Mode:  [-]  FiO2 (%):  [50 %-100 %] 55 % INTAKE / OUTPUT: Intake/Output      12/15 0701 - 12/16 0700 12/16 0701 - 12/17 0700   I.V. (mL/kg) 1200 (13.4) 226.7 (2.5)   Other     IV Piggyback 204.5 302.5   Total Intake(mL/kg) 1404.5 (15.7) 529.2 (5.9)   Urine (mL/kg/hr) 730 (0.3) 1545 (2.1)   Emesis/NG output     Total Output 730 1545   Net +674.5 -1015.8          PHYSICAL EXAMINATION: General:  NAD on NRB mask Neuro:  RASS 0. Not F/C, speech is unintelligible.  HEENT: WNL Neck: No JVD Cardiovascular:  Reg, no M Lungs: improved BS in R lung. + rhonchi Abdomen:  Soft, +BS Ext: warm, no edema  LABS: I have reviewed all of today's lab results. Relevant abnormalities are discussed in the A/P section  CXR: improved R lung aeration with small R effusion and probable vasc congestion  ASSESSMENT / PLAN: PULMONARY A: Acute Respiratory failure after craniotomy R lung collapse due to mucus plug - improved Small R pleural effusion Suspect mild pulmonary edema P:   Cont supp O2 to maintain SpO2 > 92% Cont chest percussion and PRN NTS  Empiric albuterol ordered 12/14 to help with mucociliary function Repeat CXR AM 12/17  CARDIOVASCULAR A: Recent AVR P:   Holding clopidogrel until resumption is OK'd by NS  Labetalol prn for SBP < 160  RENAL A: No acute issues P:   Monitor BMET  intermittently Monitor I/Os Correct electrolytes as indicated  GASTROINTESTINAL A: Dysphagia due to SDH P:   Place NGT 12/16 Begin TFs 12/16  HEMATOLOGIC A: Mild anemia without acute blood loss P:   DVT px: SCDs Monitor CBC intermittently Transfuse per usual ICU guidelines  INFECTIOUS A: Mucopurulent bronchitis P:   Micro and abx as above  ENDOCRINE A: Mild hyperglycemia without hx of DM P:   Initiate SSI with TFs  NEUROLOGIC A: Traumatic SDH,  s/p evacuation 12/11 Mild essential  tremor P:   Post op mgmt per NS   Family updated @ bedside. Watch in ICU at least 24 hrs more  Billy Fischeravid Keitra Carusone, MD ; [facility] [phone].  After 5:30 PM or weekends, call [phone]

## 2014-07-01 NOTE — Progress Notes (Addendum)
Speech Language Pathology Treatment: Dysphagia  Patient Details Name: Roy Mitchell MRN: [id number] DOB: August 06, 1923 Today's Date: 07/01/2014 Time: [id number] SLP Time Calculation (min) (ACUTE ONLY): 23 min  Assessment / Plan / Recommendation Clinical Impression  Pease order speech-language-cognitive assessment. Therapeutic intervention consisted of diagnostic evaluation of swallow function with daughters and wife present. Pt more alert, tracking SLP's movement, following several simple commands.  No vocalization other than during reflexive cough.  Pharyngeal dysphagia highly suspected with delayed, consistent cough after puree texture and ice chips.  Multiple swallows indicative of probable pharyngeal residue.  Pt. Progressing, however not ready for objective swallow assessment as outcome of NPO highly suspected. He may benefit from short term alternate nutrition in the interim.  SLP will continue to follow.   HPI HPI: 80 year old man with history of HTN, HLD, prediabetes, GERD, multinodular goiter, degenerative disc disease, bovine aortic valve replacement admitted with aphasia. Wednesday afternoon he had a mechanical fall while walking on the sidewalk. He hit the left side of his head. Denies LOC. CT Left-sided subdural hematoma.  Underwent left parietal craniotomy 06/26/14.  Intubated 12/11-12/14/15.   Pertinent Vitals Pain Assessment: No/denies pain  SLP Plan  Continue with current plan of care    Recommendations Diet recommendations: NPO Medication Administration: Via alternative means              Oral Care Recommendations: Oral care BID Follow up Recommendations:  (TBD) Plan: Continue with current plan of care    GO     Roy Mitchell, Roy Mitchell 07/01/2014, 9:55 AM  Roy Mitchell M.Ed [employer]-SLP Pager [phone]

## 2014-07-02 ENCOUNTER — Inpatient Hospital Stay (HOSPITAL_COMMUNITY): Payer: Medicare Other

## 2014-07-02 DIAGNOSIS — R1314 Dysphagia, pharyngoesophageal phase: Secondary | ICD-10-CM

## 2014-07-02 DIAGNOSIS — F418 Other specified anxiety disorders: Secondary | ICD-10-CM

## 2014-07-02 LAB — GLUCOSE, CAPILLARY
GLUCOSE-CAPILLARY: 145 mg/dL — AB (ref 70–99)
Glucose-Capillary: 132 mg/dL — ABNORMAL HIGH (ref 70–99)
Glucose-Capillary: 121 mg/dL — ABNORMAL HIGH (ref 70–99)
Glucose-Capillary: 142 mg/dL — ABNORMAL HIGH (ref 70–99)
Glucose-Capillary: 154 mg/dL — ABNORMAL HIGH (ref 70–99)
Glucose-Capillary: 137 mg/dL — ABNORMAL HIGH (ref 70–99)

## 2014-07-02 LAB — BASIC METABOLIC PANEL
ANION GAP: 11 (ref 5–15)
BUN: 28 mg/dL — ABNORMAL HIGH (ref 6–23)
CO2: 30 mEq/L (ref 19–32)
Calcium: 9.3 mg/dL (ref 8.4–10.5)
Chloride: 102 mEq/L (ref 96–112)
Creatinine, Ser: 0.53 mg/dL (ref 0.50–1.35)
GFR calc Af Amer: >90 mL/min (ref >90)
GFR calc non Af Amer: >90 mL/min (ref >90)
Glucose, Bld: 134 mg/dL — ABNORMAL HIGH (ref 70–99)
Potassium: 3.6 mEq/L — ABNORMAL LOW (ref 3.7–5.3)
Sodium: 143 mEq/L (ref 137–147)

## 2014-07-02 MED ORDER — LEVETIRACETAM 100 MG/ML PO SOLN
250.0000 mg | Freq: Two times a day (BID) | ORAL | Status: DC
  Administered 2014-07-02 – 2014-07-05 (×7): 250 mg
  Filled 2014-07-02 (×9): qty 2.5

## 2014-07-02 MED ORDER — LEVETIRACETAM 100 MG/ML PO SOLN
250.0000 mg | Freq: Two times a day (BID) | ORAL | Status: DC
  Filled 2014-07-02 (×2): qty 2.5

## 2014-07-02 NOTE — Progress Notes (Signed)
PULMONARY / CRITICAL CARE MEDICINE HISTORY AND PHYSICAL EXAMINATION   Name: Roy Mitchell MRN: [id number] DOB: June 26, 1924    ADMISSION DATE:  06/26/2014  PRIMARY SERVICE: PCCM  CHIEF COMPLAINT:  Aphasia after recent fall.  BRIEF PATIENT DESCRIPTION:  [age] M with AVR July 2015 (on plavix) who presented two days after a fall in which he hit his head and found to have a subdural hematoma. Underwent evacuation 12/11. Remained intubated post op. PCCM primary service.   SIGNIFICANT EVENTS / STUDIES:  12/11 CT head: Left-sided subdural hematoma as discussed above. Compression of the sulci but no midline shift or herniation 12/11 CT head: Left parietal subdural hematoma is unchanged in size. Small interhemispheric subdural hematoma also unchanged. No shift of the midline structures 12/11 craniotomy and evacuation of SDH (Elsner). Remained intubated post op 12/12 CT head: Postoperative changes from interval craniotomy and subdural hematoma evacuation as above 12/13 Extubated. Increasing O2 reqts over course of day 12/14 CXR with total collapse of R lung. Comfortable on NRB mask. Chest percussion and NTS ordered 12/15 CXR unchanged. Bedside FOB performed. Moderate amount of very thick mucoid secretions removed from R mainstem bronchus 12/16 CXR reveals total re-expansion of R lung. Probable vascular congestion with small R effusion. Remains on high flow O2. Furosemide X 1. Empiric antibiotics initiated. Failed SLP swallow assessment for 3rd day in row. NGT placed for TFs 12/17 Much improved neurologically. Speaking and F/C briskly. No resp distress. CIR consult requested  LINES / TUBES: ETT 12/11 >> 12/13 R IJ CVL 12/11 >> 12/17  CULTURES: Resp 12/15 >>   ANTIBIOTICS: Ceftriaxone 12/16 >>   SUBJECTIVE: Much improved neurologically. Speaking and F/C briskly. No resp distress  VITAL SIGNS: Temp:  [98.3 F (36.8 C)-99.3 F (37.4 C)] 98.8 F (37.1 C) (12/17 1148) Pulse Rate:  [68-84] 82  (12/17 1200) Resp:  [16-25] 24 (12/17 1200) BP: (124-177)/(51-109) 165/74 mmHg (12/17 1200) SpO2:  [88 %-100 %] 95 % (12/17 1331) FiO2 (%):  [31 %-55 %] 31 % (12/17 1331) Weight:  [87.1 kg (192 lb 0.3 oz)] 87.1 kg (192 lb 0.3 oz) (12/17 0400) HEMODYNAMICS:   VENTILATOR SETTINGS: Vent Mode:  [-]  FiO2 (%):  [31 %-55 %] 31 % INTAKE / OUTPUT: Intake/Output      12/16 0701 - 12/17 0700 12/17 0701 - 12/18 0700   I.V. (mL/kg) 316.7 (3.6) 60 (0.7)   Other 100    NG/GT 540 480   IV Piggyback 555 150   Total Intake(mL/kg) 1511.7 (17.4) 690 (7.9)   Urine (mL/kg/hr) 2700 (1.3) 295 (0.5)   Total Output 2700 295   Net -1188.3 +395          PHYSICAL EXAMINATION: General:  NAD  Neuro:  RASS 0. + F/C, MAEs  HEENT: WNL Neck: No JVD Cardiovascular:  Reg, no M Lungs: few rhonchi Abdomen:  Soft, +BS Ext: warm, no edema  LABS: I have reviewed all of today's lab results. Relevant abnormalities are discussed in the A/P section  CXR: biasilar atx/inf  ASSESSMENT / PLAN: PULMONARY A: Acute Respiratory failure after craniotomy R lung collapse due to mucus plug, resolved Small R pleural effusion, resolved Suspect mild pulmonary edema, resolved P:   Cont supp O2 to maintain SpO2 > 92% Cont chest percussion and PRN NTS  Cont nebulized albuterol Dc nebulized mucomyst Cont PRN NTS (though shouldn't require @ this point)  CARDIOVASCULAR A: Recent AVR P:   Holding clopidogrel until resumption is OK'd by NS  Labetalol prn for SBP <  160  RENAL A: No acute issues P:   Monitor BMET intermittently Monitor I/Os Correct electrolytes as indicated  GASTROINTESTINAL A: Dysphagia due to SDH P:   Cont TFs  SLP eval ongoing  HEMATOLOGIC A: Mild anemia without acute blood loss P:   DVT px: SCDs Monitor CBC intermittently Transfuse per usual guidelines  INFECTIOUS A: Mucopurulent bronchitis P:   Micro and abx as above  ENDOCRINE A: Mild hyperglycemia without hx of DM P:    Cont SSI with TFs  NEUROLOGIC A: Traumatic SDH,  s/p evacuation 12/11 Mild essential tremor P:   Post op mgmt per NS   Family updated @ bedside. Should be able to move to rehab any time. Transfer to SDU today  Billy Fischeravid Dahl Higinbotham, MD ; [facility] [phone].  After 5:30 PM or weekends, call [phone]

## 2014-07-02 NOTE — Progress Notes (Signed)
Medicare IM (Important Message) delivered to patient today by me in anticipation of discharge.   Meyli Boice, RN BSN M[facility] CCM  Case Manager, Trauma Service/Unit 3M [phone]  

## 2014-07-02 NOTE — Consult Note (Signed)
Physical Medicine and Rehabilitation Consult  Reason for Consult: Balance deficits, dysphagia, aphasia Referring Physician:  Dr. Sung AmabileSimonds.    HPI: Roy Mitchell is a 80 y.o. male with history of DDD, HTN, vertigo, tinnitus,TVAR, oxygen dependent at nights, PAD who sustained a fall 2 days PTA on 06/26/14 with new onset of word finding difficulty as well as difficulty using eating utensils. CT head done revealing acute left parietal SDH with 17mm thickness. Chronic plavix discontinued with plans for conservative care. Patient had decline in speech as well as right sided weakness later that day and was taken to OR by Dr. Danielle DessElsner for left parietal craniotomy for evacuation of subdural hematoma. Post op intubated till 12/13 but has issues with hypoxia requiring NRB mask. CXR with total right lung collapse and required bronchoscopy 12/15 to help clear mucous plugging.he was started on empiric antibiotics and BAL cultures pending.   Mentation has improved but patient without vocalization with dysphagia therefore remains NPO and panda tube feeds to be initiated today. PT evaluation done yesterday and CIR recommended by rehab team and MD.   Very dysarthric, wet vocal quality, not intelligible Nothing by mouth Neuro ICU nurse states patient still desaturating but can be cued to cough which improved saturations. Still on 31% O2 with mask  Review of Systems  HENT: Positive for tinnitus. Negative for hearing loss.   Respiratory: Positive for cough. Negative for shortness of breath.        PNA X 2 in past few months.   Cardiovascular: Negative for chest pain and palpitations.  Gastrointestinal: Negative for heartburn and abdominal pain.  Musculoskeletal: Positive for joint pain (chronic right shoulder pain). Negative for myalgias.       Balance deficits.   Neurological: Positive for dizziness, focal weakness and weakness.      Past Medical History  Diagnosis Date  . Hypercholesterolemia   .  Benign prostatic hypertrophy   . GERD (gastroesophageal reflux disease)   . DDD (degenerative disc disease), cervical   . Tinnitus   . Multinodular goiter   . Prediabetes   . DDD (degenerative disc disease), lumbar   . Hypertension   . Pulmonary nodules   . Anxiety disorder     Past Surgical History  Procedure Laterality Date  . Aortic valve replacement  01/2014  . History of blood transfusion    . Hernia repair      6 months old  . Craniotomy Left 06/26/2014    Procedure: Left Parietal Craniotomy for Subdural Hematoma;  Surgeon: Barnett AbuHenry Elsner, MD;  [facility];  Service: Neurosurgery;  Laterality: Left;    Family History  Problem Relation Age of Onset  . Congestive Heart Failure Mother     Social History:  Married. Had just completed months of HH therapy and in process of transitioning to cardiac rehab. Independent for ADL and ambulation but required assistance with sit to stand tranfers. Per reports that he has quit smoking years ago. He has never used smokeless tobacco. He reports that he does not drink alcohol or use illicit drugs.   Allergies  Allergen Reactions  . Ace Inhibitors Cough  . Penicillins Rash    Medications Prior to Admission  Medication Sig Dispense Refill  . CALCIUM-VITAMIN D PO Take 600 mg by mouth daily.    . clopidogrel (PLAVIX) 75 MG tablet Take 75 mg by mouth daily.    Marland Kitchen. escitalopram (LEXAPRO) 5 MG tablet Take 5 mg by mouth daily.    .Marland Kitchen  finasteride (PROSCAR) 5 MG tablet Take 5 mg by mouth daily.    . Fluticasone-Salmeterol (ADVAIR) 250-50 MCG/DOSE AEPB Inhale 1 puff into the lungs 2 (two) times daily.    Marland Kitchen. ibuprofen (ADVIL,MOTRIN) 200 MG tablet Take 200 mg by mouth every 6 (six) hours as needed.    Marland Kitchen. LORazepam (ATIVAN) 2 MG tablet Take 2 mg by mouth daily. 1 1/2 tablet daily at bedtime    . Multiple Vitamins-Minerals (CENTRUM SILVER PO) Take 1 tablet by mouth daily.    . pantoprazole (PROTONIX) 20 MG tablet Take 20 mg by mouth daily.      . Polyethylene Glycol 3350 (MIRALAX PO) Take by mouth daily. As needed    . simvastatin (ZOCOR) 40 MG tablet Take 40 mg by mouth daily.    . tamsulosin (FLOMAX) 0.4 MG CAPS capsule Take 0.4 mg by mouth at bedtime.    . Vitamins-Lipotropics (LIPO-FLAVONOID PLUS PO) Take by mouth daily.    Marland Kitchen. albuterol (PROVENTIL HFA;VENTOLIN HFA) 108 (90 BASE) MCG/ACT inhaler Inhale 2 puffs into the lungs every 6 (six) hours as needed for wheezing or shortness of breath.    Marland Kitchen. albuterol (PROVENTIL) (2.5 MG/3ML) 0.083% nebulizer solution Take 2.5 mg by nebulization every 6 (six) hours as needed for wheezing or shortness of breath.    . dextromethorphan-guaiFENesin (MUCINEX DM) 30-600 MG per 12 hr tablet Take 1 tablet by mouth 2 (two) times daily.    . Fluticasone-Salmeterol (ADVAIR) 250-50 MCG/DOSE AEPB Inhale 1 puff into the lungs 2 (two) times daily.      Home: Home Living Family/patient expects to be discharged to:: Inpatient rehab Living Arrangements: Spouse/significant other Available Help at Discharge: Family Type of Home: House Home Access: Ramped entrance Home Layout: One level Home Equipment: Environmental consultantWalker - 2 wheels, Howardane - single point, Wheelchair - manual, Hospital bed Additional Comments: walk in shower  Functional History: Prior Function Level of Independence: Independent with assistive device(s) Comments: was walking with use of a cane Functional Status:  Mobility: Bed Mobility Overal bed mobility: Needs Assistance, +2 for physical assistance Bed Mobility: Supine to Sit Supine to sit: Max assist, +2 for physical assistance, HOB elevated General bed mobility comments: able to intiate some LE movement and use of LUE for positioning and propping, utilized helicopter technique with chuck bed Transfers Overall transfer level: Needs assistance Equipment used:  (+2 face to face with chuck pad and gait belt) Transfers: Sit to/from Stand, Stand Pivot Transfers Sit to Stand: Max assist, +2 physical  assistance Stand pivot transfers: Max assist, +2 physical assistance General transfer comment: Able to take some weight through BLE, unable to reach complete upright despite full physical assist and bilateral LE knee blocking      ADL:    Cognition: Cognition Overall Cognitive Status: Impaired/Different from baseline Orientation Level: Other (comment) (UTA d/t aphasia) Cognition Arousal/Alertness: Awake/alert Behavior During Therapy: Flat affect Overall Cognitive Status: Impaired/Different from baseline Area of Impairment: Following commands, Safety/judgement, Awareness, Problem solving Following Commands: Follows one step commands consistently, Follows one step commands with increased time Awareness: Intellectual, Emergent (emerging emergent ) Problem Solving: Slow processing, Decreased initiation, Difficulty sequencing, Requires verbal cues, Requires tactile cues General Comments: Patient able to follow commands with increased time and some cueing, patient did demonstrate some insightful behaviors such as reaching for chair, attempting to scoot and reposition in chair without cues  Blood pressure 158/65, pulse 73, temperature 99.3 F (37.4 C), temperature source Axillary, resp. rate 25, height 5\' 6"  (1.676 m), weight 87.1 kg (  192 lb 0.3 oz), SpO2 91 %. Physical Exam  Nursing note and vitals reviewed. Constitutional: He appears well-developed and well-nourished. Face mask in place.  Fatigued appearing. Panda in right nares.   HENT:  Head: Normocephalic.  Left crani incision with dry dressing.   Eyes: Conjunctivae are normal. Pupils are equal, round, and reactive to light.  Neck: Normal range of motion. Neck supple.  Cardiovascular: Normal rate and regular rhythm.   Respiratory: Effort normal. He has rhonchi. He has rales in the right lower field. He exhibits no tenderness.  Congested coughing episode during exam.   GI: Soft. Bowel sounds are normal.  Musculoskeletal: He  exhibits no tenderness.  Neurological: He is alert.  Delayed processing. Makes eye contact and able to follow simple motor commands with verbal/visual cues. Perseveration noted. Left inattention.  Right hemiparesis with apraxia. Unable to phonate.   Skin: Skin is warm and dry.  Psychiatric: He is slowed.    Results for orders placed or performed during the hospital encounter of 06/26/14 (from the past 24 hour(s))  Glucose, capillary     Status: Abnormal   Collection Time: 07/01/14  4:49 PM  Result Value Ref Range   Glucose-Capillary 128 (H) 70 - 99 mg/dL  Glucose, capillary     Status: Abnormal   Collection Time: 07/01/14  8:19 PM  Result Value Ref Range   Glucose-Capillary 130 (H) 70 - 99 mg/dL  Glucose, capillary     Status: Abnormal   Collection Time: 07/01/14 11:41 PM  Result Value Ref Range   Glucose-Capillary 132 (H) 70 - 99 mg/dL  Glucose, capillary     Status: Abnormal   Collection Time: 07/02/14  3:24 AM  Result Value Ref Range   Glucose-Capillary 121 (H) 70 - 99 mg/dL  Basic metabolic panel     Status: Abnormal   Collection Time: 07/02/14  5:00 AM  Result Value Ref Range   Sodium 143 137 - 147 mEq/L   Potassium 3.6 (L) 3.7 - 5.3 mEq/L   Chloride 102 96 - 112 mEq/L   CO2 30 19 - 32 mEq/L   Glucose, Bld 134 (H) 70 - 99 mg/dL   BUN 28 (H) 6 - 23 mg/dL   Creatinine, Ser 1.61 0.50 - 1.35 mg/dL   Calcium 9.3 8.4 - 09.6 mg/dL   GFR calc non Af Amer >90 >90 mL/min   GFR calc Af Amer >90 >90 mL/min   Anion gap 11 5 - 15  Glucose, capillary     Status: Abnormal   Collection Time: 07/02/14  8:18 AM  Result Value Ref Range   Glucose-Capillary 145 (H) 70 - 99 mg/dL   Comment 1 Notify RN    Comment 2 Documented in Chart    Dg Chest Port 1 View  07/02/2014   CLINICAL DATA:  Respiratory failure.  EXAM: PORTABLE CHEST - 1 VIEW  COMPARISON:  07/01/2014.  FINDINGS: Interim placement of feeding tube, with tip is below hemidiaphragm. Right IJ line stable position. Interval  improvement of bibasilar infiltrates. Small left pleural effusion. No pneumothorax. Stable cardiomegaly normal pulmonary vascularity. Cardiac valve replacement. No acute osseous abnormality.  IMPRESSION: 1. Interim placement of feeding tube, its tip is below the hemidiaphragm. Right IJ line in stable position. 2. Interval improvement of bibasilar infiltrates. Small left pleural effusion. 3. Stable cardiomegaly.  Cardiac valve replacement.   Electronically Signed   By: Maisie Fus  Register   On: 07/02/2014 07:21   Dg Chest Port 1 View  07/01/2014  CLINICAL DATA:  Respiratory failure  EXAM: PORTABLE CHEST - 1 VIEW  COMPARISON:  Portable chest x-ray of 06/30/2014  FINDINGS: Aeration of the right lung has improved significantly. There is still opacity at the right lung base consistent with atelectasis, effusion, and possibly pneumonia. Cardiomegaly is stable. Right IJ stent central venous line is unchanged.  IMPRESSION: Improved aeration of the right lung with persistent right basilar opacity consistent with atelectasis, effusion, and possibly pneumonia.   Electronically Signed   By: Dwyane Dee M.D.   On: 07/01/2014 07:59   Dg Abd Portable 1v  07/01/2014   CLINICAL DATA:  Evaluate feeding tube placement  EXAM: PORTABLE ABDOMEN - 1 VIEW  COMPARISON:  07/01/2014 at 1643 hr  FINDINGS: Weighted feeding tube terminates in the gastric fundus.  Nonobstructive bowel gas pattern.  Moderate colonic stool burden.  IMPRESSION: Weighted feeding tube terminates in the gastric fundus.   Electronically Signed   By: Charline Bills M.D.   On: 07/01/2014 18:04   Dg Abd Portable 1v  07/01/2014   CLINICAL DATA:  Feeding tube placement. Left craniotomy for subdural hematoma 06/26/2014.  EXAM: PORTABLE ABDOMEN - 1 VIEW  COMPARISON:  One day prior  FINDINGS: Non-obstructive bowel gas pattern. Feeding tube terminates in the inferior left hemi thorax, likely within the left lung. Patchy bibasilar airspace disease. Right internal  jugular line terminates at the low SVC. No gross pneumothorax identified. Aortic valve replacement. Nasogastric tube removed.  IMPRESSION: Feeding tube projecting over the inferior left chest, likely within the left lung. These results will be called to the ordering clinician or representative by the Radiologist Assistant, and communication documented in the PACS or zVision Dashboard.  Bibasilar airspace disease, as detailed on today's chest radiograph.   Electronically Signed   By: Jeronimo Greaves M.D.   On: 07/01/2014 17:12    Assessment/Plan: Diagnosis: Left parietal subdural hematoma after fall on anticoagulants 1. Does the need for close, 24 hr/day medical supervision in concert with the patient's rehab needs make it unreasonable for this patient to be served in a less intensive setting? Yes 2. Co-Morbidities requiring supervision/potential complications: History of aortic stenosis status post TVA R, hypertension,Severe despite she had, severe dysarthria, 3. Due to bladder management, bowel management, safety, skin/wound care, disease management, medication administration, pain management and patient education, does the patient require 24 hr/day rehab nursing? Yes 4. Does the patient require coordinated care of a physician, rehab nurse, PT (12 hrs/day, 5 days/week), OT (1-2 hrs/day, 5 days/week) and SLP (0.5-1 hrs/day, 5 days/week) to address physical and functional deficits in the context of the above medical diagnosis(es)? Yes Addressing deficits in the following areas: balance, endurance, locomotion, strength, transferring, bowel/bladder control, bathing, dressing, feeding, grooming, toileting, cognition, speech, language, swallowing and psychosocial support 5. Can the patient actively participate in an intensive therapy program of at least 3 hrs of therapy per day at least 5 days per week? No 6. The potential for patient to make measurable gains while on inpatient rehab is good once his endurance  improves 7. Anticipated functional outcomes upon discharge from inpatient rehab are supervision and min assist  with PT, supervision and min assist with OT, supervision and min assist with SLP. 8. Estimated rehab length of stay to reach the above functional goals is: 20-25 days 9. Does the patient have adequate social supports and living environment to accommodate these discharge functional goals? Potentially 10. Anticipated D/C setting: Home 11. Anticipated post D/C treatments: HH therapy 12. Overall Rehab/Functional Prognosis: good  RECOMMENDATIONS: This patient's condition is appropriate for continued rehabilitative care in the following setting: CIR once endurance improves,  still requiring intensive respiratory monitoring Patient has agreed to participate in recommended program. N/A Note that insurance prior authorization may be required for reimbursement for recommended care.  Comment: Admissions coordinator to monitor progress while in acute care    07/02/2014

## 2014-07-02 NOTE — Progress Notes (Signed)
Pt arrived to unit accompanied by RN from [id number]. Pt oriented to room and unit, VS stable. No s/s of acute distress noted.

## 2014-07-02 NOTE — Progress Notes (Signed)
NUTRITION FOLLOW-UP  DOCUMENTATION CODES Per approved criteria  -Obesity Unspecified   INTERVENTION:  Vital AF 1.2@ 60 ml/hr.   Provides: 1728 kcal, 108 grams protein, and 1167 ml H2O. Total free water: 1767 ml  NUTRITION DIAGNOSIS: Inadequate oral intake related to inability to eat as evidenced by NPO status; ongoing.   Goal: Pt to meet >/= 90% of their estimated nutrition needs   Monitor:  TF order vs diet order, total protein/energy intake, labs, weights  ASSESSMENT: 80 y/o man with vascular disease, s/p AVR July of '15 (on plavix) who presented two days after a fall in which he hit his head but without LOC. The next morning he had some slurring of speech and word finding difficulty and was brought to the ED where he was found to have a subdural hematoma. Follow-up CT was negative for extension of the hematoma, but his exam declined, so he was taken to the OR for decompression with clot evacuation  Pt extubated 12/13. Per CXR pt with total collapse of right lung. Pt currently on mask for O2.  Feeding tube placed, xray pending. Pt discussed during ICU rounds and with RN.  Potassium low and on supplementation.  Free water: 200 ml TID = 600 ml  Height: Ht Readings from Last 1 Encounters:  06/26/14 5\' 6"  (1.676 m)    Weight: Wt Readings from Last 1 Encounters:  07/02/14 192 lb 0.3 oz (87.1 kg)  Admission weight: 205 lb (92.9 kg) 12/11  BMI:  Body mass index is 31.01 kg/(m^2).  Estimated Nutritional Needs: Kcal: 1600-1800 Protein: 90-110 grams Fluid: >/= 1800 ml daily  Skin: surgical incision on head  Diet Order: Diet NPO time specified   Intake/Output Summary (Last 24 hours) at 07/02/14 1159 Last data filed at 07/02/14 1139  Gross per 24 hour  Intake 1442.5 ml  Output   2650 ml  Net -1207.5 ml    Last BM: PTA   Labs:   Recent Labs Lab 06/28/14 0245 06/29/14 0414 06/30/14 0500 07/01/14 0510 07/02/14 0500  NA 134* 138 140 142 143  K 3.8 3.8 3.7  3.5* 3.6*  CL 97 100 101 102 102  CO2 25 26 29 29 30   BUN 18 23 26* 22 28*  CREATININE 0.65 0.57 0.60 0.54 0.53  CALCIUM 8.6 8.9 9.0 9.1 9.3  MG 1.9 2.3  --   --   --   PHOS 2.5 2.3  --   --   --   GLUCOSE 155* 131* 167* 150* 134*    CBG (last 3)   Recent Labs  07/01/14 2341 07/02/14 0324 07/02/14 0818  GLUCAP 132* 121* 145*    Scheduled Meds: . albuterol  2.5 mg Nebulization Q6H  . antiseptic oral rinse  7 mL Mouth Rinse q12n4p  . cefTRIAXone (ROCEPHIN)  IV  1 g Intravenous Q24H  . chlorhexidine  15 mL Mouth Rinse BID  . free water  200 mL Per Tube 3 times per day  . insulin aspart  0-9 Units Subcutaneous 6 times per day  . levETIRAcetam  250 mg Per Tube BID    Continuous Infusions: . sodium chloride 10 mL/hr at 07/02/14 1100  . feeding supplement (VITAL AF 1.2 CAL) 1,000 mL (07/02/14 1100)   Kendell BaneHeather Eliana Lueth RD, LDN, CNSC [phone] Pager [phone] After Hours Pager

## 2014-07-02 NOTE — Progress Notes (Signed)
Physical Therapy Treatment Patient Details Name: Roy Mitchell Vo MRN: [id number] DOB: 1924-01-23 Today's Date: 07/02/2014    History of Present Illness 80 year old man with history of HTN, HLD, prediabetes, GERD, multinodular goiter, degenerative disc disease, bovine aortic valve replacement admitted with aphasia. Wednesday afternoon he had a mechanical fall while walking on the sidewalk. He hit the left side of his head. Denies LOC. CT Left-sided subdural hematoma.  Underwent left parietal craniotomy 06/26/14.  Intubated 12/11-12/13/15.    PT Comments    Pt continues to require 2 person A for all mobility.  Pt attempts to A with mobility, though needs increased time for following directions.  Pt does nod head at times, but unsure of reliability.  Will continue to follow.    Follow Up Recommendations  CIR     Equipment Recommendations   (TBD)    Recommendations for Other Services       Precautions / Restrictions Precautions Precautions: Fall Restrictions Weight Bearing Restrictions: No    Mobility  Bed Mobility Overal bed mobility: Needs Assistance;+2 for physical assistance Bed Mobility: Supine to Sit     Supine to sit: Max assist;+2 for physical assistance     General bed mobility comments: pt participates with movement of LEs and needs 2 person A for LEs and trunk.    Transfers Overall transfer level: Needs assistance Equipment used: 2 person hand held assist Transfers: Sit to/from UGI CorporationStand;Stand Pivot Transfers Sit to Stand: Max assist;+2 physical assistance Stand pivot transfers: Max assist;+2 physical assistance       General transfer comment: pt needs Bil LEs blocked and has difficulty with knee extension during WBing.    Ambulation/Gait                 Stairs            Wheelchair Mobility    Modified Rankin (Stroke Patients Only) Modified Rankin (Stroke Patients Only) Pre-Morbid Rankin Score: No significant disability Modified Rankin:  Severe disability     Balance Overall balance assessment: Needs assistance Sitting-balance support: Bilateral upper extremity supported;Feet supported Sitting balance-Leahy Scale: Poor Sitting balance - Comments: pt utilizes UEs, however continues to require MinA to maintain balance.     Standing balance support: During functional activity Standing balance-Leahy Scale: Zero                      Cognition Arousal/Alertness: Awake/alert Behavior During Therapy: Flat affect Overall Cognitive Status: Difficult to assess Area of Impairment: Following commands       Following Commands: Follows one step commands with increased time            Exercises      General Comments        Pertinent Vitals/Pain Pain Assessment: No/denies pain    Home Living                      Prior Function            PT Goals (current goals can now be found in the care plan section) Acute Rehab PT Goals Patient Stated Goal: to get back to baseline PT Goal Formulation: With patient/family Time For Goal Achievement: 07/15/14 Potential to Achieve Goals: Good Progress towards PT goals: Progressing toward goals    Frequency  Min 3X/week    PT Plan Current plan remains appropriate    Co-evaluation             End of Session Equipment Utilized During  Treatment: Gait belt;Oxygen Activity Tolerance: Patient limited by fatigue Patient left: in chair;with call bell/phone within reach     Time: 0836-0900 PT Time Calculation (min) (ACUTE ONLY): 24 min  Charges:  $Therapeutic Activity: 23-37 mins                    G CodesSunny Schlein:      Faizah Kandler F, [place] CarolinaPT [phone] 07/02/2014, 10:50 AM

## 2014-07-02 NOTE — Progress Notes (Signed)
Called wife Bevely PalmerCatherine Cupit and informed her that pt will be transferring to 3S10 and that he is doing well. Also informed daughter-in-law, Ander SladeJoy. Santina EvansCatherine stated that she will let Larita FifeLynn, daughter, know about the room change.

## 2014-07-03 ENCOUNTER — Inpatient Hospital Stay (HOSPITAL_COMMUNITY): Payer: Medicare Other

## 2014-07-03 LAB — CULTURE, BAL-QUANTITATIVE W GRAM STAIN

## 2014-07-03 LAB — GLUCOSE, CAPILLARY
Glucose-Capillary: 117 mg/dL — ABNORMAL HIGH (ref 70–99)
Glucose-Capillary: 137 mg/dL — ABNORMAL HIGH (ref 70–99)
Glucose-Capillary: 141 mg/dL — ABNORMAL HIGH (ref 70–99)
Glucose-Capillary: 152 mg/dL — ABNORMAL HIGH (ref 70–99)
Glucose-Capillary: 158 mg/dL — ABNORMAL HIGH (ref 70–99)
Glucose-Capillary: 159 mg/dL — ABNORMAL HIGH (ref 70–99)

## 2014-07-03 LAB — CULTURE, BAL-QUANTITATIVE

## 2014-07-03 MED ORDER — POTASSIUM CHLORIDE 20 MEQ/15ML (10%) PO SOLN
40.0000 meq | Freq: Once | ORAL | Status: AC
  Administered 2014-07-03: 40 meq
  Filled 2014-07-03: qty 30

## 2014-07-03 MED ORDER — FREE WATER
100.0000 mL | Freq: Three times a day (TID) | Status: DC
  Administered 2014-07-03 – 2014-07-08 (×12): 100 mL

## 2014-07-03 MED ORDER — HYDRALAZINE HCL 20 MG/ML IJ SOLN
10.0000 mg | Freq: Once | INTRAMUSCULAR | Status: AC
  Administered 2014-07-03: 10 mg via INTRAVENOUS
  Filled 2014-07-03: qty 1

## 2014-07-03 MED ORDER — ACETAMINOPHEN 160 MG/5ML PO SOLN
650.0000 mg | ORAL | Status: DC | PRN
  Administered 2014-07-03 – 2014-07-05 (×3): 650 mg
  Filled 2014-07-03 (×3): qty 20.3

## 2014-07-03 NOTE — Progress Notes (Signed)
Unable to go down for routine head CT d/t pt's liable respiratory status and high BPs. Awaiting PRN BP meds to take affect. Will continue to monitor pt.

## 2014-07-03 NOTE — Progress Notes (Signed)
NGT in correct place, received orders from Dr. Molli KnockYacoub to resume TF. Also notified Dr. Molli KnockYacoub of pt's SBP >160 despite receiving 50mg  of labetalol, received orders for IV hydralazine. Will administer and continue to monitor.

## 2014-07-03 NOTE — Progress Notes (Signed)
Speech Language Pathology Treatment: Dysphagia  Patient Details Name: Roy Mitchell MRN: [id number] DOB: 16-Dec-1923 Today's Date: 07/03/2014 Time: [id number] SLP Time Calculation (min) (ACUTE ONLY): 15 min  Assessment / Plan / Recommendation Clinical Impression  Pt not appropriate today for PO trials nor instrumental swallow study - more lethargic, increased respiratory needs - requiring ventimask.  Wife and dtr present - educated re: plan for dysphagia management, necessity of frequent oral care while pt is NPO (now with TF).  Demonstrated to daughter-in-law how to provide oral care.  Pt with no purposeful effort to speak during session; answered simple questions re: care with head nod; no spontaneous swallowing occurred in response to oral care.   Will follow for improvements and readiness for instrumental study.  Family in agreement.    HPI HPI: 80 year old man with history of HTN, HLD, prediabetes, GERD, multinodular goiter, degenerative disc disease, bovine aortic valve replacement admitted with aphasia. Wednesday afternoon he had a mechanical fall while walking on the sidewalk. He hit the left side of his head. Denies LOC. CT Left-sided subdural hematoma.  Underwent left parietal craniotomy 06/26/14.  Intubated 12/11-12/14/15.   Pertinent Vitals Pain Assessment: Faces Faces Pain Scale: No hurt  SLP Plan  Continue with current plan of care    Recommendations Diet recommendations: NPO Medication Administration: Via alternative means              Oral Care Recommendations: Oral care Q4 per protocol Follow up Recommendations:  (tba) Plan: Continue with current plan of care   Roy Mitchell, [place] CCC/SLP Pager [phone]      Roy Mitchell, Roy Mitchell 07/03/2014, 1:24 PM

## 2014-07-03 NOTE — Progress Notes (Signed)
S/P NTS pt's lungs clear, tube placement verified via auscultation and TF restarted. Upon resuming TF pt's lungs began to sound very coarse. TF stopped and Dr. Molli KnockYacoub notified. Received orders for KUB to check NGT placement. Awaiting KUB.

## 2014-07-03 NOTE — Clinical Documentation Improvement (Signed)
"  Suspect mild pulmonary edema" documented by Dr. Sung AmabileSimonds 07/01/14.  Lasix 40 mg IV x 1 on 07/01/14.  Please document the ACUITY of the patient's pulmonary edema:   Thank You, Jerral Ralphathy R Danyella Mcginty ,RN Clinical Documentation Specialist:  [phone] [facility]- Health Information Management

## 2014-07-03 NOTE — Progress Notes (Signed)
PULMONARY / CRITICAL CARE MEDICINE HISTORY AND PHYSICAL EXAMINATION   Name: Roy Mitchell MRN: [id number] DOB: [birth year]    ADMISSION DATE:  06/26/2014  PRIMARY SERVICE: PCCM  CHIEF COMPLAINT:  Aphasia after recent fall.  BRIEF PATIENT DESCRIPTION:  [age] M with AVR July 2015 (on plavix) who presented two days after a fall in which he hit his head and found to have a subdural hematoma. Underwent evacuation 12/11. Remained intubated post op. PCCM primary service.   SIGNIFICANT EVENTS / STUDIES:  12/11 CT head: Left-sided subdural hematoma as discussed above. Compression of the sulci but no midline shift or herniation 12/11 CT head: Left parietal subdural hematoma is unchanged in size. Small interhemispheric subdural hematoma also unchanged. No shift of the midline structures 12/11 craniotomy and evacuation of SDH (Elsner). Remained intubated post op 12/12 CT head: Postoperative changes from interval craniotomy and subdural hematoma evacuation as above 12/13 Extubated. Increasing O2 reqts over course of day 12/14 CXR with total collapse of R lung. Comfortable on NRB mask. Chest percussion and NTS ordered 12/15 CXR unchanged. Bedside FOB performed. Moderate amount of very thick mucoid secretions removed from R mainstem bronchus 12/16 CXR reveals total re-expansion of R lung. Probable vascular congestion with small R effusion. Remains on high flow O2. Furosemide X 1. Empiric antibiotics initiated. Failed SLP swallow assessment for 3rd day in row. NGT placed for TFs 12/17 Much improved neurologically. Speaking and F/C briskly. No resp distress. CIR consult requested 12/18 more lethargic, febrile and increasing O2 reqts. Made DNR after extended discussion with pt, wife, daughter and daughter in law  LINES / TUBES: ETT 12/11 >> 12/13 R IJ CVL 12/11 >> 12/17  CULTURES: Resp 12/15 >> NOF  ANTIBIOTICS: Ceftriaxone 12/16 >>   SUBJECTIVE: Lethargic, mildly dyspneic appearing.  febrile  VITAL SIGNS: Temp:  [98.7 F (37.1 C)-101.5 F (38.6 C)] 99.3 F (37.4 C) (12/18 1100) Pulse Rate:  [73-95] 78 (12/18 1100) Resp:  [18-32] 25 (12/18 1100) BP: (134-199)/(65-91) 157/71 mmHg (12/18 1100) SpO2:  [85 %-98 %] 95 % (12/18 1512) FiO2 (%):  [31 %-45 %] 45 % (12/18 1512) Weight:  [87.9 kg (193 lb 12.6 oz)] 87.9 kg (193 lb 12.6 oz) (12/18 0111) HEMODYNAMICS:   VENTILATOR SETTINGS: Vent Mode:  [-]  FiO2 (%):  [31 %-45 %] 45 % INTAKE / OUTPUT: Intake/Output      12/17 0701 - 12/18 0700 12/18 0701 - 12/19 0700   I.V. (mL/kg) 150 (1.7)    Other     NG/GT 960 200   IV Piggyback 150 50   Total Intake(mL/kg) 1260 (14.3) 250 (2.8)   Urine (mL/kg/hr) 1750 (0.8) 17.5 (0)   Emesis/NG output 60 (0) 420 (0.5)   Total Output 1810 437.5   Net -550 -187.5        Urine Occurrence 1 x    Stool Occurrence       PHYSICAL EXAMINATION: General:  NAD  Neuro:  RASS -1. + F/C sluggishly, MAEs  HEENT: post op changes Neck: No JVD noted Cardiovascular:  Reg, no M Lungs: increased rhonchi Abdomen:  Soft, +BS Ext: warm, no edema  LABS: I have reviewed all of today's lab results. Relevant abnormalities are discussed in the A/P section  CXR: NNF  ASSESSMENT / PLAN: PULMONARY A: Acute Respiratory failure after craniotomy R lung collapse due to mucus plug, resolved Small R pleural effusion, resolved Suspect mild pulmonary edema, resolved Poor cough mechanics P:   Cont supp O2 to maintain SpO2 > 92% Cont  PRN NTS  Cont nebulized albuterol Recheck CXR 12/19 AM  CARDIOVASCULAR A: Recent AVR P:   Holding clopidogrel until resumption is OK'd by NS  Labetalol prn for SBP < 160  RENAL A: No acute issues P:   Monitor BMET intermittently Monitor I/Os Correct electrolytes as indicated  GASTROINTESTINAL A: Dysphagia due to SDH P:   Cont NGT/TFs  SLP eval ongoing  HEMATOLOGIC A: Mild anemia without acute blood loss P:   DVT px: SCDs Monitor CBC  intermittently Transfuse per usual guidelines  INFECTIOUS A: Fever Mucopurulent bronchitis P:   Micro and abx as above  ENDOCRINE A: Mild hyperglycemia without hx of DM P:   Cont SSI with TFs  NEUROLOGIC A: Traumatic SDH,  s/p evacuation 12/11 Mild essential tremor P:   Post op mgmt per NS   Family updated @ bedside. See note above - DNR  [name], MD ; [facility] service Mobile [phone].  After 5:30 PM or weekends, call [phone]

## 2014-07-03 NOTE — Progress Notes (Signed)
I met with pt, his wife, and daughter in law at bedside. Pt not functionally ready for admission to inpt rehab this week. I will follow up next week to assist with dispo planning. [phone]

## 2014-07-03 NOTE — Progress Notes (Signed)
Placed patient on 50% venturi mask due to low Sp02 level. Nasal tracheal suctioned patient for copious amount of thick yellow sputum.

## 2014-07-04 ENCOUNTER — Inpatient Hospital Stay (HOSPITAL_COMMUNITY): Payer: Medicare Other

## 2014-07-04 DIAGNOSIS — Z66 Do not resuscitate: Secondary | ICD-10-CM | POA: Diagnosis not present

## 2014-07-04 LAB — BASIC METABOLIC PANEL
Anion gap: 8 (ref 5–15)
BUN: 32 mg/dL — ABNORMAL HIGH (ref 6–23)
CHLORIDE: 105 meq/L (ref 96–112)
CO2: 30 mEq/L (ref 19–32)
CREATININE: 0.6 mg/dL (ref 0.50–1.35)
Calcium: 9.1 mg/dL (ref 8.4–10.5)
GFR, EST NON AFRICAN AMERICAN: 87 mL/min — AB (ref >90)
Glucose, Bld: 150 mg/dL — ABNORMAL HIGH (ref 70–99)
Potassium: 3.8 mEq/L (ref 3.7–5.3)
Sodium: 143 mEq/L (ref 137–147)

## 2014-07-04 LAB — GLUCOSE, CAPILLARY
GLUCOSE-CAPILLARY: 184 mg/dL — AB (ref 70–99)
Glucose-Capillary: 130 mg/dL — ABNORMAL HIGH (ref 70–99)
Glucose-Capillary: 144 mg/dL — ABNORMAL HIGH (ref 70–99)
Glucose-Capillary: 144 mg/dL — ABNORMAL HIGH (ref 70–99)
Glucose-Capillary: 159 mg/dL — ABNORMAL HIGH (ref 70–99)
Glucose-Capillary: 175 mg/dL — ABNORMAL HIGH (ref 70–99)

## 2014-07-04 MED ORDER — MORPHINE SULFATE 2 MG/ML IJ SOLN
2.0000 mg | INTRAMUSCULAR | Status: DC | PRN
  Administered 2014-07-05 (×2): 2 mg via INTRAVENOUS
  Administered 2014-07-06 – 2014-07-07 (×8): 4 mg via INTRAVENOUS
  Administered 2014-07-07 – 2014-07-08 (×4): 2 mg via INTRAVENOUS
  Filled 2014-07-04 (×3): qty 1
  Filled 2014-07-04: qty 2
  Filled 2014-07-04: qty 1
  Filled 2014-07-04: qty 2
  Filled 2014-07-04: qty 1
  Filled 2014-07-04 (×2): qty 2
  Filled 2014-07-04: qty 1
  Filled 2014-07-04: qty 2
  Filled 2014-07-04 (×2): qty 1
  Filled 2014-07-04: qty 2
  Filled 2014-07-04: qty 1
  Filled 2014-07-04: qty 2

## 2014-07-04 MED ORDER — FUROSEMIDE 10 MG/ML IJ SOLN
40.0000 mg | Freq: Four times a day (QID) | INTRAMUSCULAR | Status: AC
  Administered 2014-07-04 (×2): 40 mg via INTRAVENOUS
  Filled 2014-07-04 (×2): qty 4

## 2014-07-04 MED ORDER — METHYLPREDNISOLONE SODIUM SUCC 40 MG IJ SOLR
40.0000 mg | Freq: Two times a day (BID) | INTRAMUSCULAR | Status: DC
Start: 2014-07-04 — End: 2014-07-07
  Administered 2014-07-04 – 2014-07-06 (×6): 40 mg via INTRAVENOUS
  Filled 2014-07-04 (×8): qty 1

## 2014-07-04 NOTE — Progress Notes (Signed)
PULMONARY / CRITICAL CARE MEDICINE HISTORY AND PHYSICAL EXAMINATION   Name: Theophilus KindsSherwood P Perrow MRN: [id number] DOB: September 21, 1923    ADMISSION DATE:  06/26/2014  PRIMARY SERVICE: PCCM  CHIEF COMPLAINT:  Aphasia after recent fall.  BRIEF PATIENT DESCRIPTION:  [age] M with AVR July 2015 (on plavix) who presented two days after a fall in which he hit his head and found to have a subdural hematoma. Underwent evacuation 12/11. Remained intubated post op. PCCM primary service.   SIGNIFICANT EVENTS / STUDIES:  12/11 CT head: Left-sided subdural hematoma as discussed above. Compression of the sulci but no midline shift or herniation 12/11 CT head: Left parietal subdural hematoma is unchanged in size. Small interhemispheric subdural hematoma also unchanged. No shift of the midline structures 12/11 craniotomy and evacuation of SDH (Elsner). Remained intubated post op 12/12 CT head: Postoperative changes from interval craniotomy and subdural hematoma evacuation as above 12/13 Extubated. Increasing O2 reqts over course of day 12/14 CXR with total collapse of R lung. Comfortable on NRB mask. Chest percussion and NTS ordered 12/15 CXR unchanged. Bedside FOB performed. Moderate amount of very thick mucoid secretions removed from R mainstem bronchus 12/16 CXR reveals total re-expansion of R lung. Probable vascular congestion with small R effusion. Remains on high flow O2. Furosemide X 1. Empiric antibiotics initiated. Failed SLP swallow assessment for 3rd day in row. NGT placed for TFs 12/17 Much improved neurologically. Speaking and F/C briskly. No resp distress. CIR consult requested 12/18 more lethargic, febrile and increasing O2 reqts. Made DNR after extended discussion with pt, wife, daughter and daughter in law  LINES / TUBES: ETT 12/11 >> 12/13 R IJ CVL 12/11 >> 12/17  CULTURES: Resp 12/15 >> NOF  ANTIBIOTICS: Ceftriaxone 12/16 >>   SUBJECTIVE: Remains lethargic   VITAL SIGNS: Temp:  [98.5 F  (36.9 C)-99.3 F (37.4 C)] 98.8 F (37.1 C) (12/19 0700) Pulse Rate:  [73-92] 78 (12/19 0839) Resp:  [20-31] 22 (12/19 0839) BP: (146-183)/(70-82) 176/82 mmHg (12/19 0839) SpO2:  [93 %-97 %] 95 % (12/19 0839) FiO2 (%):  [40 %-45 %] 40 % (12/19 0839) Weight:  [87.3 kg (192 lb 7.4 oz)] 87.3 kg (192 lb 7.4 oz) (12/19 0334)    VENTILATOR SETTINGS: Vent Mode:  [-]  FiO2 (%):  [40 %-45 %] 40 % INTAKE / OUTPUT: Intake/Output      12/18 0701 - 12/19 0700 12/19 0701 - 12/20 0700   I.V. (mL/kg) 120 (1.4)    NG/GT 1058    IV Piggyback 50    Total Intake(mL/kg) 1228 (14.1)    Urine (mL/kg/hr) 817.5 (0.4) 400 (2.1)   Emesis/NG output 420 (0.2)    Total Output 1237.5 400   Net -9.5 -400        Stool Occurrence 1 x      PHYSICAL EXAMINATION: General:  NAD  Neuro:  RASS -1. + F/C sluggishly, MAEs  HEENT: post op changes Neck: No JVD noted Cardiovascular:  Reg, no M Lungs: coarse rhonchi Abdomen:  Soft, +BS Ext: warm, no edema  LABS: I have reviewed all of today's lab results. Relevant abnormalities are discussed in the A/P section  CXR: 12/19 : RLL ATX but improved. pulm edema   ASSESSMENT / PLAN: PULMONARY A: Acute Respiratory failure after craniotomy R lung collapse due to mucus plug, resolved Small R pleural effusion, resolved Suspect mild pulmonary edema, resolved Poor cough mechanics P:   Cont supp O2 to maintain SpO2 > 92% Cont PRN NTS  Cont nebulized albuterol Recheck  CXR 12/20 AM Add steroids   CARDIOVASCULAR A: Recent AVR P:   Holding clopidogrel until resumption is OK'd by NS  Labetalol prn for SBP < 160  RENAL A: No acute issues P:   Monitor BMET intermittently Monitor I/Os Correct electrolytes as indicated  GASTROINTESTINAL A: Dysphagia due to SDH P:   Cont NGT/TFs  SLP eval ongoing  HEMATOLOGIC A: Mild anemia without acute blood loss P:   DVT px: SCDs Monitor CBC intermittently Transfuse per usual  guidelines  INFECTIOUS A: Fever Mucopurulent bronchitis P:   Micro and abx as above  ENDOCRINE A: Mild hyperglycemia without hx of DM P:   Cont SSI with TFs  NEUROLOGIC A: Traumatic SDH,  s/p evacuation 12/11 Mild essential tremor P:   Post op mgmt per NS   Family updated @ bedside. See note above - DNR  Dorcas Carrowatrick WrightMD Beeper  [phone]  Cell  [phone]  If no response or cell goes to voicemail, call beeper [phone]  9:12 AM 07/04/2014

## 2014-07-04 NOTE — Progress Notes (Signed)
Telemetry Monitoring called this RN to notify of patient having 12 beat run of V-tach. This RN verified strip. CCM paged. MD made aware. No new orders received. Will continue to monitor patient.

## 2014-07-04 NOTE — Progress Notes (Signed)
Patient ID: Roy Mitchell, male   DOB: 1924-07-03, 80 y.o.   MRN: [id number] Vital signs are stable Motor function remains about the same, speech function mostly nonverbal but occasional utterances. Discussed CT findings with family Indicated that though the clot may be slightly bigger than it had been a shift and mass effect all appear to be somewhat less are stable We'll continue supportive care Pulmonary issues noted

## 2014-07-05 ENCOUNTER — Inpatient Hospital Stay (HOSPITAL_COMMUNITY): Payer: Medicare Other

## 2014-07-05 DIAGNOSIS — Z66 Do not resuscitate: Secondary | ICD-10-CM

## 2014-07-05 LAB — CBC
HEMATOCRIT: 36.8 % — AB (ref 39.0–52.0)
Hemoglobin: 11.9 g/dL — ABNORMAL LOW (ref 13.0–17.0)
MCH: 27.4 pg (ref 26.0–34.0)
MCHC: 32.3 g/dL (ref 30.0–36.0)
MCV: 84.6 fL (ref 78.0–100.0)
Platelets: 254 10*3/uL (ref 150–400)
RBC: 4.35 MIL/uL (ref 4.22–5.81)
RDW: 16.6 % — ABNORMAL HIGH (ref 11.5–15.5)
WBC: 9.7 10*3/uL (ref 4.0–10.5)

## 2014-07-05 LAB — GLUCOSE, CAPILLARY
GLUCOSE-CAPILLARY: 153 mg/dL — AB (ref 70–99)
GLUCOSE-CAPILLARY: 167 mg/dL — AB (ref 70–99)
GLUCOSE-CAPILLARY: 179 mg/dL — AB (ref 70–99)
Glucose-Capillary: 160 mg/dL — ABNORMAL HIGH (ref 70–99)
Glucose-Capillary: 188 mg/dL — ABNORMAL HIGH (ref 70–99)
Glucose-Capillary: 157 mg/dL — ABNORMAL HIGH (ref 70–99)

## 2014-07-05 LAB — BASIC METABOLIC PANEL
Anion gap: 12 (ref 5–15)
BUN: 43 mg/dL — ABNORMAL HIGH (ref 6–23)
CHLORIDE: 102 meq/L (ref 96–112)
CO2: 29 mEq/L (ref 19–32)
CREATININE: 0.61 mg/dL (ref 0.50–1.35)
Calcium: 9.5 mg/dL (ref 8.4–10.5)
GFR calc Af Amer: >90 mL/min (ref >90)
GFR calc non Af Amer: 87 mL/min — ABNORMAL LOW (ref >90)
Glucose, Bld: 193 mg/dL — ABNORMAL HIGH (ref 70–99)
Potassium: 4 mEq/L (ref 3.7–5.3)
Sodium: 143 mEq/L (ref 137–147)

## 2014-07-05 NOTE — Progress Notes (Signed)
Patient c/o pain. Unable to give morphine due to not being enough time since last administration. Patient does have acetaminophen ordered but indications are for fever only. CCM paged. Per CCM- ok to give acetaminophen for pain.

## 2014-07-05 NOTE — Progress Notes (Signed)
Patient ID: Roy Mitchell, male   DOB: 06-13-1924, 80 y.o.   MRN: [id number] BP 158/72 mmHg  Pulse 71  Temp(Src) 97.7 F (36.5 C) (Axillary)  Resp 18  Ht 5\' 6"  (1.676 m)  Wt 94.6 kg (208 lb 8.9 oz)  BMI 33.68 kg/m2  SpO2 95% Alert, not following commands Moving extremities Dressing in position No changes

## 2014-07-05 NOTE — Progress Notes (Signed)
PULMONARY / CRITICAL CARE MEDICINE HISTORY AND PHYSICAL EXAMINATION   Name: Roy Mitchell MRN: [id number] DOB: [birth year]    ADMISSION DATE:  06/26/2014  PRIMARY SERVICE: PCCM  CHIEF COMPLAINT:  Aphasia after recent fall.  BRIEF PATIENT DESCRIPTION:  [age] M with AVR July 2015 (on plavix) who presented two days after a fall in which he hit his head and found to have a subdural hematoma. Underwent evacuation 12/11. Remained intubated post op. PCCM primary service.   SIGNIFICANT EVENTS / STUDIES:  12/11 CT head: Left-sided subdural hematoma as discussed above. Compression of the sulci but no midline shift or herniation 12/11 CT head: Left parietal subdural hematoma is unchanged in size. Small interhemispheric subdural hematoma also unchanged. No shift of the midline structures 12/11 craniotomy and evacuation of SDH (Elsner). Remained intubated post op 12/12 CT head: Postoperative changes from interval craniotomy and subdural hematoma evacuation as above 12/13 Extubated. Increasing O2 reqts over course of day 12/14 CXR with total collapse of R lung. Comfortable on NRB mask. Chest percussion and NTS ordered 12/15 CXR unchanged. Bedside FOB performed. Moderate amount of very thick mucoid secretions removed from R mainstem bronchus 12/16 CXR reveals total re-expansion of R lung. Probable vascular congestion with small R effusion. Remains on high flow O2. Furosemide X 1. Empiric antibiotics initiated. Failed SLP swallow assessment for 3rd day in row. NGT placed for TFs 12/17 Much improved neurologically. Speaking and F/C briskly. No resp distress. CIR consult requested 12/18 more lethargic, febrile and increasing O2 reqts. Made DNR after extended discussion with pt, wife, daughter and daughter in law  LINES / TUBES: ETT 12/11 >> 12/13 R IJ CVL 12/11 >> 12/17  CULTURES: Resp 12/15 >> NOF  ANTIBIOTICS: Ceftriaxone 12/16 >>   SUBJECTIVE: Pt more alert    VITAL SIGNS: Temp:  [97.1 F  (36.2 C)-98.9 F (37.2 C)] 97.7 F (36.5 C) (12/20 0830) Pulse Rate:  [66-84] 66 (12/20 0548) Resp:  [20-28] 21 (12/20 0356) BP: (121-171)/(54-84) 148/84 mmHg (12/20 0548) SpO2:  [89 %-97 %] 94 % (12/20 0830) FiO2 (%):  [40 %-50 %] 50 % (12/20 0830) Weight:  [94.6 kg (208 lb 8.9 oz)] 94.6 kg (208 lb 8.9 oz) (12/20 0400)    VENTILATOR SETTINGS: Vent Mode:  [-]  FiO2 (%):  [40 %-50 %] 50 % INTAKE / OUTPUT: Intake/Output      12/19 0701 - 12/20 0700 12/20 0701 - 12/21 0700   I.V. (mL/kg) 220 (2.3)    NG/GT 1350    IV Piggyback 50    Total Intake(mL/kg) 1620 (17.1)    Urine (mL/kg/hr) 3400 (1.5)    Emesis/NG output     Total Output 3400     Net -1780            PHYSICAL EXAMINATION: General:  NAD  Neuro:  RASS 0. + F/C  HEENT: post op changes Neck: No JVD noted Cardiovascular:  Reg, no M Lungs: coarse rhonchi but better  Abdomen:  Soft, +BS Ext: warm, no edema  LABS: I have reviewed all of today's lab results. Relevant abnormalities are discussed in the A/P section  CXR: 12/20 : RLL ATX but improved. pulm edema   ASSESSMENT / PLAN: Principal Problem:   SDH (subdural hematoma) Active Problems:   Abnormality of gait   HTN (hypertension)   Anxiety and depression   Acute respiratory failure with hypoxia   Acute respiratory failure   Lung collapse   DNR (do not resuscitate)   PULMONARY A: Acute Respiratory  failure after craniotomy R lung collapse due to mucus plug, resolved Small R pleural effusion, resolved Suspect mild pulmonary edema, resolved Poor cough mechanics P:   Cont supp O2 to maintain SpO2 > 92% Cont PRN NTS  Cont nebulized albuterol Cont  steroids   CARDIOVASCULAR A: Recent AVR P:   Holding clopidogrel until resumption is OK'd by NS  Labetalol prn for SBP < 160  RENAL A: No acute issues P:   Monitor BMET intermittently Monitor I/Os Correct electrolytes as indicated  GASTROINTESTINAL A: Dysphagia due to SDH P:   Cont NGT/TFs   SLP eval ongoing  HEMATOLOGIC A: Mild anemia without acute blood loss P:   DVT px: SCDs Monitor CBC intermittently Transfuse per usual guidelines  INFECTIOUS A: Fever Mucopurulent bronchitis P:   Micro and abx as above  ENDOCRINE A: Mild hyperglycemia without hx of DM P:   Cont SSI with TFs  NEUROLOGIC A: Traumatic SDH,  s/p evacuation 12/11 Mild essential tremor P:   Post op mgmt per NS   Family updated @ bedside. See note above - DNR  Dorcas Carrowatrick WrightMD Beeper  [phone]  Cell  [phone]  If no response or cell goes to voicemail, call beeper [phone]  10:28 AM 07/05/2014

## 2014-07-06 ENCOUNTER — Inpatient Hospital Stay (HOSPITAL_COMMUNITY): Payer: Medicare Other

## 2014-07-06 LAB — GLUCOSE, CAPILLARY
GLUCOSE-CAPILLARY: 144 mg/dL — AB (ref 70–99)
GLUCOSE-CAPILLARY: 170 mg/dL — AB (ref 70–99)
Glucose-Capillary: 141 mg/dL — ABNORMAL HIGH (ref 70–99)
Glucose-Capillary: 141 mg/dL — ABNORMAL HIGH (ref 70–99)
Glucose-Capillary: 146 mg/dL — ABNORMAL HIGH (ref 70–99)
Glucose-Capillary: 177 mg/dL — ABNORMAL HIGH (ref 70–99)

## 2014-07-06 MED ORDER — ACETAMINOPHEN 160 MG/5ML PO SOLN
650.0000 mg | ORAL | Status: DC | PRN
  Administered 2014-07-07: 650 mg
  Filled 2014-07-06: qty 20.3

## 2014-07-06 MED ORDER — SODIUM CHLORIDE 0.9 % IV SOLN
250.0000 mg | Freq: Two times a day (BID) | INTRAVENOUS | Status: DC
  Administered 2014-07-06 – 2014-07-08 (×5): 250 mg via INTRAVENOUS
  Filled 2014-07-06 (×5): qty 2.5

## 2014-07-06 NOTE — Progress Notes (Signed)
At 0400 when this writer checked Panda tube placement and residuals, tube placement did not seem appropriate. Tube feedings held. CCM paged. Dr. Deterdine responded and ordered abd xray. X ray did not visualize panda tube. CCM paged and new orders given to remove tube. Tube removed 0630. Pt will continue to be monitored.

## 2014-07-06 NOTE — Progress Notes (Addendum)
Medicare Important Message given? YES  (If response is "NO", the following Medicare IM given date fields will be blank)  Date Medicare IM given:  07/06/2014 Medicare IM given by: Isidoro DonningSHAVIS, Zamani Crocker

## 2014-07-06 NOTE — Progress Notes (Signed)
Speech Language Pathology Treatment: Dysphagia  Patient Details Name: Roy Mitchell P Dowding MRN: [id number] DOB: September 29, 1923 Today's Date: 07/06/2014 Time: [id number] SLP Time Calculation (min) (ACUTE ONLY): 17 min  Assessment / Plan / Recommendation Clinical Impression  Diagnostic treatment complete. Patient presents with a primarily cognitive based dysphagia at this time characterized by poor sustained attention and awareness resulting in prolonged and non-functional mastication of clinician provided boluses despite max verbal, visual, and tactile cueing to facilitate oral transit of bolus. No swallow initiated. Oral cavity suctioned. Education complete with wife and daughter regarding current function, prognosis, and plan. Once patient able to orally transit bolus and initiate a swallow consistently, suspect that patient will benefit from instrumental testing given h/o dysphagia with requirements for thickened liquids post heart surgery per wife.    HPI HPI: 80 year old man with history of HTN, HLD, prediabetes, GERD, multinodular goiter, degenerative disc disease, bovine aortic valve replacement admitted with aphasia. Wednesday afternoon he had a mechanical fall while walking on the sidewalk. He hit the left side of his head. Denies LOC. CT Left-sided subdural hematoma.  Underwent left parietal craniotomy 06/26/14.  Intubated 12/11-12/14/15.   Pertinent Vitals Pain Assessment: Faces Faces Pain Scale: No hurt  SLP Plan  Continue with current plan of care    Recommendations Diet recommendations: NPO Medication Administration: Via alternative means              Oral Care Recommendations:  (QID) Follow up Recommendations:  (TBD) Plan: Continue with current plan of care    GO   Ferdinand LangoLeah Abdulahad Mederos MA, CCC-SLP [phone]   Kamaile Zachow Meryl 07/06/2014, 11:33 AM

## 2014-07-06 NOTE — Progress Notes (Signed)
Utilization review complete. Meilech Virts RN CCM Case Mgmt phone [phone] 

## 2014-07-06 NOTE — Progress Notes (Signed)
Physical Therapy Treatment Patient Details Name: Roy Mitchell MRN: [id number] DOB: 1923-08-20 Today's Date: 07/06/2014    History of Present Illness 80 year old man with history of HTN, HLD, prediabetes, GERD, multinodular goiter, degenerative disc disease, bovine aortic valve replacement admitted with aphasia. Wednesday afternoon he had a mechanical fall while walking on the sidewalk. He hit the left side of his head. Denies LOC. CT Left-sided subdural hematoma.  Underwent left parietal craniotomy 06/26/14.  Intubated 12/11-12/13/15.    PT Comments    Pt continues to be able to follow most one step directions, but doesn't consistent with head nods for yes/no.  Pt on ventimask 55% on 15L during session with O2 sats remaining in mid 90's.  At this time pt in continuing to require extensive 2 person A for all mobility vs lift.  Will continue to follow.    Follow Up Recommendations  CIR     Equipment Recommendations  None recommended by PT    Recommendations for Other Services       Precautions / Restrictions Precautions Precautions: Fall Restrictions Weight Bearing Restrictions: No    Mobility  Bed Mobility Overal bed mobility: Needs Assistance;+2 for physical assistance Bed Mobility: Supine to Sit     Supine to sit: Max assist;+2 for physical assistance     General bed mobility comments: pt able to paricipate in mobility and requires A with Bil LEs and bringing trunk up to sitting.    Transfers Overall transfer level: Needs assistance Equipment used: 2 person hand held assist Transfers: Sit to/from [employer]GI CorporationStand;Stand Pivot Transfers Sit to Stand: Max assist;+2 physical assistance Stand pivot transfers: Max assist;+2 physical assistance       General transfer comment: pt needs Bil LEs blocked and use of pad under hips to A with coming to stand and pivoting to recliner.  pt continues to remain in flexed position and difficult to facilitate trunk/hip extension.     Ambulation/Gait                 Stairs            Wheelchair Mobility    Modified Rankin (Stroke Patients Only)       Balance Overall balance assessment: Needs assistance Sitting-balance support: Single extremity supported;Feet supported Sitting balance-Leahy Scale: Poor     Standing balance support: During functional activity Standing balance-Leahy Scale: Zero                      Cognition Arousal/Alertness: Awake/alert Behavior During Therapy: Flat affect Overall Cognitive Status: Difficult to assess                      Exercises      General Comments        Pertinent Vitals/Pain Pain Assessment: Faces Faces Pain Scale: Hurts little more Pain Location: pt with occasional grimaces.  Wife indicates hx of back pain.   Pain Intervention(s): Repositioned;Monitored during session    Home Living                      Prior Function            PT Goals (current goals can now be found in the care plan section) Acute Rehab PT Goals Patient Stated Goal: to get back to baseline PT Goal Formulation: With patient/family Time For Goal Achievement: 07/15/14 Potential to Achieve Goals: Good Progress towards PT goals: Progressing toward goals    Frequency  Min 3X/week  PT Plan Current plan remains appropriate    Co-evaluation             End of Session Equipment Utilized During Treatment: Gait belt;Oxygen (Ventimask) Activity Tolerance: Patient limited by fatigue Patient left: in chair;with call bell/phone within reach;with family/visitor present     Time: [id number] PT Time Calculation (min) (ACUTE ONLY): 22 min  Charges:  $Therapeutic Activity: 8-22 mins                    G CodesSunny Schlein:      Korra Christine F, [place]PT [phone] 07/06/2014, 3:06 PM

## 2014-07-07 LAB — GLUCOSE, CAPILLARY
GLUCOSE-CAPILLARY: 151 mg/dL — AB (ref 70–99)
GLUCOSE-CAPILLARY: 168 mg/dL — AB (ref 70–99)
Glucose-Capillary: 198 mg/dL — ABNORMAL HIGH (ref 70–99)
Glucose-Capillary: 181 mg/dL — ABNORMAL HIGH (ref 70–99)
Glucose-Capillary: 200 mg/dL — ABNORMAL HIGH (ref 70–99)

## 2014-07-07 MED ORDER — METHYLPREDNISOLONE SODIUM SUCC 40 MG IJ SOLR
40.0000 mg | Freq: Every day | INTRAMUSCULAR | Status: DC
  Administered 2014-07-07 – 2014-07-08 (×2): 40 mg via INTRAVENOUS
  Filled 2014-07-07 (×2): qty 1

## 2014-07-07 MED ORDER — INSULIN GLARGINE 100 UNIT/ML ~~LOC~~ SOLN
5.0000 [IU] | Freq: Every day | SUBCUTANEOUS | Status: DC
  Administered 2014-07-07 – 2014-07-08 (×2): 5 [IU] via SUBCUTANEOUS
  Filled 2014-07-07 (×3): qty 0.05

## 2014-07-07 NOTE — Progress Notes (Signed)
NUTRITION FOLLOW-UP  DOCUMENTATION CODES Per approved criteria  -Obesity Unspecified   INTERVENTION:  Continue Vital AF 1.2@ 60 ml/hr to provide 1728 kcal, 108 grams protein, and 1167 ml free water daily.  NUTRITION DIAGNOSIS: Inadequate oral intake related to inability to eat as evidenced by NPO status; ongoing.   Goal: Pt to meet >/= 90% of their estimated nutrition needs, met.  Monitor:  TF tolerance/adequacy, weight trend, labs, swallowing function.  ASSESSMENT: 80 y/o man with vascular disease, s/p AVR July of '15 (on plavix) who presented two days after a fall in which he hit his head but without LOC. The next morning he had some slurring of speech and word finding difficulty and was brought to the ED where he was found to have a subdural hematoma. Follow-up CT was negative for extension of the hematoma, but his exam declined, so he was taken to the OR for decompression with clot evacuation  Patient is receiving TF via NGT: Vital AF 1.2 at 60 ml/h with free water flushes 100 ml TID for total intake of 1728 kcals, 108 gm protein, 1468 ml free water daily.  Tolerating well per discussion with patient's daughter and RN; minimal residuals.  Height: Ht Readings from Last 1 Encounters:  06/26/14 $RemoveB'5\' 6"'fcalJTWd$  (1.676 m)    Weight: Wt Readings from Last 1 Encounters:  07/07/14 195 lb 12.3 oz (88.8 kg)  Admission weight: 205 lb (92.9 kg) 12/11  BMI:  Body mass index is 31.61 kg/(m^2).  Estimated Nutritional Needs: Kcal: 1600-1800 Protein: 90-110 grams Fluid: >/= 1800 ml daily  Skin: surgical incision on head  Diet Order: Diet NPO time specified   Intake/Output Summary (Last 24 hours) at 07/07/14 1438 Last data filed at 07/07/14 1300  Gross per 24 hour  Intake    946 ml  Output   1550 ml  Net   -604 ml    Last BM: 12/20   Labs:   Recent Labs Lab 07/02/14 0500 07/04/14 0355 07/05/14 0325  NA 143 143 143  K 3.6* 3.8 4.0  CL 102 105 102  CO2 $Re'30 30 29  'LTG$ BUN 28*  32* 43*  CREATININE 0.53 0.60 0.61  CALCIUM 9.3 9.1 9.5  GLUCOSE 134* 150* 193*    CBG (last 3)   Recent Labs  07/07/14 0428 07/07/14 0757 07/07/14 1329  GLUCAP 198* 181* 168*    Scheduled Meds: . albuterol  2.5 mg Nebulization Q6H  . antiseptic oral rinse  7 mL Mouth Rinse q12n4p  . cefTRIAXone (ROCEPHIN)  IV  1 g Intravenous Q24H  . chlorhexidine  15 mL Mouth Rinse BID  . free water  100 mL Per Tube 3 times per day  . insulin aspart  0-9 Units Subcutaneous 6 times per day  . insulin glargine  5 Units Subcutaneous Daily  . levETIRAcetam  250 mg Intravenous Q12H  . methylPREDNISolone (SOLU-MEDROL) injection  40 mg Intravenous Daily    Continuous Infusions: . sodium chloride 10 mL/hr at 07/06/14 1900  . feeding supplement (VITAL AF 1.2 CAL) 1,000 mL (07/07/14 0800)    Molli Barrows, RD, LDN, [place] Pager [phone] After Hours Pager [phone]

## 2014-07-07 NOTE — Progress Notes (Signed)
PULMONARY / CRITICAL CARE MEDICINE HISTORY AND PHYSICAL EXAMINATION   Name: Roy Mitchell MRN: [id number] DOB: [birth year]    ADMISSION DATE:  06/26/2014  PRIMARY SERVICE: PCCM  CHIEF COMPLAINT:  Aphasia after recent fall.  BRIEF PATIENT DESCRIPTION:  [age] M with AVR July 2015 (on plavix) who presented two days after a fall in which he hit his head and found to have a subdural hematoma. Underwent evacuation 12/11. Remained intubated post op. PCCM primary service.   SIGNIFICANT EVENTS / STUDIES:  12/11 CT head: Left-sided subdural hematoma as discussed above. Compression of the sulci but no midline shift or herniation 12/11 CT head: Left parietal subdural hematoma is unchanged in size. Small interhemispheric subdural hematoma also unchanged. No shift of the midline structures 12/11 craniotomy and evacuation of SDH (Elsner). Remained intubated post op 12/12 CT head: Postoperative changes from interval craniotomy and subdural hematoma evacuation as above 12/13 Extubated. Increasing O2 reqts over course of day 12/14 CXR with total collapse of R lung. Comfortable on NRB mask. Chest percussion and NTS ordered 12/15 CXR unchanged. Bedside FOB performed. Moderate amount of very thick mucoid secretions removed from R mainstem bronchus 12/16 CXR reveals total re-expansion of R lung. Probable vascular congestion with small R effusion. Remains on high flow O2. Furosemide X 1. Empiric antibiotics initiated. Failed SLP swallow assessment for 3rd day in row. NGT placed for TFs 12/17 Much improved neurologically. Speaking and F/C briskly. No resp distress. CIR consult requested 12/18 more lethargic, febrile and increasing O2 reqts. Made DNR after extended discussion with pt, wife, daughter and daughter in law    LINES / TUBES: ETT 12/11 >> 12/13 R IJ CVL 12/11 >> 12/17  CULTURES: Resp 12/15 >> NOF  ANTIBIOTICS: Ceftriaxone 12/16 >>   SUBJECTIVE:oob to chair Afebrile Remains on venti mask NAD  follows commands  VITAL SIGNS: Temp:  [97.8 F (36.6 C)-98.5 F (36.9 C)] 98 F (36.7 C) (12/22 0422) Pulse Rate:  [60-72] 72 (12/22 0500) Resp:  [12-21] 21 (12/22 0500) BP: (125-181)/(61-91) 160/74 mmHg (12/22 0500) SpO2:  [88 %-96 %] 93 % (12/22 0500) FiO2 (%):  [55 %] 55 % (12/22 0422) Weight:  [195 lb 12.3 oz (88.8 kg)] 195 lb 12.3 oz (88.8 kg) (12/22 0422)    VENTILATOR SETTINGS: Vent Mode:  [-]  FiO2 (%):  [55 %] 55 % INTAKE / OUTPUT: Intake/Output      12/21 0701 - 12/22 0700 12/22 0701 - 12/23 0700   I.V. (mL/kg) 237.5 (2.7)    NG/GT 536    IV Piggyback 102.5    Total Intake(mL/kg) 876 (9.9)    Urine (mL/kg/hr) 1100 (0.5)    Total Output 1100     Net -224            PHYSICAL EXAMINATION: General:  NAD  Neuro:  RASS 0. + F/C , aphasic HEENT: post op changes Neck: No JVD noted Cardiovascular:  Reg, no M Lungs: coarse rhonchi , decreased air flow Abdomen:  Soft, +BS, TF Ext: warm, no edema   Recent Labs Lab 07/02/14 0500 07/04/14 0355 07/05/14 0325  NA 143 143 143  K 3.6* 3.8 4.0  CL 102 105 102  CO2 30 30 29   BUN 28* 32* 43*  CREATININE 0.53 0.60 0.61  GLUCOSE 134* 150* 193*    Recent Labs Lab 07/01/14 0510 07/05/14 0325  HGB 10.6* 11.9*  HCT 33.6* 36.8*  WBC 8.4 9.7  PLT 175 254    Dg Abd Portable 1v  07/06/2014  CLINICAL DATA:  Nasogastric tube placement  EXAM: PORTABLE ABDOMEN - 1 VIEW  COMPARISON:  Same date, 5:41 a.m.  FINDINGS: Interval placement of nasogastric tube with tip over the mid stomach. No change otherwise.  IMPRESSION: Nasogastric tube tip over the gastric body.   Electronically Signed   By: Christiana PellantGretchen  Green M.D.   On: 07/06/2014 19:58   Dg Abd Portable 1v  07/06/2014   CLINICAL DATA:  Feeding tube dysfunction.  EXAM: PORTABLE ABDOMEN - 1 VIEW  COMPARISON:  07/03/2014.  FINDINGS: No feeding tube is identified. Gas pattern is nonspecific. Stools in the colon. No free air identified. Cardiac valve replacement. Cardiomegaly  with mild bibasilar atelectasis.  IMPRESSION: 1. No feeding tube visualized. 2. Nonspecific abdomen.  No bowel distention.   Electronically Signed   By: Maisie Fushomas  Register   On: 07/06/2014 07:24     ASSESSMENT / PLAN: Principal Problem:   SDH (subdural hematoma) Active Problems:   Abnormality of gait   HTN (hypertension)   Anxiety and depression   Acute respiratory failure with hypoxia   Acute respiratory failure   Lung collapse   DNR (do not resuscitate)   PULMONARY A: Acute Respiratory failure , hypoxic -after craniotomy R lung collapse due to mucus plug, resolved Small R pleural effusion, resolved Suspect mild pulmonary edema, resolved Poor cough mechanics Remote smoker -quit 40y, doubt sig COPD P:   Cont supp O2 to maintain SpO2 > 92% Cont PRN NTS  Cont nebulized albuterol Cont  steroids at 40 daily  CARDIOVASCULAR A: Recent AVR P:   Holding clopidogrel until resumption is OK'd by NS  Labetalol prn for SBP < 160  RENAL A: No acute issues P:   Monitor BMET intermittently Monitor I/Os Correct electrolytes as indicated  GASTROINTESTINAL A: Dysphagia due to SDH P:   Cont NGT/TFs  SLP eval ongoing  HEMATOLOGIC A: Mild anemia without acute blood loss P:   DVT px: SCDs Monitor CBC intermittently Transfuse per usual guidelines  INFECTIOUS A: Fever Mucopurulent bronchitis P:   Dc ceftx 12/23  ENDOCRINE CBG (last 3)   Recent Labs  07/06/14 1925 07/06/14 2348 07/07/14 0428  GLUCAP 144* 141* 198*     A: Mild hyperglycemia without hx of DM P:   Cont SSI with TFs 12/22 lantus 5 units daily added till steroids off  NEUROLOGIC A: Traumatic SDH,  s/p evacuation 12/11 Mild essential tremor P:   Post op mgmt per NS   Brett CanalesSteve Minor ACNP [facility] Pager [phone] till 3 pm If no answer page [phone]  Family updated @ bedside. Agreeable to LTAC, not a candidate for CIR - DNR. He is making slow progress, hypoxia & dysphagia remain ongoing  issues  Care during the described time interval was provided by me and/or other providers on the critical care team.  I have reviewed this patient's available data, including medical history, events of note, physical examination and test results as part of my evaluation  Oretha MilchALVA,Adolpho Meenach V. MD  07/07/2014, 8:36 AM

## 2014-07-07 NOTE — Progress Notes (Signed)
Speech Language Pathology Treatment: Dysphagia  Patient Details Name: Roy Mitchell P Lukasiewicz MRN: [id number] DOB: Sep 01, 1923 Today's Date: 07/07/2014 Time: 1450-1506 SLP Time Calculation (min) (ACUTE ONLY): 16 min  Assessment / Plan / Recommendation Clinical Impression  Pt alert but fatigued after transfer from chair to bed. SLP provided Total A for oral care via suctioning. Pt consumed clinician administered ice chips, opening his mouth independently to accept PO, however requiring Max cues from SLP for labial closure. Pt initiated pharyngeal swallow with first ice chip, with increasingly prolonged time from oral acceptance to pharyngeal swallow with each subsequent trial despite multimodal cueing from SLP. Pt with immediate coughing noted prior to initiation of swallow responses, worrisome for aspiration. Recommend to remain NPO at this time. Will continue to follow.   HPI HPI: 80 year old man with history of HTN, HLD, prediabetes, GERD, multinodular goiter, degenerative disc disease, bovine aortic valve replacement admitted with aphasia. Wednesday afternoon he had a mechanical fall while walking on the sidewalk. He hit the left side of his head. Denies LOC. CT Left-sided subdural hematoma.  Underwent left parietal craniotomy 06/26/14.  Intubated 12/11-12/14/15.   Pertinent Vitals Pain Assessment: Faces Faces Pain Scale: No hurt  SLP Plan  Continue with current plan of care    Recommendations Diet recommendations: NPO Medication Administration: Via alternative means              Oral Care Recommendations: Oral care Q4 per protocol Follow up Recommendations: LTACH Plan: Continue with current plan of care    GO      Maxcine HamLaura Paiewonsky, M.A. CCC-SLP [phone]  Maxcine Hamaiewonsky, Alayia Meggison 07/07/2014, 3:08 PM

## 2014-07-07 NOTE — Progress Notes (Signed)
Patient ID: Roy Mitchell, male   DOB: 1923/09/06, 80 y.o.   MRN: [id number] Vital signs are stable Alertness is fairly good Aphasia  remains a major problem Upper airway congestion is also problematic CT scan shows stable subdural hematoma residual which will likely resolve on its own I will be out of town over the Christmas holiday however if neurosurgical intervention is required please consult our service.

## 2014-07-07 NOTE — Progress Notes (Signed)
Removed staples from scalp incision. Pt tolerated well, and no drainage was noted. Incision stable and open to air.

## 2014-07-07 NOTE — Progress Notes (Addendum)
Pt has a bed offer from both LTACHs- family has chosen to accept bed offer [facility]- [facility] bed [facility] available today- contacted MD to notify of bed availability [facility] today. Await  d/c summary and order to d/c to [facility]. Cobra form on shadow chart for MD signature

## 2014-07-08 ENCOUNTER — Inpatient Hospital Stay (HOSPITAL_COMMUNITY): Payer: Medicare Other

## 2014-07-08 ENCOUNTER — Inpatient Hospital Stay
Admission: AD | Admit: 2014-07-08 | Discharge: 2014-07-28 | Disposition: A | Discharge status: Rehabilitation Facility | Payer: Self-pay | Source: Ambulatory Visit | Attending: Internal Medicine | Admitting: Internal Medicine

## 2014-07-08 DIAGNOSIS — J45909 Unspecified asthma, uncomplicated: Secondary | ICD-10-CM | POA: Diagnosis present

## 2014-07-08 DIAGNOSIS — S065X4A Traumatic subdural hemorrhage with loss of consciousness of 6 hours to 24 hours, initial encounter: Secondary | ICD-10-CM | POA: Diagnosis present

## 2014-07-08 DIAGNOSIS — I10 Essential (primary) hypertension: Secondary | ICD-10-CM | POA: Diagnosis present

## 2014-07-08 DIAGNOSIS — R269 Unspecified abnormalities of gait and mobility: Secondary | ICD-10-CM

## 2014-07-08 DIAGNOSIS — F32A Depression, unspecified: Secondary | ICD-10-CM | POA: Diagnosis present

## 2014-07-08 DIAGNOSIS — F419 Anxiety disorder, unspecified: Secondary | ICD-10-CM

## 2014-07-08 DIAGNOSIS — F329 Major depressive disorder, single episode, unspecified: Secondary | ICD-10-CM | POA: Diagnosis present

## 2014-07-08 LAB — CBC
HCT: 37.1 % — ABNORMAL LOW (ref 39.0–52.0)
Hemoglobin: 11.5 g/dL — ABNORMAL LOW (ref 13.0–17.0)
MCH: 26.3 pg (ref 26.0–34.0)
MCHC: 31 g/dL (ref 30.0–36.0)
MCV: 84.9 fL (ref 78.0–100.0)
PLATELETS: 275 10*3/uL (ref 150–400)
RBC: 4.37 MIL/uL (ref 4.22–5.81)
RDW: 16.7 % — ABNORMAL HIGH (ref 11.5–15.5)
WBC: 11.1 10*3/uL — ABNORMAL HIGH (ref 4.0–10.5)

## 2014-07-08 LAB — BASIC METABOLIC PANEL
ANION GAP: 4 — AB (ref 5–15)
BUN: 37 mg/dL — ABNORMAL HIGH (ref 6–23)
CALCIUM: 8.8 mg/dL (ref 8.4–10.5)
CO2: 32 mmol/L (ref 19–32)
Chloride: 108 mEq/L (ref 96–112)
Creatinine, Ser: 0.56 mg/dL (ref 0.50–1.35)
GFR calc Af Amer: >90 mL/min (ref >90)
GFR calc non Af Amer: 90 mL/min — ABNORMAL LOW (ref >90)
Glucose, Bld: 147 mg/dL — ABNORMAL HIGH (ref 70–99)
Potassium: 3.5 mmol/L (ref 3.5–5.1)
SODIUM: 144 mmol/L (ref 135–145)

## 2014-07-08 LAB — MAGNESIUM: Magnesium: 2.2 mg/dL (ref 1.5–2.5)

## 2014-07-08 LAB — GLUCOSE, CAPILLARY
GLUCOSE-CAPILLARY: 149 mg/dL — AB (ref 70–99)
GLUCOSE-CAPILLARY: 136 mg/dL — AB (ref 70–99)
Glucose-Capillary: 129 mg/dL — ABNORMAL HIGH (ref 70–99)
Glucose-Capillary: 148 mg/dL — ABNORMAL HIGH (ref 70–99)

## 2014-07-08 LAB — PHOSPHORUS: PHOSPHORUS: 2.2 mg/dL — AB (ref 2.3–4.6)

## 2014-07-08 MED ORDER — METHYLPREDNISOLONE SODIUM SUCC 40 MG IJ SOLR: 40.0000 mg | Freq: Every day | INTRAMUSCULAR | Status: DC

## 2014-07-08 MED ORDER — CEFTRIAXONE SODIUM IN DEXTROSE 20 MG/ML IV SOLN: 1.0000 g | INTRAVENOUS | Status: DC

## 2014-07-08 MED ORDER — FREE WATER: 100.0000 mL | Freq: Three times a day (TID) | Status: DC

## 2014-07-08 MED ORDER — LEVETIRACETAM 500 MG/5ML IV SOLN: 250.0000 mg | Freq: Two times a day (BID) | INTRAVENOUS | Status: DC

## 2014-07-08 MED ORDER — INSULIN ASPART 100 UNIT/ML ~~LOC~~ SOLN: 0.0000 [IU] | SUBCUTANEOUS | Status: DC

## 2014-07-08 MED ORDER — INSULIN GLARGINE 100 UNIT/ML ~~LOC~~ SOLN: 5.0000 [IU] | Freq: Every day | SUBCUTANEOUS | Status: DC

## 2014-07-08 MED ORDER — VITAL AF 1.2 CAL PO LIQD: 1000.0000 mL | ORAL | Status: DC

## 2014-07-08 NOTE — Care Management Note (Signed)
    Page 1 of 2   07/08/2014     12:12:05 PM CARE MANAGEMENT NOTE 07/08/2014  Patient:  Roy Mitchell,Roy Mitchell   Account Number:  [id number]  Date Initiated:  06/29/2014  Documentation initiated by:  Carlyle LipaBRYSON,MICHELLE  Subjective/Objective Assessment:   fall w/ SDH requiring crani to evacuate; vomiting after extubation--NGT to sx; now on hospital day 3, on 55% NRB     Action/Plan:   await medical stability and PT/OT evals to determine pt's deficits and next best level of care   Anticipated DC Date:  07/08/2014   Anticipated DC Plan:  LONG TERM ACUTE CARE (LTAC)      DC Planning Services  CM consult      Choice offered to / List presented to:             Status of service:  Completed, signed off Medicare Important Message given?  YES (If response is "NO", the following Medicare IM given date fields will be blank) Date Medicare IM given:  07/02/2014 Medicare IM given by:  Carlyle LipaBRYSON,MICHELLE Date Additional Medicare IM given:  07/06/2014 Additional Medicare IM given by:  Isidoro DonningALESIA SHAVIS  Discharge Disposition:  LONG TERM ACUTE CARE (LTAC)  Per UR Regulation:  Reviewed for med. necessity/level of care/duration of stay  If discussed at Long Length of Stay Meetings, dates discussed:   07/02/2014    Comments:  07/08/14- 0900- Donn PieriniKristi Andrae Claunch RN, BSN [phone] [facility] today- bed [facility]- awaiting d/c order and summary.  07/07/14- 1100- Donn PieriniKristi Juel Ripley RN, BSN [phone] Referral for LTACH received- [facility]- both will look at pt and let CM know if appropriate and any bed offers to be made- will await to hear from both- went to room to speak with family- wife at bedside- discussed LTACH option- wife confirmed MD conversation - let her know that CM had reached out to both LTACHs in town- daughter will be back after lunch- will plan to return to further discuss LTACH options. 1400- update- spoke with daughter and wife at bedside- explained  LTACH options and that pt had bed offers from both [facility]- per daughter and wife- their first choice would be to keep pt in house and would prefer to take the offer from [facility]- notified both [facility] and [facility] of family choice- also call placed to MD- spoke with both Dr. Sung AmabileSimonds and Dr. Vassie LollAlva- notified them of bed availability today for [facility]- per conversation will plan to tx pt in am to [facility]. Family aware of plan. Cobra form signed by family and placed on shadow chart for MD to sign in am.   07/02/2014 Carlyle LipaMichelle Bryson, RN BSN [facility] CCM 1521--Pt remains in ICU but has stepdown orders today. Still at O2 3L, weaned down from 14L earlier today.  Plan is for CIR at d/c. Consult already ordered.

## 2014-07-08 NOTE — Discharge Summary (Signed)
Physician Discharge Summary  Patient ID: Roy Mitchell MRN: [id number] DOB/AGE: Aug 31, 1923 80 y.o.  Admit date: 06/26/2014 Discharge date: 07/08/2014  Problem List Principal Problem:   SDH (subdural hematoma) Active Problems:   Abnormality of gait   HTN (hypertension)   Anxiety and depression   Acute respiratory failure with hypoxia   Acute respiratory failure   Lung collapse   DNR (do not resuscitate)  HPI: Roy Mitchell is an 80 year old man with history of HTN, HLD, prediabetes, bovine aortic valve replacement 02/11/2014 on Plavix presenting with aphasia. Wednesday afternoon he had a mechanical fall while walking on the sidewalk. He hit the left side of his head. Denies LOC. He was last noted to be functioning at baseline last night prior to going to bed. When he woke up this morning his family noted aphasia with both slurred speech and impaired expression (wrong words). Family also notes that he had trouble gripping with his R hand. No trouble noted with walking with walker. He reports dizziness, lightheadedness after his fall. Denies fevers, chills, vision changes, new cough, shortness of breath, trouble swallowing, chest pain, palpitations, nausea, vomiting, abdominal pain, diarrhea, constipation, hematochezia, melena, dysuria, hematuria, numbness. Hospital Course:   SIGNIFICANT EVENTS / STUDIES:  12/11 CT head: Left-sided subdural hematoma as discussed above. Compression of the sulci but no midline shift or herniation 12/11 CT head: Left parietal subdural hematoma is unchanged in size. Small interhemispheric subdural hematoma also unchanged. No shift of the midline structures 12/11 craniotomy and evacuation of SDH (Elsner). Remained intubated post op 12/12 CT head: Postoperative changes from interval craniotomy and subdural hematoma evacuation as above 12/13 Extubated. Increasing O2 reqts over course of day 12/14 CXR with total collapse of R lung. Comfortable on NRB mask.  Chest percussion and NTS ordered 12/15 CXR unchanged. Bedside FOB performed. Moderate amount of very thick mucoid secretions removed from R mainstem bronchus 12/16 CXR reveals total re-expansion of R lung. Probable vascular congestion with small R effusion. Remains on high flow O2. Furosemide X 1. Empiric antibiotics initiated. Failed SLP swallow assessment for 3rd day in row. NGT placed for TFs 12/17 Much improved neurologically. Speaking and F/C briskly. No resp distress. CIR consult requested 12/18 more lethargic, febrile and increasing O2 reqts. Made DNR after extended discussion with pt, wife, daughter and daughter in law   LINES / TUBES: ETT 12/11 >> 12/13 R IJ CVL 12/11 >>   CULTURES: Resp 12/15 >> NOF  ANTIBIOTICS: Ceftriaxone 12/16 >> planned stopped 12/25  PULMONARY A: Acute Respiratory failure , hypoxic -after craniotomy R lung collapse due to mucus plug, resolved Small R pleural effusion, resolved Suspect mild pulmonary edema, resolved Poor cough mechanics Remote smoker -quit 40y, doubt sig COPD P:  Cont supp O2 to maintain SpO2 > 92% Cont PRN NTS  Cont nebulized albuterol Cont steroids at 40 daily, begin taper 12/25  CARDIOVASCULAR A: Recent AVR P:  Holding clopidogrel until resumption is OK'd by NS  Labetalol prn for SBP < 160  RENAL Lab Results  Component Value Date   CREATININE 0.56 07/08/2014   CREATININE 0.61 07/05/2014   CREATININE 0.60 07/04/2014    Recent Labs Lab 07/04/14 0355 07/05/14 0325 07/08/14 0425  K 3.8 4.0 3.5     Recent Labs Lab 07/04/14 0355 07/05/14 0325 07/08/14 0425  NA 143 143 144    A: No acute issues P:  Monitor BMET intermittently Monitor I/Os Correct electrolytes as indicated  GASTROINTESTINAL A: Dysphagia due to SDH P:  Cont  NGT/TFs  SLP eval ongoing  HEMATOLOGIC A: Mild anemia without acute blood loss P:  DVT px: SCDs Monitor CBC intermittently Transfuse per usual  guidelines  INFECTIOUS A: Fever Mucopurulent bronchitis P:  Dc ceftx 12/25 planned  ENDOCRINE CBG (last 3)   Recent Labs (last 2 labs)      Recent Labs  07/06/14 1925 07/06/14 2348 07/07/14 0428  GLUCAP 144* 141* 198*       A: Mild hyperglycemia without hx of DM P:  Cont SSI with TFs 12/22 lantus 5 units daily added till steroids off  NEUROLOGIC A: Traumatic SDH, s/p evacuation 12/11 Mild essential tremor P:  Post op mgmt per NS  Family updated @ bedside. Agreeable to LTAC, not a candidate for CIR - DNR. He is making slow progress, hypoxia & dysphagia remain ongoing issues  Labs at discharge Lab Results  Component Value Date   CREATININE 0.56 07/08/2014   BUN 37* 07/08/2014   NA 144 07/08/2014   K 3.5 07/08/2014   CL 108 07/08/2014   CO2 32 07/08/2014   Lab Results  Component Value Date   WBC 11.1* 07/08/2014   HGB 11.5* 07/08/2014   HCT 37.1* 07/08/2014   MCV 84.9 07/08/2014   PLT 275 07/08/2014   Lab Results  Component Value Date   ALT 13 06/26/2014   AST 14 06/26/2014   ALKPHOS 70 06/26/2014   BILITOT 0.8 06/26/2014   Lab Results  Component Value Date   INR 1.02 06/26/2014    Current radiology studies Dg Chest Port 1 View  07/08/2014   CLINICAL DATA:  Respiratory failure  EXAM: PORTABLE CHEST - 1 VIEW  COMPARISON:  07/05/2014  FINDINGS: Cardiomediastinal silhouette is stable. Stable NG tube position. Stable right IJ central line position. Atherosclerotic calcifications of thoracic aorta again noted. Again noted left basilar atelectasis or infiltrate. Right basilar atelectasis. No pulmonary edema.  IMPRESSION: Again noted left basilar atelectasis or infiltrate. Right basilar atelectasis. No pulmonary edema. Stable NG tube position and right IJ central line position.   Electronically Signed   By: Natasha MeadLiviu  Pop M.D.   On: 07/08/2014 07:59   Dg Abd Portable 1v  07/06/2014   CLINICAL DATA:  Nasogastric tube placement  EXAM:  PORTABLE ABDOMEN - 1 VIEW  COMPARISON:  Same date, 5:41 a.m.  FINDINGS: Interval placement of nasogastric tube with tip over the mid stomach. No change otherwise.  IMPRESSION: Nasogastric tube tip over the gastric body.   Electronically Signed   By: Christiana PellantGretchen  Green M.D.   On: 07/06/2014 19:58    Disposition:  [facility]     Medication List    STOP taking these medications        CALCIUM-VITAMIN D PO     CENTRUM SILVER PO     clopidogrel 75 MG tablet  Commonly known as:  PLAVIX     dextromethorphan-guaiFENesin 30-600 MG per 12 hr tablet  Commonly known as:  MUCINEX DM     escitalopram 5 MG tablet  Commonly known as:  LEXAPRO     Fluticasone-Salmeterol 250-50 MCG/DOSE Aepb  Commonly known as:  ADVAIR     ibuprofen 200 MG tablet  Commonly known as:  ADVIL,MOTRIN     LIPO-FLAVONOID PLUS PO     LORazepam 2 MG tablet  Commonly known as:  ATIVAN     MIRALAX PO     pantoprazole 20 MG tablet  Commonly known as:  PROTONIX     simvastatin 40 MG tablet  Commonly known  as:  ZOCOR      TAKE these medications        albuterol (2.5 MG/3ML) 0.083% nebulizer solution  Commonly known as:  PROVENTIL  Take 2.5 mg by nebulization every 6 (six) hours as needed for wheezing or shortness of breath.     cefTRIAXone 20 MG/ML IVPB  Commonly known as:  ROCEPHIN  Inject 50 mLs (1 g total) into the vein daily.     feeding supplement (VITAL AF 1.2 CAL) Liqd  Place 1,000 mLs into feeding tube continuous.     finasteride 5 MG tablet  Commonly known as:  PROSCAR  Take 5 mg by mouth daily.     free water Soln  Place 100 mLs into feeding tube every 8 (eight) hours.     insulin aspart 100 UNIT/ML injection  Commonly known as:  novoLOG  Inject 0-9 Units into the skin every 4 (four) hours.     insulin glargine 100 UNIT/ML injection  Commonly known as:  LANTUS  Inject 0.05 mLs (5 Units total) into the skin daily.     levETIRAcetam 250 mg in sodium chloride 0.9 % 100 mL   Inject 250 mg into the vein every 12 (twelve) hours.     methylPREDNISolone sodium succinate 40 mg/mL injection  Commonly known as:  SOLU-MEDROL  Inject 1 mL (40 mg total) into the vein daily.     tamsulosin 0.4 MG Caps capsule  Commonly known as:  FLOMAX  Take 0.4 mg by mouth at bedtime.          Discharged Condition: fair  Time spent on discharge greater than 40 minutes.  Vital signs at Discharge. Temp:  [97.4 F (36.3 C)-98.9 F (37.2 C)] 98.5 F (36.9 C) (12/23 0749) Pulse Rate:  [64-84] 74 (12/23 0707) Resp:  [14-25] 17 (12/23 0707) BP: (147-177)/(56-81) 155/81 mmHg (12/23 0707) SpO2:  [92 %-97 %] 95 % (12/23 0749) FiO2 (%):  [55 %] 55 % (12/22 1411) Weight:  [185 lb 13.6 oz (84.3 kg)] 185 lb 13.6 oz (84.3 kg) (12/23 0411) Office follow up Special Information or instructions. [facility] Signed: Brett CanalesSteve Minor ACNP [facility] Pager [phone] till 3 pm If no answer page [phone] 07/08/2014, 11:46 AM

## 2014-07-08 NOTE — Progress Notes (Signed)
[facility] and family to [facility] via bed. Discharged summary and cobra form done by md.

## 2014-07-09 LAB — TSH: TSH: 0.05 u[IU]/mL — ABNORMAL LOW (ref 0.350–4.500)

## 2014-07-09 LAB — COMPREHENSIVE METABOLIC PANEL
ALT: 20 U/L (ref 0–53)
AST: 16 U/L (ref 0–37)
Albumin: 2.7 g/dL — ABNORMAL LOW (ref 3.5–5.2)
Alkaline Phosphatase: 89 U/L (ref 39–117)
Anion gap: 5 (ref 5–15)
BUN: 30 mg/dL — ABNORMAL HIGH (ref 6–23)
CALCIUM: 8.8 mg/dL (ref 8.4–10.5)
CO2: 31 mmol/L (ref 19–32)
Chloride: 105 mEq/L (ref 96–112)
Creatinine, Ser: 0.5 mg/dL (ref 0.50–1.35)
GFR calc non Af Amer: >90 mL/min (ref >90)
GLUCOSE: 135 mg/dL — AB (ref 70–99)
Potassium: 3.6 mmol/L (ref 3.5–5.1)
SODIUM: 141 mmol/L (ref 135–145)
Total Bilirubin: 0.6 mg/dL (ref 0.3–1.2)
Total Protein: 6.1 g/dL (ref 6.0–8.3)

## 2014-07-09 LAB — CBC
HCT: 38.4 % — ABNORMAL LOW (ref 39.0–52.0)
HEMOGLOBIN: 12.1 g/dL — AB (ref 13.0–17.0)
MCH: 26.3 pg (ref 26.0–34.0)
MCHC: 31.5 g/dL (ref 30.0–36.0)
MCV: 83.5 fL (ref 78.0–100.0)
Platelets: 289 10*3/uL (ref 150–400)
RBC: 4.6 MIL/uL (ref 4.22–5.81)
RDW: 16.4 % — ABNORMAL HIGH (ref 11.5–15.5)
WBC: 13.6 10*3/uL — AB (ref 4.0–10.5)

## 2014-07-14 ENCOUNTER — Other Ambulatory Visit (HOSPITAL_COMMUNITY): Payer: Medicare Other

## 2014-07-16 LAB — URINALYSIS, ROUTINE W REFLEX MICROSCOPIC
Bilirubin Urine: NEGATIVE
Glucose, UA: NEGATIVE mg/dL
Ketones, ur: NEGATIVE mg/dL
NITRITE: NEGATIVE
PH: 5.5 (ref 5.0–8.0)
Protein, ur: NEGATIVE mg/dL
Specific Gravity, Urine: 1.025 (ref 1.005–1.030)
Urobilinogen, UA: 1 mg/dL (ref 0.0–1.0)

## 2014-07-16 LAB — URINE MICROSCOPIC-ADD ON

## 2014-07-19 LAB — URINE CULTURE
CULTURE: NO GROWTH
Colony Count: NO GROWTH

## 2014-07-22 LAB — BASIC METABOLIC PANEL
Anion gap: 9 (ref 5–15)
BUN: 23 mg/dL (ref 6–23)
CHLORIDE: 105 meq/L (ref 96–112)
CO2: 26 mmol/L (ref 19–32)
CREATININE: 0.64 mg/dL (ref 0.50–1.35)
Calcium: 8.6 mg/dL (ref 8.4–10.5)
GFR, EST NON AFRICAN AMERICAN: 85 mL/min — AB (ref >90)
GLUCOSE: 101 mg/dL — AB (ref 70–99)
Potassium: 3.6 mmol/L (ref 3.5–5.1)
Sodium: 140 mmol/L (ref 135–145)

## 2014-07-22 LAB — CBC
HEMATOCRIT: 39.6 % (ref 39.0–52.0)
Hemoglobin: 12.9 g/dL — ABNORMAL LOW (ref 13.0–17.0)
MCH: 27.5 pg (ref 26.0–34.0)
MCHC: 32.6 g/dL (ref 30.0–36.0)
MCV: 84.4 fL (ref 78.0–100.0)
Platelets: 104 10*3/uL — ABNORMAL LOW (ref 150–400)
RBC: 4.69 MIL/uL (ref 4.22–5.81)
RDW: 17.2 % — ABNORMAL HIGH (ref 11.5–15.5)
WBC: 7.5 10*3/uL (ref 4.0–10.5)

## 2014-07-23 LAB — CBC
HCT: 41.9 % (ref 39.0–52.0)
Hemoglobin: 13.7 g/dL (ref 13.0–17.0)
MCH: 27.2 pg (ref 26.0–34.0)
MCHC: 32.7 g/dL (ref 30.0–36.0)
MCV: 83.3 fL (ref 78.0–100.0)
PLATELETS: 98 10*3/uL — AB (ref 150–400)
RBC: 5.03 MIL/uL (ref 4.22–5.81)
RDW: 17.2 % — ABNORMAL HIGH (ref 11.5–15.5)
WBC: 9.4 10*3/uL (ref 4.0–10.5)

## 2014-07-27 LAB — CBC
HCT: 36.5 % — ABNORMAL LOW (ref 39.0–52.0)
HEMOGLOBIN: 11.7 g/dL — AB (ref 13.0–17.0)
MCH: 26.2 pg (ref 26.0–34.0)
MCHC: 32.1 g/dL (ref 30.0–36.0)
MCV: 81.7 fL (ref 78.0–100.0)
Platelets: 112 10*3/uL — ABNORMAL LOW (ref 150–400)
RBC: 4.47 MIL/uL (ref 4.22–5.81)
RDW: 16.6 % — ABNORMAL HIGH (ref 11.5–15.5)
WBC: 8.6 10*3/uL (ref 4.0–10.5)

## 2014-07-28 ENCOUNTER — Encounter (HOSPITAL_COMMUNITY): Payer: Self-pay

## 2014-07-28 ENCOUNTER — Inpatient Hospital Stay (HOSPITAL_COMMUNITY)
Admission: RE | Admit: 2014-07-28 | Discharge: 2014-08-14 | DRG: 082 | Disposition: A | Discharge status: Other Acute Inpatient Hospital | Payer: Medicare Other | Source: Other Acute Inpatient Hospital | Attending: Physical Medicine & Rehabilitation | Admitting: Physical Medicine & Rehabilitation

## 2014-07-28 ENCOUNTER — Inpatient Hospital Stay (HOSPITAL_COMMUNITY): Payer: Medicare Other

## 2014-07-28 DIAGNOSIS — Z66 Do not resuscitate: Secondary | ICD-10-CM | POA: Diagnosis not present

## 2014-07-28 DIAGNOSIS — S065X4A Traumatic subdural hemorrhage with loss of consciousness of 6 hours to 24 hours, initial encounter: Principal | ICD-10-CM | POA: Diagnosis present

## 2014-07-28 DIAGNOSIS — Z9981 Dependence on supplemental oxygen: Secondary | ICD-10-CM | POA: Diagnosis not present

## 2014-07-28 DIAGNOSIS — R6520 Severe sepsis without septic shock: Secondary | ICD-10-CM | POA: Diagnosis not present

## 2014-07-28 DIAGNOSIS — R1314 Dysphagia, pharyngoesophageal phase: Secondary | ICD-10-CM | POA: Diagnosis present

## 2014-07-28 DIAGNOSIS — F329 Major depressive disorder, single episode, unspecified: Secondary | ICD-10-CM | POA: Diagnosis present

## 2014-07-28 DIAGNOSIS — R059 Cough, unspecified: Secondary | ICD-10-CM | POA: Insufficient documentation

## 2014-07-28 DIAGNOSIS — R05 Cough: Secondary | ICD-10-CM | POA: Insufficient documentation

## 2014-07-28 DIAGNOSIS — J9601 Acute respiratory failure with hypoxia: Secondary | ICD-10-CM | POA: Diagnosis present

## 2014-07-28 DIAGNOSIS — D649 Anemia, unspecified: Secondary | ICD-10-CM | POA: Diagnosis not present

## 2014-07-28 DIAGNOSIS — F419 Anxiety disorder, unspecified: Secondary | ICD-10-CM | POA: Diagnosis present

## 2014-07-28 DIAGNOSIS — S065X4S Traumatic subdural hemorrhage with loss of consciousness of 6 hours to 24 hours, sequela: Secondary | ICD-10-CM

## 2014-07-28 DIAGNOSIS — R0602 Shortness of breath: Secondary | ICD-10-CM | POA: Insufficient documentation

## 2014-07-28 DIAGNOSIS — J690 Pneumonitis due to inhalation of food and vomit: Secondary | ICD-10-CM | POA: Diagnosis not present

## 2014-07-28 DIAGNOSIS — J45909 Unspecified asthma, uncomplicated: Secondary | ICD-10-CM | POA: Diagnosis present

## 2014-07-28 DIAGNOSIS — R062 Wheezing: Secondary | ICD-10-CM

## 2014-07-28 DIAGNOSIS — A419 Sepsis, unspecified organism: Secondary | ICD-10-CM | POA: Diagnosis not present

## 2014-07-28 DIAGNOSIS — N401 Enlarged prostate with lower urinary tract symptoms: Secondary | ICD-10-CM | POA: Diagnosis present

## 2014-07-28 DIAGNOSIS — E860 Dehydration: Secondary | ICD-10-CM | POA: Diagnosis not present

## 2014-07-28 DIAGNOSIS — R319 Hematuria, unspecified: Secondary | ICD-10-CM | POA: Diagnosis present

## 2014-07-28 DIAGNOSIS — I6201 Nontraumatic acute subdural hemorrhage: Secondary | ICD-10-CM | POA: Diagnosis present

## 2014-07-28 DIAGNOSIS — E876 Hypokalemia: Secondary | ICD-10-CM | POA: Diagnosis not present

## 2014-07-28 DIAGNOSIS — I10 Essential (primary) hypertension: Secondary | ICD-10-CM | POA: Diagnosis present

## 2014-07-28 DIAGNOSIS — J4530 Mild persistent asthma, uncomplicated: Secondary | ICD-10-CM

## 2014-07-28 LAB — GLUCOSE, CAPILLARY
GLUCOSE-CAPILLARY: 131 mg/dL — AB (ref 70–99)
Glucose-Capillary: 143 mg/dL — ABNORMAL HIGH (ref 70–99)

## 2014-07-28 MED ORDER — MODAFINIL 100 MG PO TABS
100.0000 mg | ORAL_TABLET | Freq: Every day | ORAL | Status: DC
  Administered 2014-07-29 – 2014-08-13 (×15): 100 mg via ORAL
  Filled 2014-07-28 (×15): qty 1

## 2014-07-28 MED ORDER — BUDESONIDE 0.5 MG/2ML IN SUSP
0.5000 mg | Freq: Two times a day (BID) | RESPIRATORY_TRACT | Status: DC
  Administered 2014-07-28 – 2014-08-14 (×32): 0.5 mg via RESPIRATORY_TRACT
  Filled 2014-07-28 (×37): qty 2

## 2014-07-28 MED ORDER — METOPROLOL TARTRATE 25 MG PO TABS
25.0000 mg | ORAL_TABLET | Freq: Two times a day (BID) | ORAL | Status: DC
  Administered 2014-07-28 – 2014-08-13 (×31): 25 mg via ORAL
  Filled 2014-07-28 (×37): qty 1

## 2014-07-28 MED ORDER — BISACODYL 10 MG RE SUPP: 10.0000 mg | Freq: Every day | RECTAL | Status: DC | PRN

## 2014-07-28 MED ORDER — LEVETIRACETAM 250 MG PO TABS
250.0000 mg | ORAL_TABLET | Freq: Two times a day (BID) | ORAL | Status: DC
  Administered 2014-07-28 – 2014-07-29 (×2): 250 mg via ORAL
  Filled 2014-07-28 (×6): qty 1

## 2014-07-28 MED ORDER — PREDNISONE 5 MG PO TABS
5.0000 mg | ORAL_TABLET | Freq: Every day | ORAL | Status: DC
  Administered 2014-08-03 – 2014-08-13 (×11): 5 mg via ORAL
  Filled 2014-07-28 (×13): qty 1

## 2014-07-28 MED ORDER — DIPHENHYDRAMINE HCL 12.5 MG/5ML PO ELIX
12.5000 mg | ORAL_SOLUTION | Freq: Four times a day (QID) | ORAL | Status: DC | PRN
Start: 2014-07-28 — End: 2014-08-14

## 2014-07-28 MED ORDER — ONDANSETRON HCL 4 MG/2ML IJ SOLN
4.0000 mg | Freq: Four times a day (QID) | INTRAMUSCULAR | Status: DC | PRN
  Administered 2014-08-05: 4 mg via INTRAVENOUS
  Filled 2014-07-28: qty 2

## 2014-07-28 MED ORDER — ACETAMINOPHEN 325 MG PO TABS
325.0000 mg | ORAL_TABLET | ORAL | Status: DC | PRN
  Administered 2014-07-29 – 2014-08-13 (×14): 650 mg via ORAL
  Filled 2014-07-28 (×13): qty 2

## 2014-07-28 MED ORDER — FLEET ENEMA 7-19 GM/118ML RE ENEM: 1.0000 | ENEMA | Freq: Once | RECTAL | Status: AC | PRN

## 2014-07-28 MED ORDER — LIDOCAINE HCL 2 % EX GEL
1.0000 "application " | CUTANEOUS | Status: DC | PRN
  Filled 2014-07-28: qty 5

## 2014-07-28 MED ORDER — ONDANSETRON HCL 4 MG PO TABS
4.0000 mg | ORAL_TABLET | Freq: Four times a day (QID) | ORAL | Status: DC | PRN
  Administered 2014-07-29 – 2014-08-11 (×2): 4 mg via ORAL
  Filled 2014-07-28 (×3): qty 1

## 2014-07-28 MED ORDER — INSULIN ASPART 100 UNIT/ML ~~LOC~~ SOLN
0.0000 [IU] | Freq: Three times a day (TID) | SUBCUTANEOUS | Status: DC
  Administered 2014-07-28 – 2014-08-06 (×7): 1 [IU] via SUBCUTANEOUS
  Administered 2014-08-07: 2 [IU] via SUBCUTANEOUS
  Administered 2014-08-08 (×3): 1 [IU] via SUBCUTANEOUS
  Administered 2014-08-09 – 2014-08-10 (×3): 2 [IU] via SUBCUTANEOUS
  Administered 2014-08-11 – 2014-08-12 (×6): 1 [IU] via SUBCUTANEOUS
  Administered 2014-08-13: 2 [IU] via SUBCUTANEOUS
  Administered 2014-08-13 (×2): 1 [IU] via SUBCUTANEOUS

## 2014-07-28 MED ORDER — PRO-STAT SUGAR FREE PO LIQD
30.0000 mL | Freq: Three times a day (TID) | ORAL | Status: DC
  Administered 2014-07-28 – 2014-08-13 (×44): 30 mL via ORAL
  Filled 2014-07-28 (×54): qty 30

## 2014-07-28 MED ORDER — GUAIFENESIN-DM 100-10 MG/5ML PO SYRP
5.0000 mL | ORAL_SOLUTION | Freq: Four times a day (QID) | ORAL | Status: DC | PRN
  Administered 2014-08-01: 10 mL via ORAL
  Filled 2014-07-28: qty 10

## 2014-07-28 MED ORDER — SENNOSIDES-DOCUSATE SODIUM 8.6-50 MG PO TABS
1.0000 | ORAL_TABLET | Freq: Every evening | ORAL | Status: DC | PRN
  Administered 2014-07-30: 1 via ORAL
  Filled 2014-07-28: qty 1

## 2014-07-28 MED ORDER — OMEGA-3-ACID ETHYL ESTERS 1 G PO CAPS
1.0000 g | ORAL_CAPSULE | Freq: Two times a day (BID) | ORAL | Status: DC
  Administered 2014-07-28 – 2014-08-13 (×22): 1 g via ORAL
  Filled 2014-07-28 (×36): qty 1

## 2014-07-28 MED ORDER — TRAZODONE HCL 50 MG PO TABS: 25.0000 mg | ORAL_TABLET | Freq: Every evening | ORAL | Status: DC | PRN

## 2014-07-28 MED ORDER — ALUM & MAG HYDROXIDE-SIMETH 200-200-20 MG/5ML PO SUSP
30.0000 mL | ORAL | Status: DC | PRN
  Administered 2014-08-11: 30 mL via ORAL
  Filled 2014-07-28: qty 30

## 2014-07-28 MED ORDER — PREDNISONE 10 MG PO TABS
10.0000 mg | ORAL_TABLET | Freq: Every day | ORAL | Status: AC
  Administered 2014-07-29 – 2014-08-02 (×5): 10 mg via ORAL
  Filled 2014-07-28 (×5): qty 1

## 2014-07-28 MED ORDER — INSULIN ASPART 100 UNIT/ML ~~LOC~~ SOLN
0.0000 [IU] | Freq: Every day | SUBCUTANEOUS | Status: DC
  Administered 2014-08-12: 1 [IU] via SUBCUTANEOUS

## 2014-07-28 MED ORDER — TAMSULOSIN HCL 0.4 MG PO CAPS
0.4000 mg | ORAL_CAPSULE | Freq: Every day | ORAL | Status: DC
Start: 2014-07-28 — End: 2014-07-31
  Administered 2014-07-28 – 2014-07-30 (×3): 0.4 mg via ORAL
  Filled 2014-07-28 (×4): qty 1

## 2014-07-28 MED ORDER — METFORMIN HCL 500 MG PO TABS
500.0000 mg | ORAL_TABLET | Freq: Two times a day (BID) | ORAL | Status: DC
  Administered 2014-07-28 – 2014-08-13 (×33): 500 mg via ORAL
  Filled 2014-07-28 (×37): qty 1

## 2014-07-28 MED ORDER — MIRTAZAPINE 15 MG PO TABS
15.0000 mg | ORAL_TABLET | Freq: Every day | ORAL | Status: DC
  Administered 2014-07-28 – 2014-08-13 (×17): 15 mg via ORAL
  Filled 2014-07-28 (×18): qty 1

## 2014-07-28 MED ORDER — FINASTERIDE 5 MG PO TABS
5.0000 mg | ORAL_TABLET | Freq: Every day | ORAL | Status: DC
  Administered 2014-07-28 – 2014-08-13 (×17): 5 mg via ORAL
  Filled 2014-07-28 (×19): qty 1

## 2014-07-28 NOTE — Progress Notes (Signed)
Patient admitted to [phone] about 1600. Family including wife and daughter present at bedside to help answer history questions. Patient alert. Vitals stable. Pink safety plan and rehab process explained to family and patient. Family denied any questions. Patient given rehab notebook and wife signed fall safety sheet.

## 2014-07-28 NOTE — PMR Pre-admission (Signed)
[facility] PMR Admission Coordinator Pre-Admission Assessment  Patient: Roy Mitchell is an 81 y.o., male MRN: [id number] DOB: Apr 19, 1924 Height: 5\' 6"  (644.0 cm) Weight: 79.833 kg (176 lb)  Insurance Information  PRIMARY: Medicare A & B      Policy#: [id number]      Subscriber: self Pre-Cert#: verified in [place]/Epic      Employer: retired [employer]. Date: A & B: 09-15-90     Deduct: $1288      Out of Pocket Max: none       Life Max: unlimited CIR: 100%      SNF: 100% days 1-20; 80% days 21-100 (100 days max) Outpatient: 80%     Co-Pay: 20% Home Health: 100%      Co-Pay: none DME: 80%     Co-Pay: 20% Providers: pt's preference  As of 07-28-14: pt has 28 full Medicare days left, 30 full copay days left and 60 lifetime reserve days.  SECONDARY: AARP      Policy#: [id number]      Subscriber: self Benefits:  Phone #: [phone]       Emergency Contact Information Contact Information    Name Relation Home Work Mobile   Bubar,Catherine Spouse [phone]  [phone]   Sauser,Lynne Daughter   [phone]      Current Medical History  Patient Admitting Diagnosis: L SDH  History of Present Illness: Roy Mitchell is a 81 y.o. male with history of DDD, HTN, vertigo, tinnitus,TVAR, oxygen dependent at nights, PAD who sustained a fall 2 days PTA on 06/26/14 with new onset of word finding difficulty as well as difficulty using eating utensils. CT head done revealing acute left parietal SDH with 79mm thickness. Chronic plavix discontinued with plans for conservative care. Patient had decline in speech as well as right sided weakness later that day and was taken to OR by Dr. Ellene Route for left parietal craniotomy for evacuation of subdural hematoma. Post op intubated till 12/13 but has issues with hypoxia requiring NRB mask. CXR with total right lung collapse and required bronchoscopy 12/15 to help clear mucous plugging.he was started on empiric antibiotics and BAL cultures pending.    Mentation has improved but patient without vocalization with dysphagia therefore remains NPO and panda tube feeds to be initiated today. CIR recommended by rehab team and MD in December but pt was more appropriate for [facility]. Pt was admitted to [facility] on 07/08/14 and has been making progress with therapies and improvements in his cognitive status. MD and rehab team at [facility] are now recommending inpatient rehab and pt and his family are very motivated to maximize his functional return.  Patient's medical record from [facility] has been reviewed by the rehabilitation admission coordinator and physician.   Past Medical History  Past Medical History  Diagnosis Date  . Hypercholesterolemia   . Benign prostatic hypertrophy   . GERD (gastroesophageal reflux disease)   . DDD (degenerative disc disease), cervical   . Tinnitus   . Multinodular goiter   . Prediabetes   . DDD (degenerative disc disease), lumbar   . Hypertension   . Pulmonary nodules   . Anxiety disorder     Family History  family history includes Congestive Heart Failure in his mother.  Prior Rehab/Hospitalizations: pt had previous admit in December 2015 after initial fall/SDH and had acute rehab then. Pt was transferred to [facility] and has been receiving therapy there. Pt also had home health PT for balance issues in the fall.  Current Medications See MAR  Patients Current Diet:  Regular diet, chopped meats, supervision  Precautions / Restrictions Precautions Precautions: Fall Restrictions Weight Bearing Restrictions: No   Prior Activity Level Community (5-7x/wk): Pt was active prior to his fall in December 2015 and enjoyed being outside, cutting his grass on his riding lawnmower. He is a sports fan and enjoys baseball. He did have some balance issues and used a cane.   Home Assistive Devices / Equipment     Prior Functional Level Current Functional Level  Bed Mobility   Independent  Mod assist (min to mod assist)   Transfers  Independent  Mod assist (min to moderate assist stand pivot transfers with FWW)   Mobility - Walk/Wheelchair  Independent (using a cane)  Min assist (Min assist to ambulate 3' interval with FWW)   Upper Body Dressing  Independent  Min assist (donned gown with minimal assistance)   Lower Body Dressing  Independent  Min assist   Grooming  Independent  Other (set up assistance)   Eating/Drinking  Independent  Other (supervision)   Toilet Transfer  Independent  Mod assist (mod assist to [facility])   Bladder Continence   WFL  curently using urinal   Bowel Management  WFL  last BM on 07-26-14? RN states she gave stool softener on 07-27-14   Stair Climbing   Independent  Other (not assessed, anticipate needs)   Communication  WFL  some dysarthria noted but improving per family   Memory  WFL  unable to assess at this time.   Cooking/Meal Prep  Independent, though wife did 2 of household tasks      Housework  wife did the majority of household tasks    Money Management  Independent    Driving    Wife did the majority of the driving    Special needs/care consideration BiPAP/CPAP - pt uses CPAP at home but has not been using it consistently at [facility]. He also uses 2L O2 at night with his CPAP. CPM no Continuous Drip IV no  Dialysis no          Life Vest no  Oxygen - uses 2L O2 at night with his CPAP Special Bed no Trach Size no Wound Vac (area) no        Skin - no issues                                Bowel mgmt: RN gave stool softener on 07-27-14 Bladder mgmt: currently using urinal Diabetic mgmt - not diabetic per wife but pt's blood sugars were checked while pt was on steroids. Pt has been receiving insulin as well.  Previous Home Environment Living Arrangements: Spouse/significant other  Lives With: Spouse Available Help at Discharge: Family Type of Home: House Home Layout: One  level Home Access: Ramped entrance Bathroom Shower/Tub: Multimedia programmer: Handicapped height [place]: Yes Type of Home Care Services: Home PT  Discharge Living Setting Plans for Discharge Living Setting: Patient's home Type of Home at Discharge: House Discharge Home Layout: One level Discharge Home Access: [place] entrance Discharge Bathroom Shower/Tub: Walk-in shower Discharge Bathroom Toilet: Handicapped height Does the patient have any problems obtaining your medications?: No  Social/Family/Support Systems Patient Roles: Spouse Contact Information: wife Barnetta Chapel is primary contact Anticipated Caregiver: wife  Anticipated Caregiver's Contact Information: see above Ability/Limitations of Caregiver: no limitations from wife and dtr is a Risk manager  Availability: 24/7 Discharge Plan Discussed with Primary Caregiver: Yes Is Caregiver In Agreement with Plan?: Yes Does Caregiver/Family have Issues with Lodging/Transportation while Pt is in Rehab?: No  Goals/Additional Needs Patient/Family Goal for Rehab: Min assist and supervision with PT, OT and SLP Expected length of stay: 11-14 days Cultural Considerations: pt is Catholic Dietary Needs: regular diet, chopped meats with supervision Equipment Needs: to be determined Pt/Family Agrees to Admission and willing to participate: Yes (met with pt,wife and son at [facility] on 07-27-14 and 07-28-14) Program Orientation Provided & Reviewed with Pt/Caregiver Including Roles  & Responsibilities: Yes  Patient Condition: I met with pt, his wife and son at [facility] on 07-27-14 to discuss the possibility of inpatient rehab. They were motivated to pursue this and already familiar with the program as pt was considered for inpatient rehab in December 2015 after the original fall/craniotomy. This 81 year old pt was previously independent at home with the use of a cane and enjoyed being outside doing  his yard work. He did have balance issues at baseline and relied on the cane. Pt has had an involved medical course since his L parietal SDH/craniotomy in December 2015 and was discharged to [facility] for further medical care. Pt has now made progress in his overall activity tolerance and cognitively has improved as well. Pt is currently requiring moderate assistance with transfers and limited gait and minimal to moderate help with self care tasks. He will benefit greatly from the multi-disciplinary team of skilled PT, OT, SLP and rehab nursing to maximize his functional return following this involved medical course. PT, OT and rehab nursing will focus on increasing strength for greater independence with bed mobility, transfers, gait and self care skills. In addition, skilled speech therapy will progress pt's diet/monitor pt's swallowing needs and assess for further cognitive impairment following his craniotomy. Pt will also benefit from rehab physician intervention to monitor his overall medical status following his craniotomy and medical course at [facility]. Discussed case with Dr. Riley Kill who stated that pt is a good candidate for inpatient rehab and received medical clearance from Dr. Sharyon Medicus at [facility]. Pt and his family are motivated to come to inpatient rehab and will benefit from the intensive services of skilled therapy under rehab physician guidance. Pt will be admitted today on 07-28-14.   Preadmission Screen Completed By:  Juliann Mule, PT, 07/28/2014 11:45 AM ______________________________________________________________________   Discussed status with Dr. Riley Kill on 07-28-14 at 1145 and received telephone approval for admission today.  Admission Coordinator:  Juliann Mule, PT time 1145/Date 07-28-14   Assessment/Plan: Diagnosis: left SDH 1. Does the need for close, 24 hr/day  Medical supervision in concert with the patient's rehab needs make it unreasonable for this patient to be served in a less  intensive setting? Yes 2. Co-Morbidities requiring supervision/potential complications: nutrition, anemia 3. Due to bladder management, bowel management, safety, skin/wound care, disease management, medication administration, pain management and patient education, does the patient require 24 hr/day rehab nursing? Yes 4. Does the patient require coordinated care of a physician, rehab nurse, PT (1-2 hrs/day, 5 days/week), OT (1-2 hrs/day, 5 days/week) and SLP (1-2 hrs/day, 5 days/week) to address physical and functional deficits in the context of the above medical diagnosis(es)? Yes Addressing deficits in the following areas: balance, endurance, locomotion, strength, transferring, bowel/bladder control, bathing, dressing, feeding, grooming, toileting, cognition, speech and psychosocial support 5. Can the patient actively participate in an intensive therapy program of at least 3 hrs of therapy 5 days a  week? Yes 6. The potential for patient to make measurable gains while on inpatient rehab is excellent 7. Anticipated functional outcomes upon discharge from inpatients are: supervision and min assist PT, supervision and min assist OT, supervision and min assist SLP 8. Estimated rehab length of stay to reach the above functional goals is: 11-14 days 9. Does the patient have adequate social supports to accommodate these discharge functional goals? Yes 10. Anticipated D/C setting: Home 11. Anticipated post D/C treatments: HH therapy and Outpatient therapy 12. Overall Rehab/Functional Prognosis: excellent    RECOMMENDATIONS: This patient's condition is appropriate for continued rehabilitative care in the following setting: CIR Patient has agreed to participate in recommended program. Yes Note that insurance prior authorization may be required for reimbursement for recommended care.  Comment: admit to inpatient rehab today  [name], MD, [facility] 07/28/2014   Nanetta Batty, PT 07/28/2014

## 2014-07-28 NOTE — H&P (Signed)
Physical Medicine and Rehabilitation Admission H&P   CC: Left SDH   HPI: Roy Mitchell is a 81 y.o. male with history of DDD, HTN, vertigo, tinnitus,TVAR, oxygen dependent at nights, PAD who sustained a fall 2 days PTA on 06/26/14 with new onset of word finding difficulty as well as difficulty using eating utensils. CT head done revealing acute left parietal SDH with 17mm thickness. Chronic plavix discontinued with plans for conservative care. Patient had decline in speech as well as right sided weakness later that day and was taken to OR by Dr. Danielle Dess for left parietal craniotomy for evacuation of subdural hematoma. Post op intubated till 12/13 but has issues with hypoxia requiring NRB mask. CXR with total right lung collapse and required bronchoscopy 12/15 to help clear mucous plugging. He has transient improvement but developed lethargy with increase in oxygen needs on 12/18 requiring venturi mask. He was treated with IV antibiotics for mucopurulent bronchitis and was kept NPO due to dysphagia. NGT was placed for nutritional support.   He was transferred to [facility] and respiratory status has improved and he but continues to require supplemental oxygen. MBS was done on 12/29 and diet has slowly been advanced to regular with nectar liquids. He has had poor appetite and remeron was added to help with mood as well as appetite. He has been having problems with hematuria and has completed treatment for UTI. Difucan was added to help treat thrush. He is showing improvement in activity tolerance.    Review of Systems  HENT: Negative for hearing loss.  Eyes: Negative for blurred vision and double vision.  Respiratory: Positive for wheezing. Negative for cough and shortness of breath.  Cardiovascular: Negative for chest pain and palpitations.  Gastrointestinal: Positive for heartburn and constipation. Negative for nausea and vomiting.  Genitourinary: Positive for urgency and frequency.    Musculoskeletal: Negative for myalgias and back pain.  Neurological: Negative for dizziness and headaches.      Past Medical History  Diagnosis Date  . Hypercholesterolemia   . Benign prostatic hypertrophy   . GERD (gastroesophageal reflux disease)   . DDD (degenerative disc disease), cervical   . Tinnitus   . Multinodular goiter   . Prediabetes   . DDD (degenerative disc disease), lumbar   . Hypertension   . Pulmonary nodules   . Anxiety disorder     Past Surgical History  Procedure Laterality Date  . Aortic valve replacement  01/2014  . History of blood transfusion    . Hernia repair      6 months old  . Craniotomy Left 06/26/2014    Procedure: Left Parietal Craniotomy for Subdural Hematoma; Surgeon: Barnett Abu, MD; Location: [facility] NEURO ORS; Service: Neurosurgery; Laterality: Left;     Family History  Problem Relation Age of Onset  . Congestive Heart Failure Mother      Social History: Married. Had just completed months of HH therapy and in process of transitioning to cardiac rehab. Independent for ADL and ambulation but required assistance with sit to stand tranfers. Per reports that he has quit smoking years ago. He has never used smokeless tobacco. He reports that he does not drink alcohol or use illicit drugs.    Allergies  Allergen Reactions  . Ace Inhibitors Cough  . Penicillins Rash    Medications Prior to Admission  Medication Sig Dispense Refill  . albuterol (PROVENTIL) (2.5 MG/3ML) 0.083% nebulizer solution Take 2.5 mg by nebulization every 6 (six) hours as needed for wheezing or  shortness of breath.    . cefTRIAXone (ROCEPHIN) 20 MG/ML IVPB Inject 50 mLs (1 g total) into the vein daily. 50 mL 1  . finasteride (PROSCAR) 5 MG tablet Take 5 mg by mouth daily.    . insulin aspart (NOVOLOG) 100 UNIT/ML injection Inject 0-9 Units into the skin  every 4 (four) hours. 10 mL 11  . insulin glargine (LANTUS) 100 UNIT/ML injection Inject 0.05 mLs (5 Units total) into the skin daily. 10 mL 11  . levETIRAcetam 250 mg in sodium chloride 0.9 % 100 mL Inject 250 mg into the vein every 12 (twelve) hours.    . methylPREDNISolone sodium succinate (SOLU-MEDROL) 40 mg/mL injection Inject 1 mL (40 mg total) into the vein daily. 1 each 0  . Nutritional Supplements (FEEDING SUPPLEMENT, VITAL AF 1.2 CAL,) LIQD Place 1,000 mLs into feeding tube continuous.    . tamsulosin (FLOMAX) 0.4 MG CAPS capsule Take 0.4 mg by mouth at bedtime.    . Water For Irrigation, Sterile (FREE WATER) SOLN Place 100 mLs into feeding tube every 8 (eight) hours.      Home:    Functional History:    Functional Status:  Mobility:      ADL: Working on BUE strengthening exercises.  Min assist with UB care Moderate assist with LB care. Moderate assist with toileting.   Cognition: He is able to speak 1-3 words with compensatory strategies.  Tolerating dysphagia 2 diet with nectar liquids.    Physical Exam: There were no vitals taken for this visit. Physical Exam  Nursing note and vitals reviewed. Constitutional: He is oriented to person, place, and time. He appears well-developed and well-nourished.  HENT:  Head: Normocephalic and atraumatic.  Well healed left crani incision.  Eyes: Conjunctivae are normal. Pupils are equal, round, and reactive to light.  Neck: Normal range of motion. Neck supple.  Cardiovascular: Normal rate and regular rhythm.  No murmur heard. Respiratory: Effort normal. He has occasional wheezes.  Coarse crackles LLL with diffuse wheezes thorough out the lungs.  GI: Soft. Bowel sounds are normal. He exhibits no distension. There is no tenderness.  Musculoskeletal: He exhibits no edema or tenderness.  Neurological: He is alert and oriented to person, place, and time.  Mild dysphonia with  expressive deficits. Able to answer basic biographic questions. Needs occasional cues to follow simple one step commands and mild apraxia noted with MMT. UE grossly 4/5 prox to distal. LE: 2+ hf, 3 ke and 4/5 ankle dorsi and plantarflexion. Sensory exam grossly intaact Skin: Skin is warm and dry.  BLE with tenting and muscle wasting.  Psych: pt pleasant and generally appropriate    Lab Results Last 48 Hours    Results for orders placed or performed during the hospital encounter of 07/08/14 (from the past 48 hour(s))  CBC Status: Abnormal   Collection Time: 07/27/14 5:33 AM  Result Value Ref Range   WBC 8.6 4.0 - 10.5 K/uL   RBC 4.47 4.22 - 5.81 MIL/uL   Hemoglobin 11.7 (L) 13.0 - 17.0 g/dL   HCT 40.936.5 (L) 81.139.0 - 91.452.0 %   MCV 81.7 78.0 - 100.0 fL   MCH 26.2 26.0 - 34.0 pg   MCHC 32.1 30.0 - 36.0 g/dL   RDW 78.216.6 (H) 95.611.5 - 21.315.5 %   Platelets 112 (L) 150 - 400 K/uL    Comment: CONSISTENT WITH PREVIOUS RESULT      Imaging Results (Last 48 hours)    No results found.  Medical Problem List and Plan: 1. Functional deficits secondary to Left SDH complicated by acute respiratory failure 2. DVT Prophylaxis/Anticoagulation: Mechanical: Sequential compression devices, below knee Bilateral lower extremities 3. Pain Management: tylenol prn 4. Mood: Continue remeron at bedtime. LCSW to follow for evaluation and support.  5. Neuropsych: This patient is not capable of making decisions on his own behalf. 6. Skin/Wound Care: Routine pressure relief measures.  7. Fluids/Electrolytes/Nutrition: Monitor I/O. Check CMET in am. Continue pro stat supplement tid. Offer ensure pudding or magic cup between meals.  8. Hematuria: Has h/o BPH--sees Dr. Saddie Benders in [place]. Continue flomax and finasteride. Monitor voiding with PVR check. Monitor H/H.  9. Reactive airway disease: Was on nocturnal oxygen with nebs bid PTA. He has had increase in  oxygen needs past couple of days and has h/o recurrent PNA. Will check Will decrease prednisone to 10 mg and slowly taper prednisone to 5 mg daily per recommendations.  10. HTN: Will monitor BP every 8 hours. Continue metoprolol bid.  11. Seizure prophylaxis: Will continue Keppra bid 12. Acute respiratory failure: Continue budesonide nebs bid. Encourage IS every 4 hours while awake.    Post Admission Physician Evaluation: 1. Functional deficits secondary to Left SDH complicated by ARF 2. Patient is admitted to receive collaborative, interdisciplinary care between the physiatrist, rehab nursing staff, and therapy team. 3. Patient's level of medical complexity and substantial therapy needs in context of that medical necessity cannot be provided at a lesser intensity of care such as a SNF. 4. Patient has experienced substantial functional loss from his/her baseline which was documented above under the "Functional History" and "Functional Status" headings. Judging by the patient's diagnosis, physical exam, and functional history, the patient has potential for functional progress which will result in measurable gains while on inpatient rehab. These gains will be of substantial and practical use upon discharge in facilitating mobility and self-care at the household level. 5. Physiatrist will provide 24 hour management of medical needs as well as oversight of the therapy plan/treatment and provide guidance as appropriate regarding the interaction of the two. 6. 24 hour rehab nursing will assist with bladder management, bowel management, safety, skin/wound care, disease management, medication administration, pain management and patient education and help integrate therapy concepts, techniques,education, etc. 7. PT will assess and treat for/with: Lower extremity strength, range of motion, stamina, balance, functional mobility, safety, adaptive techniques and equipment, NMR, cognitive perceptual rx,  behavior, family ed. Goals are: supervision to min assist. 8. OT will assess and treat for/with: ADL's, functional mobility, safety, upper extremity strength, adaptive techniques and equipment, NMR, cognitive perceptual rx, family and patient ed. Goals are: supervisino to min assist. Therapy may not proceed with showering this patient. 9. SLP will assess and treat for/with: cognition, language, swallowing. Goals are: supervision to min assist. 10. Case Management and Social Worker will assess and treat for psychological issues and discharge planning. 11. Team conference will be held weekly to assess progress toward goals and to determine barriers to discharge. 12. Patient will receive at least 3 hours of therapy per day at least 5 days per week. 13. ELOS: 11-14 days  14. Prognosis: good     Ranelle Oyster, MD, [facility] Medicine [facility] Rehabilitation 07/28/2014

## 2014-07-29 ENCOUNTER — Inpatient Hospital Stay (HOSPITAL_COMMUNITY): Payer: Medicare Other | Admitting: Speech Pathology

## 2014-07-29 ENCOUNTER — Inpatient Hospital Stay (HOSPITAL_COMMUNITY): Payer: Medicare Other | Admitting: Occupational Therapy

## 2014-07-29 ENCOUNTER — Inpatient Hospital Stay (HOSPITAL_COMMUNITY): Payer: Medicare Other

## 2014-07-29 DIAGNOSIS — J45909 Unspecified asthma, uncomplicated: Secondary | ICD-10-CM | POA: Diagnosis present

## 2014-07-29 DIAGNOSIS — R1314 Dysphagia, pharyngoesophageal phase: Secondary | ICD-10-CM | POA: Diagnosis present

## 2014-07-29 LAB — URINE MICROSCOPIC-ADD ON

## 2014-07-29 LAB — GLUCOSE, CAPILLARY
GLUCOSE-CAPILLARY: 132 mg/dL — AB (ref 70–99)
Glucose-Capillary: 92 mg/dL (ref 70–99)
Glucose-Capillary: 143 mg/dL — ABNORMAL HIGH (ref 70–99)
Glucose-Capillary: 101 mg/dL — ABNORMAL HIGH (ref 70–99)

## 2014-07-29 LAB — CBC WITH DIFFERENTIAL/PLATELET
BASOS PCT: 0 % (ref 0–1)
Basophils Absolute: 0 10*3/uL (ref 0.0–0.1)
EOS ABS: 0.2 10*3/uL (ref 0.0–0.7)
EOS PCT: 2 % (ref 0–5)
HCT: 40.2 % (ref 39.0–52.0)
Hemoglobin: 12.8 g/dL — ABNORMAL LOW (ref 13.0–17.0)
Lymphocytes Relative: 20 % (ref 12–46)
Lymphs Abs: 1.7 10*3/uL (ref 0.7–4.0)
MCH: 26.3 pg (ref 26.0–34.0)
MCHC: 31.8 g/dL (ref 30.0–36.0)
MCV: 82.7 fL (ref 78.0–100.0)
MONO ABS: 0.8 10*3/uL (ref 0.1–1.0)
MONOS PCT: 9 % (ref 3–12)
Neutro Abs: 5.9 10*3/uL (ref 1.7–7.7)
Neutrophils Relative %: 69 % (ref 43–77)
Platelets: 126 10*3/uL — ABNORMAL LOW (ref 150–400)
RBC: 4.86 MIL/uL (ref 4.22–5.81)
RDW: 16.8 % — ABNORMAL HIGH (ref 11.5–15.5)
WBC: 8.6 10*3/uL (ref 4.0–10.5)

## 2014-07-29 LAB — COMPREHENSIVE METABOLIC PANEL
ALT: 13 U/L (ref 0–53)
AST: 13 U/L (ref 0–37)
Albumin: 2.4 g/dL — ABNORMAL LOW (ref 3.5–5.2)
Alkaline Phosphatase: 85 U/L (ref 39–117)
Anion gap: 3 — ABNORMAL LOW (ref 5–15)
BUN: 21 mg/dL (ref 6–23)
CHLORIDE: 99 meq/L (ref 96–112)
CO2: 33 mmol/L — AB (ref 19–32)
CREATININE: 0.68 mg/dL (ref 0.50–1.35)
Calcium: 8.7 mg/dL (ref 8.4–10.5)
GFR calc Af Amer: >90 mL/min (ref >90)
GFR calc non Af Amer: 83 mL/min — ABNORMAL LOW (ref >90)
Glucose, Bld: 88 mg/dL (ref 70–99)
Potassium: 5.2 mmol/L — ABNORMAL HIGH (ref 3.5–5.1)
Sodium: 135 mmol/L (ref 135–145)
Total Bilirubin: 0.5 mg/dL (ref 0.3–1.2)
Total Protein: 5.8 g/dL — ABNORMAL LOW (ref 6.0–8.3)

## 2014-07-29 LAB — URINALYSIS, ROUTINE W REFLEX MICROSCOPIC
Bilirubin Urine: NEGATIVE
Glucose, UA: NEGATIVE mg/dL
Hgb urine dipstick: NEGATIVE
Ketones, ur: NEGATIVE mg/dL
Nitrite: NEGATIVE
Protein, ur: NEGATIVE mg/dL
SPECIFIC GRAVITY, URINE: 1.023 (ref 1.005–1.030)
UROBILINOGEN UA: 1 mg/dL (ref 0.0–1.0)
pH: 6 (ref 5.0–8.0)

## 2014-07-29 MED ORDER — ENSURE PUDDING PO PUDG
1.0000 | Freq: Two times a day (BID) | ORAL | Status: DC
  Administered 2014-07-29 – 2014-08-04 (×7): 1 via ORAL

## 2014-07-29 MED ORDER — LEVETIRACETAM 100 MG/ML PO SOLN
250.0000 mg | Freq: Two times a day (BID) | ORAL | Status: DC
  Administered 2014-07-29 – 2014-08-13 (×31): 250 mg via ORAL
  Filled 2014-07-29 (×36): qty 2.5

## 2014-07-29 NOTE — Progress Notes (Signed)
Social Work Assessment and Plan  Patient Details  Name: Roy Mitchell MRN: [id number] Date of Birth: Feb 20, 1924  Today's Date: 07/29/2014  Problem List:  Patient Active Problem List   Diagnosis Date Noted  . Mild reactive airways disease 07/29/2014  . Dysphagia, pharyngoesophageal phase 07/29/2014  . DNR (do not resuscitate) 07/04/2014  . Lung collapse   . Acute respiratory failure with hypoxia 06/27/2014  . Acute respiratory failure   . Traumatic subdural hematoma with loss of consciousness of 6 hours to 24 hours 06/26/2014  . Abnormality of gait 06/26/2014  . HTN (hypertension) 06/26/2014  . Anxiety and depression 06/26/2014   Past Medical History:  Past Medical History  Diagnosis Date  . Hypercholesterolemia   . Benign prostatic hypertrophy   . GERD (gastroesophageal reflux disease)   . DDD (degenerative disc disease), cervical   . Tinnitus   . Multinodular goiter   . Prediabetes   . DDD (degenerative disc disease), lumbar   . Hypertension   . Pulmonary nodules   . Anxiety disorder    Past Surgical History:  Past Surgical History  Procedure Laterality Date  . Aortic valve replacement  01/2014  . History of blood transfusion    . Hernia repair      65 months old  . Craniotomy Left 06/26/2014    Procedure: Left Parietal Craniotomy for Subdural Hematoma;  Surgeon: Kristeen Miss, MD;  Location: [facility] NEURO ORS;  Service: Neurosurgery;  Laterality: Left;   Social History:  reports that he has quit smoking. He has never used smokeless tobacco. He reports that he does not drink alcohol or use illicit drugs.  Family / Support Systems Marital Status: Married How Long?: 76 years in February Patient Roles: Spouse Spouse/Significant Other: Garlen Reinig - wife - [phone]/ [phone] (cell) Children: Delphina Cahill - dtr [phone]  and 2 other local dtrs and 2 sons out of state, but supportive Anticipated Caregiver: wife  Ability/Limitations of Caregiver: no limitations from wife  and dtr is a Designer, jewellery; wife will hire private duty help, if need be Caregiver Availability: 24/7 Family Dynamics: pt reports a close, supportive family.    Social History Preferred language: English Religion:  Education: high school Read: Yes Write: Yes Employment Status: Retired Date Retired/Disabled/Unemployed: 1992 Age Retired: 66 Public relations account executive Issues: none reported Guardian/Conservator: N/A   Abuse/Neglect Physical Abuse: Denies Verbal Abuse: Denies Sexual Abuse: Denies Exploitation of patient/patient's resources: Denies Self-Neglect: Denies  Emotional Status Pt's affect, behavior and adjustment status: Pt reports feeling frustrated and shared that "this is hard", but is staying up emotionally and is motivated to get well and get home. Recent Psychosocial Issues: Pt was just getting to a place where he had receovered from his previous heart issues and was preparing to start cardiac rehab at [facility] when he fell. Psychiatric History: Anxiety Disorder listed in the chart, but pt/family did not discuss this with CSW Substance Abuse History: none reported  Patient / Family Perceptions, Expectations & Goals Pt/Family understanding of illness & functional limitations: Pt/family express an understanding of pt's condition and limitations. Premorbid pt/family roles/activities: Pt enjoys spending time with his family, being outside, and watching baseball. Anticipated changes in roles/activities/participation: Pt hopes to return to his activities once he is able. Pt/family expectations/goals: Pt wants to "turn 63" which occurs in March.  He is motivated to get well.  Community [employer] Agencies: None Premorbid Home Care/DME Agencies: Other (Comment) [facility] and [facility] for private duty care)  Transportation available at discharge: family Resource referrals recommended: Neuropsychology  Discharge  Planning Living Arrangements: Spouse/significant other Support Systems: Spouse/significant other, Children, Immunologist, Home care staff Type of Residence: Private residence Insurance Resources: [employer], Multimedia programmer (specify) Financial Resources: Radio broadcast assistant Screen Referred: No Money Management: Spouse Does the patient have any problems obtaining your medications?: No Home Management: Pt's wife can manage the home.  She has resources to call upon for assistance as needed. Patient/Family Preliminary Plans: Pt plans to return home to his home with support from his wife and family and paid private duty, as needed. Social Work Anticipated Follow Up Needs: HH/OP Expected length of stay: 11-14 days  Clinical Impression CSW met with pt ,his wife, and his dtr, Sula Soda to introduce self and role of CSW, as well as to complete assessment.  Pt and family were very appreciative of CSW and other staff's support.  Pt came to CIR from [facility] and has been in the hospital since December.  He reports it is "hard", but is keeping his spirits up and feels very supported by his family.  Pt's wife is supportive and will hire 24 hr private duty staff, if needed.  Dtrs are available intermittently.  Pt has cane, walkers, w/c, hospital bed, ramp, 2 BSCs already at home.  No current concerns/questions/needs at this time, but CSW will continue to follow and assist as needed.  Stephanos Fan, Silvestre Mesi 07/29/2014, 2:28 PM

## 2014-07-29 NOTE — Progress Notes (Signed)
Patient information reviewed and entered into eRehab system by Nicey Krah, RN, CRRN, PPS Coordinator.  Information including medical coding and functional independence measure will be reviewed and updated through discharge.     Per nursing patient was given "Data Collection Information Summary for Patients in Inpatient Rehabilitation Facilities with attached "Privacy Act Statement-Health Care Records" upon admission.  

## 2014-07-29 NOTE — Progress Notes (Signed)
Roy Staggers, MD Physician Signed Physical Medicine and Rehabilitation PMR Pre-admission 07/28/2014 11:43 AM  Related encounter: Admission (Discharged) from 07/08/2014 in [place] PMR Admission Coordinator Pre-Admission Assessment  Patient: Roy Mitchell is an 81 y.o., male MRN: [id number] DOB: January 23, 1924 Height: 5' 6" (167.6 cm) Weight: 79.833 kg (176 lb)  Insurance Information  PRIMARY: Medicare A & B Policy#: [id number] Subscriber: self Pre-Cert#: verified in [place]/Epic Employer: retired Runner, broadcasting/film/video. Date: A & B: 09-15-90 Deduct: $1288 Out of Pocket Max: none Life Max: unlimited CIR: 100% SNF: 100% days 1-20; 80% days 21-100 (100 days max) Outpatient: 80% Co-Pay: 20% Home Health: 100% Co-Pay: none DME: 80% Co-Pay: 20% Providers: pt's preference  As of 07-28-14: pt has 28 full Medicare days left, 30 full copay days left and 60 lifetime reserve days.  SECONDARY: AARP Policy#: [id number] Subscriber: self Benefits: Phone #: [phone]   Emergency Contact Information Contact Information    Name Relation Home Work Mobile   Brosky,Catherine Spouse [phone]  [phone]   Seehafer,Lynne Daughter   [phone]      Current Medical History  Patient Admitting Diagnosis: L SDH  History of Present Illness: Roy Mitchell is a 81 y.o. male with history of DDD, HTN, vertigo, tinnitus,TVAR, oxygen dependent at nights, PAD who sustained a fall 2 days PTA on 06/26/14 with new onset of word finding difficulty as well as difficulty using eating utensils. CT head done revealing acute left parietal SDH with 33m thickness. Chronic plavix discontinued with plans for conservative care. Patient had decline in speech as well as right sided weakness later that day and was taken to OR by Dr. EEllene Routefor left parietal craniotomy for evacuation of subdural  hematoma. Post op intubated till 12/13 but has issues with hypoxia requiring NRB mask. CXR with total right lung collapse and required bronchoscopy 12/15 to help clear mucous plugging.he was started on empiric antibiotics and BAL cultures pending. Mentation has improved but patient without vocalization with dysphagia therefore remains NPO and panda tube feeds to be initiated today. CIR recommended by rehab team and MD in December [facility] and has been making progress with therapies and improvements in his cognitive status. [facility] and pt and his family are very motivated to maximize his functional return.  [facility] admission coordinator and physician.   Past Medical History  Past Medical History  Diagnosis Date  . Hypercholesterolemia   . Benign prostatic hypertrophy   . GERD (gastroesophageal reflux disease)   . DDD (degenerative disc disease), cervical   . Tinnitus   . Multinodular goiter   . Prediabetes   . DDD (degenerative disc disease), lumbar   . Hypertension   . Pulmonary nodules   . Anxiety disorder     Family History  family history includes Congestive Heart Failure in his mother.  Prior Rehab/Hospitalizations: pt had previous admit in December 2015 after initial fall/SDH and had acute rehab then. Pt was transferred to [facility] and has been receiving therapy there. Pt also had home health PT for balance issues in the fall.  Current Medications See MAR  Patients Current Diet: Regular diet, chopped meats, supervision  Precautions / Restrictions Precautions Precautions: Fall Restrictions Weight Bearing Restrictions: No   Prior Activity  Level Community (5-7x/wk): Pt was active prior to his fall in  December 2015 and enjoyed being outside, cutting his grass on his riding lawnmower. He is a sports fan and enjoys baseball. He did have some balance issues and used a cane.   Home Assistive Devices / Equipment     Prior Functional Level Current Functional Level  Bed Mobility  Independent  Mod assist (min to mod assist)   Transfers  Independent  Mod assist (min to moderate assist stand pivot transfers with FWW)   Mobility - Walk/Wheelchair  Independent (using a cane)  Min assist (Min assist to ambulate 3' interval with FWW)   Upper Body Dressing  Independent  Min assist (donned gown with minimal assistance)   Lower Body Dressing  Independent  Min assist   Grooming  Independent  Other (set up assistance)   Eating/Drinking  Independent  Other (supervision)   Toilet Transfer  Independent  Mod assist (mod assist to [facility])   Bladder Continence   WFL  curently using urinal   Bowel Management  WFL  last BM on 07-26-14? RN states she gave stool softener on 07-27-14   Stair Climbing   Independent  Other (not assessed, anticipate needs)   Communication  WFL  some dysarthria noted but improving per family   Memory  WFL  unable to assess at this time.   Cooking/Meal Prep  Independent, though wife did 34 of household tasks     Housework  wife did the majority of household tasks    Money Management  Independent    Driving    Wife did the majority of the driving    Special needs/care consideration BiPAP/CPAP - pt uses CPAP at home but has not been using it consistently at [facility]. He also uses 2L O2 at night with his CPAP. CPM no Continuous Drip IV no  Dialysis no  Life Vest no  Oxygen - uses 2L O2 at night with his CPAP Special Bed no Trach Size no Wound Vac (area) no  Skin - no issues  Bowel mgmt: RN gave stool softener on 07-27-14 Bladder  mgmt: currently using urinal Diabetic mgmt - not diabetic per wife but pt's blood sugars were checked while pt was on steroids. Pt has been receiving insulin as well.  Previous Home Environment Living Arrangements: Spouse/significant other Lives With: Spouse Available Help at Discharge: Family Type of Home: House Home Layout: One level Home Access: Ramped entrance Bathroom Shower/Tub: Multimedia programmer: Handicapped height [place]: Yes Type of Home Care Services: Home PT  Discharge Living Setting Plans for Discharge Living Setting: Patient's home Type of Home at Discharge: House Discharge Home Layout: One level Discharge Home Access: [place] entrance Discharge Bathroom Shower/Tub: Walk-in shower Discharge Bathroom Toilet: Handicapped height Does the patient have any problems obtaining your medications?: No  Social/Family/Support Systems Patient Roles: Spouse Contact Information: wife Barnetta Chapel is primary contact Anticipated Caregiver: wife  Anticipated Caregiver's Contact Information: see above Ability/Limitations of Caregiver: no limitations from wife and dtr is a Designer, jewellery  Caregiver Availability: 24/7 Discharge Plan Discussed with Primary Caregiver: Yes Is Caregiver In Agreement with Plan?: Yes Does Caregiver/Family have Issues with Lodging/Transportation while Pt is in Rehab?: No  Goals/Additional Needs Patient/Family Goal for Rehab: Min assist and supervision with PT, OT and SLP Expected length of stay: 11-14 days Cultural Considerations: pt is Catholic Dietary Needs: regular diet, chopped meats with supervision Equipment Needs: to be determined Pt/Family Agrees to Admission and willing to participate:  Yes (met with pt,wife and son at [place] on 07-27-14 and 07-28-14) Program Orientation Provided & Reviewed with Pt/Caregiver Including Roles & Responsibilities: Yes  Patient Condition: I met with pt, his wife and son at [facility] on 07-27-14 to discuss the possibility of inpatient rehab. They were motivated to pursue this and already familiar with the program as pt was considered for inpatient rehab in December 2015 after the original fall/craniotomy. This 81 year old pt was previously independent at home with the use of a cane and enjoyed being outside doing his yard work. He did have balance issues at baseline and relied on the cane. Pt has had an involved medical course since his L parietal SDH/craniotomy in December 2015 and was discharged to [facility] for further medical care. Pt has now made progress in his overall activity tolerance and cognitively has improved as well. Pt is currently requiring moderate assistance with transfers and limited gait and minimal to moderate help with self care tasks. He will benefit greatly from the multi-disciplinary team of skilled PT, OT, SLP and rehab nursing to maximize his functional return following this involved medical course. PT, OT and rehab nursing will focus on increasing strength for greater independence with bed mobility, transfers, gait and self care skills. In addition, skilled speech therapy will progress pt's diet/monitor pt's swallowing needs and assess for further cognitive impairment following his craniotomy. Pt will also benefit from rehab physician intervention to monitor his overall medical status following his craniotomy and medical course at [facility]. Discussed case with Dr. Naaman Plummer who stated that pt is a good candidate for inpatient rehab and received medical clearance from Dr. Laren Everts at [place]. Pt and his family are motivated to come to inpatient rehab and will benefit from the intensive services of skilled therapy under rehab physician guidance. Pt will be admitted today on 07-28-14.   Preadmission Screen Completed By: Nanetta Batty, PT, 07/28/2014 11:45 AM ______________________________________________________________________  Discussed status with Dr. Naaman Plummer on  07-28-14 at 1145 and received telephone approval for admission today.  Admission Coordinator: Nanetta Batty, PT time 1145/Date 07-28-14   Assessment/Plan: Diagnosis: left SDH 1. Does the need for close, 24 hr/day Medical supervision in concert with the patient's rehab needs make it unreasonable for this patient to be served in a less intensive setting? Yes 2. Co-Morbidities requiring supervision/potential complications: nutrition, anemia 3. Due to bladder management, bowel management, safety, skin/wound care, disease management, medication administration, pain management and patient education, does the patient require 24 hr/day rehab nursing? Yes 4. Does the patient require coordinated care of a physician, rehab nurse, PT (1-2 hrs/day, 5 days/week), OT (1-2 hrs/day, 5 days/week) and SLP (1-2 hrs/day, 5 days/week) to address physical and functional deficits in the context of the above medical diagnosis(es)? Yes Addressing deficits in the following areas: balance, endurance, locomotion, strength, transferring, bowel/bladder control, bathing, dressing, feeding, grooming, toileting, cognition, speech and psychosocial support 5. Can the patient actively participate in an intensive therapy program of at least 3 hrs of therapy 5 days a week? Yes 6. The potential for patient to make measurable gains while on inpatient rehab is excellent 7. Anticipated functional outcomes upon discharge from inpatients are: supervision and min assist PT, supervision and min assist OT, supervision and min assist SLP 8. Estimated rehab length of stay to reach the above functional goals is: 11-14 days 9. Does the patient have adequate social supports to accommodate these discharge functional goals? Yes 10. Anticipated D/C setting: Home 11. Anticipated post D/C treatments:  HH therapy and Outpatient therapy 12. Overall Rehab/Functional Prognosis: excellent    RECOMMENDATIONS: This patient's condition is appropriate for  continued rehabilitative care in the following setting: CIR Patient has agreed to participate in recommended program. Yes Note that insurance prior authorization may be required for reimbursement for recommended care.  Comment: admit to inpatient rehab today  Roy Staggers, MD, [place] [facility] 07/28/2014   Nanetta Batty, PT 07/28/2014      Revision History     Date/Time User Provider Type Action   07/28/2014 12:46 PM Roy Staggers, MD Physician Sign   07/28/2014 12:13 PM [facility] Re[facility] Admission Coordinator Share   View Details Report

## 2014-07-29 NOTE — Progress Notes (Signed)
INITIAL NUTRITION ASSESSMENT  DOCUMENTATION CODES Per approved criteria  -Not Applicable   INTERVENTION: Continue 30 ml Prostat po TID, each supplement provides 100 kcal and 15 grams of protein.  Provide Ensure Pudding po BID, each supplement provides 170 kcal and 4 grams of protein.  Provide Magic cup BID between meals, each supplement provides 290 kcal and 9 grams of protein.  Encourage adequate PO intake.  NUTRITION DIAGNOSIS: Inadequate PO intake related to decreased appetite as evidenced by meal completion of 50%.   Goal: Pt to meet >/= 90% of their estimated nutrition needs   Monitor:  PO intake, weight trends, labs, I/O's  Reason for Assessment: MST  81 y.o. male  Admitting Dx: Traumatic subdural hematoma with loss of consciousness of 6 hours to 24 hours  ASSESSMENT: Pt with history of DDD, HTN, vertigo, tinnitus,TVAR, oxygen dependent at nights, PAD who sustained a fall 2 days PTA on 06/26/14. CT head done revealing acute left parietal SDH.  Family present at pt bedside. Family reports pt's appetite has not been well. Meal completion is 50%. Family report PTA pt has eating great to no difficulties. Noted per Epic weight records, pt with a 6.4% weight loss in 1 month. Family report pt has been on a diet PTA as his doctor was recommending weight loss. Pt currently has Prostat ordered. Pt is agreeable to taking it. RD to order Ensure pudding and Magic cup between meals as po intake has been inadequate. Pt was encouraged to eat his food at meals and to take his supplements.  Nutrition Focused Physical Exam:  Subcutaneous Fat:  Orbital Region: N/A Upper Arm Region: WNL Thoracic and Lumbar Region: WNL  Muscle:  Temple Region: N/A Clavicle Bone Region: Moderate depletion Clavicle and Acromion Bone Region: Moderate depletion Scapular Bone Region: N/A Dorsal Hand: N/A Patellar Region: Moderate depletion Anterior Thigh Region: WNL Posterior Calf Region: Moderate  depletion  Edema: none  Labs: Low GFR. High potassium and CO2.  Height: Ht Readings from Last 1 Encounters:  07/28/14  (1.676 m)    Weight: Wt Readings from Last 1 Encounters:  07/28/14 173 lb 1 oz (78.5 kg)    Ideal Body Weight: 142 lbs  % Ideal Body Weight: 122%  Wt Readings from Last 10 Encounters:  07/28/14 173 lb 1 oz (78.5 kg)  07/08/14 185 lb 13.6 oz (84.3 kg)  04/30/14 197 lb 8 oz (89.585 kg)    Usual Body Weight: 200 lbs  % Usual Body Weight: 87%  BMI:  Body mass index is 27.95 kg/(m^2).  Estimated Nutritional Needs: Kcal: 1900-2050 Protein: 85-95 grams Fluid: 1.9 - 2 L/day  Skin: incision on head  Diet Order: DIET DYS 3 with nectar thick liquids  EDUCATION NEEDS: -No education needs identified at this time   Intake/Output Summary (Last 24 hours) at 07/29/14 1036 Last data filed at 07/29/14 0934  Gross per 24 hour  Intake    200 ml  Output    450 ml  Net   -250 ml    Last BM: 1/12  Labs:   Recent Labs Lab 07/29/14 0447  NA 135  K 5.2*  CL 99  CO2 33*  BUN 21  CREATININE 0.68  CALCIUM 8.7  GLUCOSE 88    CBG (last 3)   Recent Labs  07/28/14 1639 07/28/14 2025 07/29/14 0645  GLUCAP 143* 131* 92    Scheduled Meds: . budesonide (PULMICORT) nebulizer solution  0.5 mg Nebulization BID  . feeding supplement (PRO-STAT SUGAR FREE 64)  30 mL Oral TID WC  . finasteride  5 mg Oral QHS  . insulin aspart  0-5 Units Subcutaneous QHS  . insulin aspart  0-9 Units Subcutaneous TID WC  . levETIRAcetam  250 mg Oral BID  . metFORMIN  500 mg Oral BID WC  . metoprolol tartrate  25 mg Oral BID  . mirtazapine  15 mg Oral QHS  . modafinil  100 mg Oral Daily  . omega-3 acid ethyl esters  1 g Oral BID  . predniSONE  10 mg Oral Q breakfast   Followed by  . [START ON 08/03/2014] predniSONE  5 mg Oral Q breakfast  . tamsulosin  0.4 mg Oral QPC supper    Continuous Infusions:   Past Medical History  Diagnosis Date  .  Hypercholesterolemia   . Benign prostatic hypertrophy   . GERD (gastroesophageal reflux disease)   . DDD (degenerative disc disease), cervical   . Tinnitus   . Multinodular goiter   . Prediabetes   . DDD (degenerative disc disease), lumbar   . Hypertension   . Pulmonary nodules   . Anxiety disorder     Past Surgical History  Procedure Laterality Date  . Aortic valve replacement  01/2014  . History of blood transfusion    . Hernia repair      6 months old  . Craniotomy Left 06/26/2014    Procedure: Left Parietal Craniotomy for Subdural Hematoma;  Surgeon: Barnett AbuHenry Elsner, MD;  [facility];  Service: Neurosurgery;  Laterality: Left;    Marijean NiemannStephanie La, MS, RD, LDN Pager # [phone] After hours/ weekend pager # [phone]

## 2014-07-29 NOTE — Progress Notes (Signed)
[facility] PH[facility]ICAL MEDICINE & REHABILITATION     PROGRESS NOTE    Subjective/Complaints: Had a pretty uneventful night. Still with some cough but denies sob. No cp, pain controlled A  review of systems has been performed and if not noted above is otherwise negative.   Objective: Vital Signs: Blood pressure 150/77, pulse 75, temperature 97.9 F (36.6 C), temperature source Oral, resp. rate 18, height  (1.676 m), weight 78.5 kg (173 lb 1 oz), SpO2 96 %. No results found.  Recent Labs  07/27/14 0533 07/29/14 0447  WBC 8.6 8.6  HGB 11.7* 12.8*  HCT 36.5* 40.2  PLT 112* 126*    Recent Labs  07/29/14 0447  NA 135  K 5.2*  CL 99  GLUCOSE 88  BUN 21  CREATININE 0.68  CALCIUM 8.7   CBG (last 3)   Recent Labs  07/28/14 1639 07/28/14 2025 07/29/14 0645  GLUCAP 143* 131* 92    Wt Readings from Last 3 Encounters:  07/28/14 78.5 kg (173 lb 1 oz)  07/08/14 84.3 kg (185 lb 13.6 oz)  04/30/14 89.585 kg (197 lb 8 oz)    Physical Exam:  Constitutional: He is oriented to person, place, and time. He appears well-developed and well-nourished.  HENT:  Head: Normocephalic and atraumatic.  Well healed left crani incision.  Eyes: Conjunctivae are normal. Pupils are equal, round, and reactive to light.  Neck: Normal range of motion. Neck supple.  Cardiovascular: Normal rate and regular rhythm.  No murmur heard. Respiratory: Effort normal. He has occasional wheezes and scattered rhonchi.  Rales at bilateral bases.  GI: Soft. Bowel sounds are normal. He exhibits no distension. There is no tenderness.  Musculoskeletal: He exhibits no edema or tenderness.  Neurological: He is alert and oriented to person, place, and time.  Continued dysarthria and dysphonia with expressive deficits. Able to answer basic biographic questions. Needs occasional cues to follow simple one step commands and apraxia noted with MMT. UE grossly 4/5 prox to distal. LE: 2+ hf, 3 ke and 4/5  ankle dorsi and plantarflexion. Sensory exam grossly intact Skin: Skin is warm and dry. Some scattered ecchymoses. BLE  muscle wasting.  Psych: pt pleasant and generally appropriate  Assessment/Plan: 1. Functional deficits secondary to left SDH complicated by deconditioning from multiple medical issues which require 3+ hours per day of interdisciplinary therapy in a comprehensive inpatient rehab setting. Physiatrist is providing close team supervision and 24 hour management of active medical problems listed below. Physiatrist and rehab team continue to assess barriers to discharge/monitor patient progress toward functional and medical goals. FIM:                   Comprehension Comprehension Mode: Auditory Comprehension: 5-Follows basic conversation/direction: With extra time/assistive device  Expression Expression Mode: Verbal Expression: 3-Expresses basic 50 - 74% of the time/requires cueing 25 - 50% of the time. Needs to repeat parts of sentences.  Social Interaction Social Interaction: 6-Interacts appropriately with others with medication or extra time (anti-anxiety, antidepressant).  Problem Solving Problem Solving: 3-Solves basic 50 - 74% of the time/requires cueing 25 - 49% of the time  Memory Memory: 4-Recognizes or recalls 75 - 89% of the time/requires cueing 10 - 24% of the time Medical Problem List and Plan: 1. Functional deficits secondary to Left SDH complicated by acute respiratory failure 2. DVT Prophylaxis/Anticoagulation: Mechanical: Sequential compression devices, below knee Bilateral lower extremities 3. Pain Management: tylenol prn 4. Mood: Continue remeron at bedtime. LCSW to follow for  evaluation and support.  5. Neuropsych: This patient is not capable of making decisions on his own behalf. 6. Skin/Wound Care: Routine pressure relief measures.  7. Fluids/Electrolytes/Nutrition: Monitor I/O.    - Continue pro stat supplement tid. Offer ensure  pudding or magic cup between meals.  -follow up K+ later in week  -encourage PO  8. Hematuria: Has h/o BPH--sees Dr. Saddie Bendershao in [place]. Continue flomax and finasteride. Monitor voiding with PVR check. Monitor H/H.   -HGB trending up 9. Reactive airway disease: Was on nocturnal oxygen with nebs bid PTA.   -follow up cxr with bibasilar atelectasis.   -decreased prednisone to 10 mg and slowly taper prednisone to 5 mg daily per recommendations.   -IS, coughing, OOB 10. HTN: Will monitor BP every 8 hours. Continue metoprolol bid.  11. Seizure prophylaxis: Will continue Keppra bid 12. Acute respiratory failure: Continue budesonide nebs bid. Encourage IS every 4 hours while awake.   -oob, encouraged him to cough up secretions  LOS (Days) 1 A FACE TO FACE EVALUATION WAS PERFORMED  SWARTZ,ZACHARY T 07/29/2014 8:23 AM

## 2014-07-29 NOTE — Progress Notes (Signed)
Occupational Therapy Assessment and Plan  Patient Details  Name: Roy Mitchell MRN: [id number] Date of Birth: Dec 18, 1923  OT Diagnosis: abnormal posture, cognitive deficits and muscle weakness (generalized) Rehab Potential: Rehab Potential (ACUTE ONLY): Fair ELOS: 25 - 28 days   Today's Date: 07/29/2014 OT Individual Time: 0900-1000 OT Individual Time Calculation (min): 60 min     Problem List:  Patient Active Problem List   Diagnosis Date Noted  . Mild reactive airways disease 07/29/2014  . Dysphagia, pharyngoesophageal phase 07/29/2014  . DNR (do not resuscitate) 07/04/2014  . Lung collapse   . Acute respiratory failure with hypoxia 06/27/2014  . Acute respiratory failure   . Traumatic subdural hematoma with loss of consciousness of 6 hours to 24 hours 06/26/2014  . Abnormality of gait 06/26/2014  . HTN (hypertension) 06/26/2014  . Anxiety and depression 06/26/2014    Past Medical History:  Past Medical History  Diagnosis Date  . Hypercholesterolemia   . Benign prostatic hypertrophy   . GERD (gastroesophageal reflux disease)   . DDD (degenerative disc disease), cervical   . Tinnitus   . Multinodular goiter   . Prediabetes   . DDD (degenerative disc disease), lumbar   . Hypertension   . Pulmonary nodules   . Anxiety disorder    Past Surgical History:  Past Surgical History  Procedure Laterality Date  . Aortic valve replacement  01/2014  . History of blood transfusion    . Hernia repair      63 months old  . Craniotomy Left 06/26/2014    Procedure: Left Parietal Craniotomy for Subdural Hematoma;  Surgeon: Kristeen Miss, MD;  Location: [facility] NEURO ORS;  Service: Neurosurgery;  Laterality: Left;    Assessment & Plan Clinical Impression: Patient is a 81 y.o. male with history of DDD, HTN, vertigo, tinnitus,TVAR, oxygen dependent at nights, PAD who sustained a fall 2 days PTA on 06/26/14 with new onset of word finding difficulty as well as difficulty using eating  utensils. CT head done revealing acute left parietal SDH with 53mm thickness. Chronic plavix discontinued with plans for conservative care. Patient had decline in speech as well as right sided weakness later that day and was taken to OR by Dr. Ellene Route for left parietal craniotomy for evacuation of subdural hematoma. Post op intubated till 12/13 but has issues with hypoxia requiring NRB mask. CXR with total right lung collapse and required bronchoscopy 12/15 to help clear mucous plugging. He has transient improvement but developed lethargy with increase in oxygen needs on 12/18 requiring venturi mask. He was treated with IV antibiotics for mucopurulent bronchitis and was kept NPO due to dysphagia. NGT was placed for nutritional support.   He was transferred to [facility] and respiratory status has improved and he but continues to require supplemental oxygen. MBS was done on 12/29 and diet has slowly been advanced to regular with nectar liquids. He has had poor appetite and remeron was added to help with mood as well as appetite. He has been having problems with hematuria and has completed treatment for UTI. Difucan was added to help treat thrush. He is showing improvement in activity tolerance.  .  Patient transferred to CIR on 07/28/2014 .    Patient currently requires total with basic self-care skills secondary to muscle weakness, decreased cardiorespiratoy endurance, decreased initiation, decreased attention, decreased awareness, decreased problem solving, decreased safety awareness and decreased memory, possible vertigo and decreased sitting balance, decreased standing balance, decreased postural control and decreased balance strategies.  Prior to hospitalization,  patient could complete ADLs with modified independent .  Patient will benefit from skilled intervention to decrease level of assist with basic self-care skills prior to discharge home with care partner.  Anticipate patient will require minimal  physical assistance and follow up home health.      Skilled Therapeutic Intervention Upon entering the room, pt seated in wheelchair with wife present. PT finishing initial evaluation in which pt transitioned easily but with fatigue. OT educated pt on OT POC, OT purpose, and OT goals within inpatient rehabilitation venue. Patient and wife report understanding but education to continue. Pt engaged in B & D session while seated in wheelchair at sink side. Pt required mod - max verbal cues for initiation of tasks and safety awareness. Pt oriented to self, location, and situation but reporting "1966" as the year. Pt movements very slow during session and required increased time to complete tasks. Pt requiring Max A - total A of 1  for STS x 4 reps for LB bathing and clothing management with pt able to stand for less and less time each time standing (most time standing ~ 45 seconds). Second helper required for clothing management while therapist stood with pt. RN present to assess skin integrity during this time. Shoulder elevation increases pain during UB dressing as pt reports soreness from fall. Pt fully dressed and seated in wheelchair with call bell and wife remaining present upon exiting the room.   OT Evaluation Precautions/Restrictions  Precautions Precautions: Fall Restrictions Weight Bearing Restrictions: No General Chart Reviewed: Yes Vital Signs Oxygen Therapy SpO2: 96 % O2 Device: Nasal Cannula O2 Flow Rate (L/min): 2 L/min Pain Pain Assessment Pain Assessment: No/denies pain  Home Living/Prior Functioning Home Living Family/patient expects to be discharged to:: Private residence Living Arrangements: Spouse/significant other Available Help at Discharge: Family Type of Home: House Home Access: Ramped entrance Home Layout: One level Additional Comments: unsure if bathroom accessible via wheelchair  Lives With: Spouse Prior Function Level of Independence: Requires assistive  device for independence, Independent with transfers Vocation: Retired Comments: was walking in neighborhood with use of a cane Vision/Perception  Vision- History Baseline Vision/History: Wears glasses Wears Glasses: Reading only Patient Visual Report: No change from baseline Vision- Assessment Vision Assessment?: No apparent visual deficits  Cognition Overall Cognitive Status: Impaired/Different from baseline Arousal/Alertness: Awake/alert Orientation Level: Oriented to person;Oriented to place;Oriented to situation;Disoriented to time Attention: Selective Selective Attention: Appears intact Memory: Impaired Memory Impairment: Storage deficit;Retrieval deficit;Decreased recall of new information Awareness: Impaired Awareness Impairment: Intellectual impairment;Emergent impairment Problem Solving: Impaired Problem Solving Impairment: Functional basic;Verbal basic Executive Function: Decision Making;Reasoning Reasoning: Impaired Reasoning Impairment: Verbal basic Decision Making: Impaired Decision Making Impairment: Verbal basic;Functional basic Safety/Judgment: Impaired Comments: decreased intellectual/emergent awareness of deficits  Sensation Sensation Light Touch: Appears Intact Stereognosis: Not tested Hot/Cold: Appears Intact Proprioception: Appears Intact Coordination Gross Motor Movements are Fluid and Coordinated: No Fine Motor Movements are Fluid and Coordinated: No Motor  Motor Motor: Hemiplegia Motor - Skilled Clinical Observations: generalized weakness Mobility  Bed Mobility Bed Mobility: Rolling Left;Left Sidelying to Sit Rolling Left: 4: Min assist Rolling Left Details: Verbal cues for technique Rolling Left Details (indicate cue type and reason): cues for initiation RUE and RLE Left Sidelying to Sit: 2: Max assist Left Sidelying to Sit Details: Manual facilitation for weight bearing;Manual facilitation for weight shifting;Verbal cues for  technique Transfers Transfers: Sit to Stand Sit to Stand: With armrests;2: Max assist Sit to Stand Details: Verbal cues for technique;Verbal cues for sequencing;Manual facilitation for  placement  Trunk/Postural Assessment  Cervical Assessment Cervical Assessment: Exceptions to [facility] (flexed laterally to R) Thoracic Assessment Thoracic Assessment: Exceptions to [facility] (kyphotic) Lumbar Assessment Lumbar Assessment: Exceptions to [facility] (posterior pelvic tilit) Postural Control Postural Control: Deficits on evaluation Righting Reactions: lateral lean to R - difficulty with midline orientation. Protective Responses: none; delayed perception of LOB backwards  Balance Balance Balance Assessed: Yes Static Sitting Balance Static Sitting - Level of Assistance: 1: +1 Total assist Dynamic Sitting Balance Dynamic Sitting - Level of Assistance: 1: +1 Total assist Static Standing Balance Static Standing - Level of Assistance: 1: +2 Total assist Extremity/Trunk Assessment RUE Assessment RUE Assessment: Exceptions to [facility] RUE AROM (degrees) Overall AROM Right Upper Extremity: Deficits;Due to pain RUE PROM (degrees) Overall PROM Right Upper Extremity: Deficits;Due to pain RUE Strength RUE Overall Strength Comments: deficits secondary to pain  2+/5 shoulder, 3+/5 for elbow,wrist, and hand LUE Assessment LUE Assessment: Exceptions to [facility] LUE AROM (degrees) Overall AROM Left Upper Extremity: Due to pain;Deficits LUE PROM (degrees) Overall PROM Left Upper Extremity: Deficits;Due to pain LUE Strength LUE Overall Strength Comments: decreased strength secondary to pain as pt reports he is sore from fall. Shoulder 3-/5, and 3+/5 for elbow,wrist, and hand. L UE stronger than R UE.  FIM:  FIM - Grooming Grooming Steps: Wash, rinse, dry face;Wash, rinse, dry hands Grooming: 5: Set-up assist to obtain items FIM - Bathing Bathing Steps Patient Completed: Chest;Right Arm;Left Arm;Abdomen Bathing: 1: Two  helpers FIM - Upper Body Dressing/Undressing Upper body dressing/undressing steps patient completed: Thread/unthread right sleeve of pullover shirt/dresss;Thread/unthread left sleeve of pullover shirt/dress Upper body dressing/undressing: 3: Mod-Patient completed 50-74% of tasks FIM - Lower Body Dressing/Undressing Lower body dressing/undressing: 1: Two helpers FIM - Control and instrumentation engineer Devices: HOB elevated;Bed rails Bed/Chair Transfer: 2: Supine > Sit: Max A (lifting assist/Pt. 25-49%);1: Two helpers FIM - Camera operator Transfers: 0-Activity did not occur or was simulated   Refer to Care Plan for Long Term Goals  Recommendations for other services: Neuropsych  Discharge Criteria: Patient will be discharged from OT if patient refuses treatment 3 consecutive times without medical reason, if treatment goals not met, if there is a change in medical status, if patient makes no progress towards goals or if patient is discharged from hospital.  The above assessment, treatment plan, treatment alternatives and goals were discussed and mutually agreed upon: by patient and by family  Phineas Semen 07/29/2014, 11:35 AM

## 2014-07-29 NOTE — Evaluation (Addendum)
Physical Therapy Assessment and Plan  Patient Details  Name: Roy Mitchell MRN: [id number] Date of Birth: [birth year]  PT Diagnosis: Abnormal posture, Abnormality of gait, Hemiparesis dominant, Muscle weakness and Vertigo Rehab Potential: Good ELOS: 24-27 days   Today's Date: 07/29/2014 PT Individual [id number], [id number] PT Individual Time Calculation (min): 45 min, 35 min    Problem List:  Patient Active Problem List   Diagnosis Date Noted  . Mild reactive airways disease 07/29/2014  . Dysphagia, pharyngoesophageal phase 07/29/2014  . DNR (do not resuscitate) 07/04/2014  . Lung collapse   . Acute respiratory failure with hypoxia 06/27/2014  . Acute respiratory failure   . Traumatic subdural hematoma with loss of consciousness of 6 hours to 24 hours 06/26/2014  . Abnormality of gait 06/26/2014  . HTN (hypertension) 06/26/2014  . Anxiety and depression 06/26/2014    Past Medical History:  Past Medical History  Diagnosis Date  . Hypercholesterolemia   . Benign prostatic hypertrophy   . GERD (gastroesophageal reflux disease)   . DDD (degenerative disc disease), cervical   . Tinnitus   . Multinodular goiter   . Prediabetes   . DDD (degenerative disc disease), lumbar   . Hypertension   . Pulmonary nodules   . Anxiety disorder    Past Surgical History:  Past Surgical History  Procedure Laterality Date  . Aortic valve replacement  01/2014  . History of blood transfusion    . Hernia repair      45 months old  . Craniotomy Left 06/26/2014    Procedure: Left Parietal Craniotomy for Subdural Hematoma;  Surgeon: Kristeen Miss, MD;  Location: [facility] NEURO ORS;  Service: Neurosurgery;  Laterality: Left;    Assessment & Plan Clinical Impression:  Roy Mitchell is a 81 y.o. male with history of DDD, HTN, vertigo, tinnitus,TVAR, oxygen dependent at nights, PAD who sustained a fall 2 days PTA on 06/26/14 with new onset of word finding difficulty as well as difficulty using eating  utensils. CT head done revealing acute left parietal SDH with 62m thickness. Chronic plavix discontinued with plans for conservative care. Patient had decline in speech as well as right sided weakness later that day and was taken to OR by Dr. EEllene Routefor left parietal craniotomy for evacuation of subdural hematoma. Post op intubated till 12/13 but has issues with hypoxia requiring NRB mask. CXR with total right lung collapse and required bronchoscopy 12/15 to help clear mucous plugging. He has transient improvement but developed lethargy with increase in oxygen needs on 12/18 requiring venturi mask. He was treated with IV antibiotics for mucopurulent bronchitis and was kept NPO due to dysphagia. NGT was placed for nutritional support.   [facility] but continues to require supplemental oxygen. MBS was done on 12/29 and diet has slowly been advanced to regular with nectar liquids. Patient transferred to CIR on 07/28/2014 .   Patient currently requires total assistance with mobility secondary to muscle weakness, decreased cardiorespiratoy endurance and decreased oxygen support, impaired timing and sequencing, unbalanced muscle activation and decreased motor planning and vertigo (chronic per family).  Prior to hospitalization, patient was modified independent  with mobility and lived with Spouse in a House home.  Home access is  Ramped entrance.  Patient will benefit from skilled PT intervention to maximize safe functional mobility, minimize fall risk and decrease caregiver burden for planned discharge home with 24 hour assist.  Anticipate patient will [facility]  [facility].  PT - End of Session Activity Tolerance: Tolerates < 10 min activity with changes in vital signs Endurance Deficit: Yes Endurance Deficit Description: DOE of sitting up PT Assessment Rehab Potential (ACUTE/IP ONLY): Good Barriers to Discharge: Decreased  caregiver support PT Patient demonstrates impairments in the following area(s): Balance;Motor;Endurance PT Transfers Functional Problem(s): Bed Mobility;Bed to Chair;Car;Furniture PT Locomotion Functional Problem(s): Stairs;Wheelchair Mobility;Ambulation PT Plan PT Intensity: Minimum of 1-2 x/day ,45 to 90 minutes PT Frequency: 5 out of 7 days PT Duration Estimated Length of Stay: 24-27 days PT Treatment/Interventions: Ambulation/gait training;Balance/vestibular training;Cognitive remediation/compensation;Discharge planning;Community reintegration;DME/adaptive equipment instruction;Functional electrical stimulation;Functional mobility training;Patient/family education;Neuromuscular re-education;Psychosocial support;Splinting/orthotics;Therapeutic Exercise;Therapeutic Activities;Stair training;UE/LE Strength taining/ROM;UE/LE Coordination activities;Wheelchair propulsion/positioning PT Transfers Anticipated Outcome(s): min assist basic and car PT Locomotion Anticipated Outcome(s): min assist gait x 150' in controlled environment, supervision w/c x 150' controlled and home environments PT Recommendation Recommendations for Other Services: Vestibular eval (PRN for vertigo acute and chronic) Follow Up Recommendations: Home health PT Patient destination: Home Equipment Recommended: To be determined  Skilled Therapeutic Intervention Tx 1:Pt extremely slow eating breakfast, per NT.  Pt missed 15 minutes at start of session due to breakfast.  Eval completed.  Discussed ELOS with pt and wife, fall risks, use of call bell.  Pt left sitting up in w/c for next therapy immediately following.  Family present.  Tx 2:  Pt much brighter and responsive this PM.  Gait in // +2 x 2'.  Pt able to stand by pulling up on // with bil hands.  Activity increased coughing; congested but non productive.  Activity tolerance limited his distance. Returned to room; +2 squat pivot transfer w/c> bed to L. Positioned comfortably  in supine; all needs left within reach; bed alarm set.  Family present.  PT Evaluation Precautions/Restrictions- falls   General   Vital SignsTherapy Vitals Pulse Rate: 89 Patient Position (if appropriate): Sitting Oxygen Therapy SpO2: 97 % O2 Device: Nasal Cannula O2 Flow Rate (L/min): 2 L/min Pain Pain Assessment Pain Assessment: No/denies pain Home Living/Prior Functioning Home Living Living Arrangements: Spouse/significant other Available Help at Discharge: Family Type of Home: House Home Access: Ramped entrance Home Layout: One level Additional Comments: unsure if bathroom accessible via wheelchair  Lives With: Spouse Prior Function Level of Independence: Requires assistive device for independence;Independent with transfers Vocation: Retired Comments: was walking in neighborhood with use of a cane Vision/Perception -pt reports no change in vision    Cognition Overall Cognitive Status: Impaired/Different from baseline Arousal/Alertness: Awake/alert Orientation Level: Disoriented to time;Oriented to place;Oriented to person;Oriented to situation Attention: Focused Selective Attention: Appears intact Memory: Impaired Memory Impairment: Storage deficit;Retrieval deficit;Decreased recall of new information Awareness: Impaired Awareness Impairment: Intellectual impairment;Emergent impairment Problem Solving: Impaired Problem Solving Impairment: Functional basic;Verbal basic Executive Function: Decision Making;Reasoning Reasoning: Impaired Reasoning Impairment: Verbal basic Decision Making: Impaired Decision Making Impairment: Verbal basic;Functional basic Safety/Judgment: Impaired Comments: decreased intellectual/emergent awareness of deficits  Sensation Sensation Light Touch: Appears Intact Stereognosis: Not tested Hot/Cold: Appears Intact Proprioception: Appears Intact (difficult to assess due to dysarthria; bil ankles) Coordination Gross Motor Movements are  Fluid and Coordinated: No Fine Motor Movements are Fluid and Coordinated: No Finger Nose Finger Test: impaired bil Motor  Motor Motor: Hemiplegia Motor - Skilled Clinical Observations: generalized weakness  Mobility Bed Mobility Bed Mobility: Rolling Left;Left Sidelying to Sit Rolling Left: 4: Min assist Rolling Left Details: Verbal cues for technique Locomotion  Ambulation Ambulation: No Gait Gait: No, in AM eval; unable to step x 2 Attempts with 2  PTs;  R shoulder painful if placed over therapist shoulder  Stairs / Additional Locomotion Stairs: No Wheelchair Mobility Wheelchair Mobility: Yes Wheelchair Assistance: 5: Careers information officer: Both upper extremities Wheelchair Parts Management: Needs assistance  Trunk/Postural Assessment  Cervical Assessment Cervical Assessment: Exceptions to [facility] (flexed laterally to R; family reported that pt held his head to R due to chronic cervical problems) Thoracic Assessment Thoracic Assessment: Exceptions to [facility] (kyphotic) Lumbar Assessment Lumbar Assessment: Exceptions to [facility] (posterior pelvic tilit) Postural Control Postural Control: Deficits on evaluation Righting Reactions: lateral lean to R - difficulty with midline orientation.  Balance Balance Balance Assessed: Yes Static Sitting Balance Static Sitting - Level of Assistance: 1: +1 Total assist (progressed after several minutes to close supervision) Dynamic Sitting Balance Dynamic Sitting - Level of Assistance: 1: +1 Total assist Sitting balance - Comments: placing hands from bed into lap Static Standing Balance Static Standing - Level of Assistance: 1: +2 Total assist Dynamic Standing Balance Dynamic Standing - Level of Assistance: 1: +2 Total assist (wt shifting only) Extremity Assessment  RUE Assessment RUE Assessment: Exceptions to [facility] RUE AROM (degrees) Overall AROM Right Upper Extremity: Deficits;Due to pain RUE PROM (degrees) Overall PROM Right  Upper Extremity: Deficits;Due to pain RUE Strength RUE Overall Strength Comments: deficits secondary to pain  2+/5 shoulder, 3+/5 for elbow,wrist, and hand LUE Assessment LUE Assessment: Exceptions to [facility] LUE AROM (degrees) Overall AROM Left Upper Extremity: Due to pain;Deficits LUE PROM (degrees) Overall PROM Left Upper Extremity: Deficits;Due to pain LUE Strength LUE Overall Strength Comments: decreased strength secondary to pain as pt reports he is sore from fall. Shoulder 3-/5, and 3+/5 for elbow,wrist, and hand. L UE stronger than R UE. RLE Assessment RLE Assessment: Exceptions to [facility] RLE Strength RLE Overall Strength Comments: grossly in sitting: 4-/5 hip flex/add/abd and ankle DF; 4/5 knee flex/ext LLE Assessment LLE Assessment: Exceptions to [facility] LLE Strength LLE Overall Strength Comments: grossly in sitting 4/5 hip flex/add/abd, 4+knee flex/ext and ankle Df  FIM:  FIM - Bed/Chair Transfer Bed/Chair Transfer Assistive Devices: HOB elevated;Bed rails Bed/Chair Transfer: 2: Supine > Sit: Max A (lifting assist/Pt. 25-49%);1: Two helpers FIM - Locomotion: Glass blower/designer: Wheelchair: 1: Travels less than 50 ft with supervision, cueing or coaxing FIM - Locomotion: Ambulation Locomotion: Ambulation: 0: Activity did not occur FIM - Locomotion: Stairs Locomotion: Stairs: 0: Activity did not occur   Refer to Care Plan for Long Term Goals  Recommendations for other services: None  Discharge Criteria: Patient will be discharged from PT if patient refuses treatment 3 consecutive times without medical reason, if treatment goals not met, if there is a change in medical status, if patient makes no progress towards goals or if patient is discharged from hospital.  The above assessment, treatment plan, treatment alternatives and goals were discussed and mutually agreed upon: by patient and by family  Javanni Maring 07/29/2014, 5:33 PM

## 2014-07-29 NOTE — Evaluation (Addendum)
Speech Language Pathology Assessment and Plan  Patient Details  Name: Roy Mitchell MRN: [id number] Date of Birth: [birth year]  SLP Diagnosis: Dysarthria;Cognitive Impairments;Speech and Language deficits  Rehab Potential: Good ELOS: 21-28 days     Today's Date: 07/29/2014 SLP Individual Time: 1300-1410 SLP Individual Time Calculation (min): 70 min   Problem List:  Patient Active Problem List   Diagnosis Date Noted  . Mild reactive airways disease 07/29/2014  . Dysphagia, pharyngoesophageal phase 07/29/2014  . DNR (do not resuscitate) 07/04/2014  . Lung collapse   . Acute respiratory failure with hypoxia 06/27/2014  . Acute respiratory failure   . Traumatic subdural hematoma with loss of consciousness of 6 hours to 24 hours 06/26/2014  . Abnormality of gait 06/26/2014  . HTN (hypertension) 06/26/2014  . Anxiety and depression 06/26/2014   Past Medical History:  Past Medical History  Diagnosis Date  . Hypercholesterolemia   . Benign prostatic hypertrophy   . GERD (gastroesophageal reflux disease)   . DDD (degenerative disc disease), cervical   . Tinnitus   . Multinodular goiter   . Prediabetes   . DDD (degenerative disc disease), lumbar   . Hypertension   . Pulmonary nodules   . Anxiety disorder    Past Surgical History:  Past Surgical History  Procedure Laterality Date  . Aortic valve replacement  01/2014  . History of blood transfusion    . Hernia repair      61 months old  . Craniotomy Left 06/26/2014    Procedure: Left Parietal Craniotomy for Subdural Hematoma;  Surgeon: Kristeen Miss, MD;  Location: [facility] NEURO ORS;  Service: Neurosurgery;  Laterality: Left;    Assessment / Plan / Recommendation Clinical Impression  Roy Mitchell is a 81 y.o. male with history of DDD, HTN, vertigo, tinnitus,TVAR, oxygen dependent at nights, PAD who sustained a fall 2 days PTA on 06/26/14 with new onset of word finding difficulty as well as difficulty using eating utensils. CT  head done revealing acute left parietal SDH with 84mm thickness. Patient had decline in speech as well as right sided weakness later that day and was taken to OR by Dr. Ellene Route for left parietal craniotomy for evacuation of subdural hematoma. Post op intubated until 12/13 but has issues with hypoxia requiring NRB mask. CXR with total right lung collapse and required bronchoscopy 12/15 to help clear mucous plugging. He has transient improvement but developed lethargy with increase in oxygen needs on 12/18 requiring venturi mask. He was treated with IV antibiotics for mucopurulent bronchitis and was kept NPO due to dysphagia. NGT was placed for nutritional support. He was transferred to [facility] and respiratory status has improved but he continues to require supplemental oxygen. MBS  done on 12/29 and diet has slowly been advanced to dys 3 with nectar thick liquids. Pt admitted to CIR on 07/28/2014.  SLP evaluation was completed on 07/29/2014 with the following results:  Pt presents with s/s of a moderately severe oropharyngeal dysphagia characterized by right sided labial, lingual and buccal weakness, which resulted in prolonged mastication and posterior transit of dys 3 solids.  Pt was also noted with mild residual solids left in the oral cavity post swallow which cleared with alternating solid and liquid consistencies.  Pt also presented with suspected delayed swallow initiation, which was also noted on report of pt's MBS from [facility].  Suspected delayed swallow initiation resulted in immediate cough x3 with nectar thick liquids. No further s/s of aspiration noted at bedside.  Recommend that pt  remain on Dys 3 solids with nectar thick liquids with full supervision for use of swallowing precautions.   Additionally, pt presents with a moderately severe flaccid dysarthria characterized by decreased vocal intensity with imprecise articulation of consonants resulting from right sided oral motor weakness.  Furthermore, pt  presents with moderately severe word finding deficits at the phrase levels characterized by groping for words and circumlocutions.  Informally, pt presents with mild-moderate cognitive impairments characterized by decreased intellectual/emergent awareness of deficits, decreased recall of daily information, and decreased functional problem solving for basic tasks.  Pt would benefit from skilled ST while inpatient in order to maximize functional independence and reduce burden of care prior to discharge.  Anticipate that pt will require 24/7 supervision, assistance for medication and financial management, and skilled ST follow up in either the home health or outpatient setting at discharge.    Skilled Therapeutic Interventions          Cognitive-linguistic and bedside swallowing evaluation completed with results and recommendations reviewed with patient and family.  Pt's wife was educated extensively regarding rationale behind recommended swallowing precautions and she returned demonstration of compensatory strategies to cue pt for rate and portion control and intermittent throat clear with wet vocal quality.  Wife, Roy Mitchell, cleared to supervise pt during meals    SLP Assessment  Patient will need skilled Speech Lanaguage Pathology Services during CIR admission    Recommendations  Diet Recommendations: Dysphagia 3 (Mechanical Soft);Nectar-thick liquid Liquid Administration via: Cup;No straw Medication Administration: Crushed with puree Supervision: Full supervision/cueing for compensatory strategies;Patient able to self feed Compensations: Slow rate;Small sips/bites;Check for anterior loss;Check for pocketing;Multiple dry swallows after each bite/sip;Follow solids with liquid;Clear throat intermittently Postural Changes and/or Swallow Maneuvers: Out of bed for meals;Seated upright 90 degrees;Upright 30-60 min after meal Oral Care Recommendations: Oral care BID Patient destination: Home Follow up  Recommendations: Home Health SLP;24 hour supervision/assistance Equipment Recommended: None recommended by SLP    SLP Frequency 5 out of 7 days   SLP Treatment/Interventions Cognitive remediation/compensation;Cueing hierarchy;Dysphagia/aspiration precaution training;Functional tasks;Patient/family education;Therapeutic Activities;Speech/Language facilitation;Oral motor exercises;Multimodal communication approach;Internal/external aids;Environmental controls    Pain Pain Assessment Pain Assessment: No/denies pain Prior Functioning Cognitive/Linguistic Baseline: Within functional limits Type of Home: House  Lives With: Spouse Available Help at Discharge: Family Vocation: Retired  Industrial/product designer Term Goals: Week 1: SLP Short Term Goal 1 (Week 1): Pt will tolerate presentations of his currently prescribed diet with no overt s/s of aspiration with min cues for use of swallowing precautions.   SLP Short Term Goal 2 (Week 1): Pt will tolerate trials of advanced consistencies with no overt s/s of aspiration and min cues for use of swallowing precautions.   SLP Short Term Goal 3 (Week 1): Pt will improve speech intelligibility at the phrase level to ~75% accuracy with mod cues for use of dysarthria strategies.   SLP Short Term Goal 4 (Week 1): Pt will improve functional word finding at the phrase level over 75% of observable opportunities with mod cues for use of compensatory strategies.  SLP Short Term Goal 5 (Week 1): Pt will improve recall of daily information over 75% of observable opportunities with mod cues for use of external aids.   SLP Short Term Goal 6 (Week 1): Pt will improve functional problem solving during basic, structured, therapeutic tasks over 75% of observable opportunities with mod cues.    See FIM for current functional status Refer to Care Plan for Long Term Goals  Recommendations for other services: None  Discharge Criteria:  Patient will be discharged from SLP if patient refuses  treatment 3 consecutive times without medical reason, if treatment goals not met, if there is a change in medical status, if patient makes no progress towards goals or if patient is discharged from hospital.  The above assessment, treatment plan, treatment alternatives and goals were discussed and mutually agreed upon: by patient and by family  Emilio Math 07/29/2014, 4:03 PM

## 2014-07-30 ENCOUNTER — Inpatient Hospital Stay (HOSPITAL_COMMUNITY): Payer: Medicare Other

## 2014-07-30 ENCOUNTER — Inpatient Hospital Stay (HOSPITAL_COMMUNITY): Payer: Medicare Other | Admitting: Speech Pathology

## 2014-07-30 DIAGNOSIS — J189 Pneumonia, unspecified organism: Secondary | ICD-10-CM

## 2014-07-30 LAB — GLUCOSE, CAPILLARY
GLUCOSE-CAPILLARY: 130 mg/dL — AB (ref 70–99)
Glucose-Capillary: 102 mg/dL — ABNORMAL HIGH (ref 70–99)
Glucose-Capillary: 118 mg/dL — ABNORMAL HIGH (ref 70–99)
Glucose-Capillary: 109 mg/dL — ABNORMAL HIGH (ref 70–99)

## 2014-07-30 LAB — BASIC METABOLIC PANEL
Anion gap: 4 — ABNORMAL LOW (ref 5–15)
BUN: 20 mg/dL (ref 6–23)
CHLORIDE: 99 meq/L (ref 96–112)
CO2: 32 mmol/L (ref 19–32)
CREATININE: 0.88 mg/dL (ref 0.50–1.35)
Calcium: 9 mg/dL (ref 8.4–10.5)
GFR calc Af Amer: 86 mL/min — ABNORMAL LOW (ref >90)
GFR, EST NON AFRICAN AMERICAN: 74 mL/min — AB (ref >90)
GLUCOSE: 148 mg/dL — AB (ref 70–99)
Potassium: 4.8 mmol/L (ref 3.5–5.1)
Sodium: 135 mmol/L (ref 135–145)

## 2014-07-30 LAB — URINE CULTURE: Colony Count: 4000

## 2014-07-30 MED ORDER — SODIUM CHLORIDE 0.9 % IV SOLN
INTRAVENOUS | Status: DC
  Administered 2014-07-30: 18:00:00 via INTRAVENOUS
  Filled 2014-07-30: qty 1000

## 2014-07-30 MED ORDER — ACETYLCYSTEINE 20 % IN SOLN
2.0000 mL | Freq: Three times a day (TID) | RESPIRATORY_TRACT | Status: DC
  Filled 2014-07-30 (×4): qty 4

## 2014-07-30 MED ORDER — LEVALBUTEROL HCL 0.63 MG/3ML IN NEBU
0.6300 mg | INHALATION_SOLUTION | Freq: Two times a day (BID) | RESPIRATORY_TRACT | Status: DC | PRN
  Administered 2014-07-31 – 2014-08-05 (×10): 0.63 mg via RESPIRATORY_TRACT
  Filled 2014-07-30 (×11): qty 3

## 2014-07-30 MED ORDER — ACETYLCYSTEINE 20 % IN SOLN
2.0000 mL | Freq: Two times a day (BID) | RESPIRATORY_TRACT | Status: DC
  Administered 2014-07-30 (×2): 2 mL via RESPIRATORY_TRACT
  Administered 2014-07-31: 09:00:00 via RESPIRATORY_TRACT
  Administered 2014-07-31: 2 mL via RESPIRATORY_TRACT
  Administered 2014-08-01: 08:00:00 via RESPIRATORY_TRACT
  Administered 2014-08-01 – 2014-08-02 (×2): 2 mL via RESPIRATORY_TRACT
  Administered 2014-08-02 – 2014-08-03 (×2): via RESPIRATORY_TRACT
  Administered 2014-08-03 – 2014-08-05 (×4): 2 mL via RESPIRATORY_TRACT
  Filled 2014-07-30 (×15): qty 4

## 2014-07-30 NOTE — Progress Notes (Signed)
Physical Therapy Session Note  Patient Details  Name: Roy Mitchell MRN: [id number] Date of Birth: 1924-02-14  Today's Date: 07/30/2014 PT Individual Time: 1400-1500 PT Individual Time Calculation (min): 60 min   Short Term Goals: Week 1:  PT Short Term Goal 1 (Week 1): pt will perform supine> sit with mod assist  PT Short Term Goal 2 (Week 1): pt will perform basic transfer with assistance from 1 person PT Short Term Goal 3 (Week 1): pt will propel w/c x 50' with supervision PT Short Term Goal 4 (Week 1): pt will perform gait x 20' with assistance of 2 people PT Short Term Goal 5 (Week 1): pt will negotiate 1 step with assistance of 1 person  Skilled Therapeutic Interventions/Progress Updates:  1:1. Pt received semi-reclined in bed, reported feeling fatigued but agreeable to participate in therapy as able. Pt denied pain. Focus this session on activity tolerance, functional w/c propulsion and transfers, posture, breathing and standing endurance. Pt req supervision for w/c propulsion 75'x2 with B UE, demonstrating very slow but steady pace with min cues for obstacle negotiation and propulsion technique. Pt able to t/f sup>sit EOB with min A and use of bed rail. Pt req min-mod Ax2 persons for squat pivot t/f bed>w/c>tx mat. Pt practiced multiple t/f sit<>stand this session with RW from non-elevated mat for emphasis on hand placement, anterior weight shift and B LE strength, req mod Ax1person with +2 present for safety. Pt req consistent cues for completion of trunk/hip extension. Progression to marching in place and SPT tx mat<>w/c x3 reps with overall mod Ax1person and +2 present for safety. Pt consistently attempting to sit prematurely due to fatigue. Due to decreased standing tolerance, pt unable to safely negotiate up/down single step at this time. Pt with overall poor tolerance to tapping bottom step 1x with B LE due to decreased standing endurance and B LE strength, req mod A x1person, +2  present for safety.   Pt unable to maintain standing balance >10 seconds, consistently cued for breathing technique and improved posture in both sitting and standing due to SOB.   Pt able to maintain SaO2 >95% on 2L throughout session. Pt left semi-reclined in bed at end of session w/ all needs in reach, bed alarm on, nurse tech and family in room.    Therapy Documentation Precautions:  Precautions Precautions: Fall Restrictions Weight Bearing Restrictions: No   Vital Signs: Therapy Vitals Temp: 97.6 F (36.4 C) Temp Source: Oral Pulse Rate: 88 Resp: 18 BP: 119/74 mmHg Patient Position (if appropriate): Lying Oxygen Therapy SpO2: 96 % O2 Device: Nasal Cannula O2 Flow Rate (L/min): 2 L/min Pulse Oximetry Type: Intermittent Pain: Pain Assessment Pain Assessment: No/denies pain    See FIM for current functional status  Therapy/Group: Individual Therapy  Denzil HughesKing, [name] S 07/30/2014, 4:14 PM

## 2014-07-30 NOTE — Progress Notes (Signed)
Speech Language Pathology Daily Session Note  Patient Details  Name: Roy Mitchell MRN: [id number] Date of Birth: 07-31-1923  Today's Date: 07/30/2014 SLP Individual Time: 0800-0900 SLP Individual Time Calculation (min): 60 min  Short Term Goals: Week 1: SLP Short Term Goal 1 (Week 1): Pt will tolerate presentations of his currently prescribed diet with no overt s/s of aspiration with min cues for use of swallowing precautions.   SLP Short Term Goal 2 (Week 1): Pt will tolerate trials of advanced consistencies with no overt s/s of aspiration and min cues for use of swallowing precautions.   SLP Short Term Goal 3 (Week 1): Pt will improve speech intelligibility at the phrase level to ~75% accuracy with mod cues for use of dysarthria strategies.   SLP Short Term Goal 4 (Week 1): Pt will improve functional word finding at the phrase level over 75% of observable opportunities with mod cues for use of compensatory strategies.  SLP Short Term Goal 5 (Week 1): Pt will improve recall of daily information over 75% of observable opportunities with mod cues for use of external aids.   SLP Short Term Goal 6 (Week 1): Pt will improve functional problem solving during basic, structured, therapeutic tasks over 75% of observable opportunities with mod cues.    Skilled Therapeutic Interventions: Skilled treatment session focused on dysphagia treatment and patient/family education.  SLP facilitated session by providing skilled observation of patient with breakfast meal of Dys. 3 textures and nectar-thick liquids.  Patient was Mod I for utilization of small bites/sips and a slow rate of self-feeding and demonstrated an intermittent wet vocal quality which required Max A multimodal cues for patient to self-monitor and correct with a throat clear.  Patient also demonstrated weak cough X 2, suspect due to oral and questionable pharyngeal residuals, multiple swallows appeared effective in eliminating coughing for  remainder of meal. Patient's wife and son-in-law present and provided education in regards to patient's current swallowing function and swallowing compensatory strategies.  All verbalized understanding and asked appropriate questions. Patient verbalized at the phrase level and was ~90% intelligible. Patient left in wheelchair with wife present. Continue with current plan of care.     FIM:  Comprehension Comprehension Mode: Auditory Comprehension: 5-Follows basic conversation/direction: With extra time/assistive device Expression Expression Mode: Verbal Expression: 3-Expresses basic 50 - 74% of the time/requires cueing 25 - 50% of the time. Needs to repeat parts of sentences. Social Interaction Social Interaction: 5-Interacts appropriately 90% of the time - Needs monitoring or encouragement for participation or interaction. Problem Solving Problem Solving: 3-Solves basic 50 - 74% of the time/requires cueing 25 - 49% of the time Memory Memory: 3-Recognizes or recalls 50 - 74% of the time/requires cueing 25 - 49% of the time FIM - Eating Eating Activity: 5: Set-up assist for open containers;5: Supervision/cues  Pain Pain Assessment Pain Assessment: No/denies pain  Therapy/Group: Individual Therapy  [place] 07/30/2014, 2:20 PM  Feliberto Gottronourtney Davaughn Hillyard, MA, CCC-SLP [phone]

## 2014-07-30 NOTE — Progress Notes (Signed)
Occupational Therapy Session Note  Patient Details  Name: Roy Mitchell MRN: [id number] Date of Birth: 02-17-1924  Today's Date: 07/30/2014 OT Individual Time: 1030-1100 OT Individual Time Calculation (min): 30 min    Short Term Goals: Week 1:  OT Short Term Goal 1 (Week 1): Pt will peform bathing with Max A of 1 person in order to decrease assistance for self care task. OT Short Term Goal 2 (Week 1): Pt will perform LB dressing with Max A of 1 person for clothing management. OT Short Term Goal 3 (Week 1): Pt will perform UB dressing with Min A in order to increase independence in self care. OT Short Term Goal 4 (Week 1): Pt will decrease to min verbal cues for initiation of self care tasks in order to decrease cues required for functional tasks.   Skilled Therapeutic Interventions/Progress Updates:    Pt engaged in BADL retraining including bathing and dressing with sit<>stand from w/c at sink.  Pt performed sit<>stand X 4 (tot A +2-pt=75% X 2 and min A + 1 X 2). Pt fatigues quickly when standing and requires multiple rest breaks throughout session.  Pt requires extra time to complete tasks.  Therapist provided increased assistance this morning secondary to limited time available.  Pt O2 sats >90% throughout session on room air.  Focus on activity tolerance, sit<>stand, standing balance, and safety awareness.  Therapy Documentation Precautions:  Precautions Precautions: Fall Restrictions Weight Bearing Restrictions: No General: General OT Amount of Missed Time: 30 Minutes pt missed skilled OT services secondary to RT providing respiratory treatment Pain: Pain Assessment Pain Assessment: No/denies pain Faces Pain Scale: No hurt Pain Type: Acute pain Pain Location: Head Pain Descriptors / Indicators: Aching Pain Intervention(s): Medication (See eMAR)  See FIM for current functional status  Therapy/Group: Individual Therapy  [name], Danelle Curiale Chappell 07/30/2014, 12:17 PM

## 2014-07-30 NOTE — Progress Notes (Signed)
[facility] Individual Statement of Services  Patient Name:  Roy Mitchell  Date:  07/30/2014  Welcome to the [facility].  Our goal is to provide you with an individualized program based on your diagnosis and situation, designed to meet your specific needs.  With this comprehensive rehabilitation program, you will be expected to participate in at least 3 hours of rehabilitation therapies Monday-Friday, with modified therapy programming on the weekends.  Your rehabilitation program will include the following services:  Physical Therapy (PT), Occupational Therapy (OT), Speech Therapy (ST), 24 hour per day rehabilitation nursing, Case Management (Social Worker), Rehabilitation Medicine, Nutrition Services and Pharmacy Services  Weekly team conferences will be held on Tuesdays to discuss your progress.  Your Social Worker will talk with you frequently to get your input and to update you on team discussions.  Team conferences with you and your family in attendance may also be held.  Expected length of stay:  24 - 27 days  Overall anticipated outcome:  Minimal assistance  Depending on your progress and recovery, your program may change. Your Social Worker will coordinate services and will keep you informed of any changes. Your Social Worker's name and contact numbers are listed  below.  The following services may also be recommended but are not provided by the [facility]:   Driving Evaluations  Home Health Rehabiltiation Services  Outpatient Rehabilitation Services   Arrangements will be made to provide these services after discharge if needed.  Arrangements include referral to agencies that provide these services.  Your insurance has been verified to be:  Medicare and AARP Your primary doctor is:  Lonie Peakathan [place], [place]  Pertinent information will be shared with your doctor and your insurance company.  Social Worker:  Staci AcostaJenny Montrice Gracey,  LCSW  [phone] or [phone]) [phone]  Information discussed with and copy given to patient by: Elvera LennoxPrevatt, Gayle Collard Capps, 07/30/2014, 9:09 AM

## 2014-07-30 NOTE — Progress Notes (Signed)
[facility]     PROGRESS NOTE    Subjective/Complaints: Denies any issues today.  A  review of systems has been performed and if not noted above is otherwise negative. Patient Insight is reduced however   Objective: Vital Signs: Blood pressure 136/68, pulse 91, temperature 97.4 F (36.3 C), temperature source Oral, resp. rate 16, height  (1.676 m), weight 78.7 kg (173 lb 8 oz), SpO2 94 %. Dg Chest 2 View  07/29/2014   CLINICAL DATA:  Aspiration pneumonia, wheezing  EXAM: CHEST  2 VIEW  COMPARISON:  Portable chest x-ray 07/08/2014  FINDINGS: There is parenchymal opacity remaining at the lung bases some of which may represent atelectasis, but residual pneumonia cannot be excluded. No effusion is seen. Heart size is stable.  IMPRESSION: Persistent basilar opacities consistent with atelectasis, but pneumonia cannot be excluded.   Electronically Signed   By: Dwyane Dee M.D.   On: 07/29/2014 08:21    Recent Labs  07/29/14 0447  WBC 8.6  HGB 12.8*  HCT 40.2  PLT 126*    Recent Labs  07/29/14 0447  NA 135  K 5.2*  CL 99  GLUCOSE 88  BUN 21  CREATININE 0.68  CALCIUM 8.7   CBG (last 3)   Recent Labs  07/29/14 1726 07/29/14 2050 07/30/14 0605  GLUCAP 132* 101* 102*    Wt Readings from Last 3 Encounters:  07/29/14 78.7 kg (173 lb 8 oz)  07/08/14 84.3 kg (185 lb 13.6 oz)  04/30/14 89.585 kg (197 lb 8 oz)    Physical Exam:  Constitutional: He is oriented to person, place, and time. He appears well-developed and well-nourished.  HENT:  Head: Normocephalic and atraumatic.  Well healed left crani incision.  Eyes: Conjunctivae are normal. Pupils are equal, round, and reactive to light.  Neck: Normal range of motion. Neck supple.  Cardiovascular: Normal rate and regular rhythm.  No murmur heard. Respiratory: seems to be having more difficulty breathing. He has occasional wheezes and scattered rhonchi. Decreased sounds and  rales at bilateral bases.  GI: Soft. Bowel sounds are normal. He exhibits no distension. There is no tenderness.  Musculoskeletal: He exhibits no edema or tenderness.  Neurological: He is alert and oriented to person, place, and time.  Continued dysarthria and dysphonia with expressive deficits. Decreased oro-motor control. Able to answer basic biographic questions. Needs occasional cues to follow simple one step commands and apraxia noted with MMT. UE grossly 4/5 prox to distal. LE: 2+ hf, 3 ke and 4/5 ankle dorsi and plantarflexion. Sensory exam grossly intact Skin: Skin is warm and dry. Some scattered ecchymoses. BLE  muscle wasting.  Psych: pt pleasant and generally appropriate  Assessment/Plan: 1. Functional deficits secondary to left SDH complicated by deconditioning from multiple medical issues which require 3+ hours per day of interdisciplinary therapy in a comprehensive inpatient rehab setting. Physiatrist is providing close team supervision and 24 hour management of active medical problems listed below. Physiatrist and rehab team continue to assess barriers to discharge/monitor patient progress toward functional and medical goals. FIM: FIM - Bathing Bathing Steps Patient Completed: Chest, Right Arm, Left Arm, Abdomen Bathing: 1: Two helpers  FIM - Upper Body Dressing/Undressing Upper body dressing/undressing steps patient completed: Thread/unthread right sleeve of pullover shirt/dresss, Thread/unthread left sleeve of pullover shirt/dress Upper body dressing/undressing: 3: Mod-Patient completed 50-74% of tasks FIM - Lower Body Dressing/Undressing Lower body dressing/undressing: 1: Two helpers        FIM - Psychologist, occupational  Bed/Chair Transfer Assistive Devices: HOB elevated, Bed rails Bed/Chair Transfer: 2: Supine > Sit: Max A (lifting assist/Pt. 25-49%), 1: Two helpers  FIM - Locomotion: Wheelchair Distance: 10' Locomotion: Wheelchair: 1: Travels less than 50 ft with  supervision, cueing or coaxing FIM - Locomotion: Ambulation Locomotion: Ambulation Assistive Devices: Parallel bars Locomotion: Ambulation: 1: Two helpers  Comprehension Comprehension Mode: Auditory Comprehension: 5-Follows basic conversation/direction: With extra time/assistive device  Expression Expression Mode: Verbal Expression: 3-Expresses basic 50 - 74% of the time/requires cueing 25 - 50% of the time. Needs to repeat parts of sentences.  Social Interaction Social Interaction: 5-Interacts appropriately 90% of the time - Needs monitoring or encouragement for participation or interaction.  Problem Solving Problem Solving: 3-Solves basic 50 - 74% of the time/requires cueing 25 - 49% of the time  Memory Memory: 4-Recognizes or recalls 75 - 89% of the time/requires cueing 10 - 24% of the time Medical Problem List and Plan: 1. Functional deficits secondary to Left SDH complicated by acute respiratory failure 2. DVT Prophylaxis/Anticoagulation: Mechanical: Sequential compression devices, below knee Bilateral lower extremities 3. Pain Management: tylenol prn 4. Mood: Continue remeron at bedtime. LCSW to follow for evaluation and support.  5. Neuropsych: This patient is not capable of making decisions on his own behalf. 6. Skin/Wound Care: Routine pressure relief measures.  7. Fluids/Electrolytes/Nutrition: Monitor I/O.    - Continue pro stat supplement tid. Offer ensure pudding or magic cup between meals.  -follow up K+ later in week  -encourage PO  8. Hematuria: Has h/o BPH--sees Dr. Saddie Bendershao in [place]. Continue flomax and finasteride. Monitor voiding with PVR check. Monitor H/H.   -HGB trending up 9. Reactive airway disease: Was on nocturnal oxygen with nebs bid PTA.   -follow up cxr today given clinical picture  -add mucomyst to help with secretions  -decreased prednisone to 10 mg and slowly taper prednisone to 5 mg daily per recommendations.   -IS, coughing, OOB 10.  HTN: Will monitor BP every 8 hours. Continue metoprolol bid.  11. Seizure prophylaxis: Will continue Keppra bid 12. Acute respiratory failure: Continue budesonide nebs bid. Encourage IS every 4 hours while awake.   -oob, encouraged him to cough up secretions  -cxr as above  LOS (Days) 2 A FACE TO FACE EVALUATION WAS PERFORMED  SWARTZ,ZACHARY T 07/30/2014 8:30 AM

## 2014-07-31 ENCOUNTER — Inpatient Hospital Stay (HOSPITAL_COMMUNITY): Payer: Medicare Other

## 2014-07-31 ENCOUNTER — Inpatient Hospital Stay (HOSPITAL_COMMUNITY): Payer: Medicare Other | Admitting: *Deleted

## 2014-07-31 ENCOUNTER — Inpatient Hospital Stay (HOSPITAL_COMMUNITY): Payer: Medicare Other | Admitting: Speech Pathology

## 2014-07-31 LAB — GLUCOSE, CAPILLARY
GLUCOSE-CAPILLARY: 124 mg/dL — AB (ref 70–99)
Glucose-Capillary: 102 mg/dL — ABNORMAL HIGH (ref 70–99)
Glucose-Capillary: 111 mg/dL — ABNORMAL HIGH (ref 70–99)
Glucose-Capillary: 100 mg/dL — ABNORMAL HIGH (ref 70–99)

## 2014-07-31 MED ORDER — SODIUM CHLORIDE 0.9 % IV SOLN
INTRAVENOUS | Status: DC
  Filled 2014-07-31: qty 1000

## 2014-07-31 MED ORDER — CETYLPYRIDINIUM CHLORIDE 0.05 % MT LIQD
7.0000 mL | Freq: Two times a day (BID) | OROMUCOSAL | Status: DC
  Administered 2014-08-01 – 2014-08-06 (×9): 7 mL via OROMUCOSAL

## 2014-07-31 MED ORDER — SODIUM CHLORIDE 0.9 % IV SOLN
INTRAVENOUS | Status: AC
  Administered 2014-07-31: 11:00:00 via INTRAVENOUS

## 2014-07-31 MED ORDER — CHLORHEXIDINE GLUCONATE 0.12 % MT SOLN
15.0000 mL | Freq: Two times a day (BID) | OROMUCOSAL | Status: DC
  Administered 2014-07-31 – 2014-08-07 (×13): 15 mL via OROMUCOSAL
  Filled 2014-07-31 (×16): qty 15

## 2014-07-31 NOTE — Progress Notes (Signed)
Occupational Therapy Session Note  Patient Details  Name: Roy Mitchell MRN: [id number] Date of Birth: Apr 26, 1924  Today's Date: 07/31/2014 OT Individual Time: 1351-1435 OT Individual Time Calculation (min): 44 min    Make up Session  Skilled Therapeutic Interventions/Progress Updates:    Pt seen for makeup session.  He transferred from supine to sitting with min assist.  Increased SOB noted with O2 sats at 93 % on 2Ls nasal cannula.  Pt reporting increased dizziness as well in sitting with BP checked at 121/78.  Did not note any gaze nystagmus but in talking with pt her reports a history of vestibular issues and tinnitus.  Dizziness subsided afte2-3 mins of sitting.  He was able to maintain static sitting EOB with close supervision but does sit in a posterior pelvic tilt with increased posterior lean.  Worked on sit to stand and standing balance from EOB.  Pt needing total assist for initial stand but was unable to maintain for more than 3-5 seconds before his knees buckled and needed to sit.  After 3-4 minute rest break he stood again this time with total assist +2 (pt 35%) and maintained standing for around 30 seconds.  Dyspnea increased to 3/4 but after sitting O2 sats on 2Ls still at 96%.  On 3rd attempt pt only achieved partial stand as he attempted to step forward with the RLE before extending his knees fully, resulting in posterior lean.  Pt's anxiety increased at this point and he was unable to recover and achieve full stand.  Finished session with transfer to the wheelchair with max assist stand pivot.    Therapy Documentation Precautions:  Precautions Precautions: Fall Restrictions Weight Bearing Restrictions: No  Vital Signs: Therapy Vitals Pulse Rate: 87 BP: 110/78 mmHg Patient Position (if appropriate): Sitting Oxygen Therapy SpO2: 96 % O2 Device: Nasal Cannula O2 Flow Rate (L/min): 2 L/min Pulse Oximetry Type: Intermittent Pain: Pain Assessment Pain Assessment:  No/denies pain Faces Pain Scale: No hurt Pain Type: Acute pain Pain Location: Head Pain Intervention(s): Medication (See eMAR) ADL: See FIM for current functional status  Therapy/Group: Individual Therapy  Roy Mitchell 07/31/2014, 2:43 PM

## 2014-07-31 NOTE — Progress Notes (Signed)
[facility] MEDICINE & REHABILITATION     PROGRESS NOTE    Subjective/Complaints: Feeling well this morning. No sob. In good spirits  A  review of systems has been performed and if not noted above is otherwise negative. Patient Insight is reduced however   Objective: Vital Signs: Blood pressure 123/73, pulse 99, temperature 98.3 F (36.8 C), temperature source Oral, resp. rate 18, height  (1.676 m), weight 78.7 kg (173 lb 8 oz), SpO2 95 %. Dg Chest 2 View  07/30/2014   CLINICAL DATA:  Subsequent evaluation for cough and shortness of breath for a few days  EXAM: CHEST  2 VIEW  COMPARISON:  07/28/2014  FINDINGS: Stable cardiac enlargement and aortic arch calcification as well as mesh graft proximal aorta. Vascular pattern normal with no evidence of edema infiltrate or effusion. Mild bibasilar opacities again appear most consistent with scarring or atelectasis.  IMPRESSION: Stable bibasilar opacities.   Electronically Signed   By: Esperanza Heir M.D.   On: 07/30/2014 13:51    Recent Labs  07/29/14 0447  WBC 8.6  HGB 12.8*  HCT 40.2  PLT 126*    Recent Labs  07/29/14 0447 07/30/14 0958  NA 135 135  K 5.2* 4.8  CL 99 99  GLUCOSE 88 148*  BUN 21 20  CREATININE 0.68 0.88  CALCIUM 8.7 9.0   CBG (last 3)   Recent Labs  07/30/14 1704 07/30/14 2123 07/31/14 0703  GLUCAP 118* 109* 102*    Wt Readings from Last 3 Encounters:  07/29/14 78.7 kg (173 lb 8 oz)  07/08/14 84.3 kg (185 lb 13.6 oz)  04/30/14 89.585 kg (197 lb 8 oz)    Physical Exam:  Constitutional: He is oriented to person, place, and time. He appears well-developed and well-nourished.  HENT:  Head: Normocephalic and atraumatic.  Well healed left crani incision.  Eyes: Conjunctivae are normal. Pupils are equal, round, and reactive to light.  Neck: Normal range of motion. Neck supple.  Cardiovascular: Normal rate and regular rhythm.  No murmur heard. Respiratory: appears more  comfortable. He has occasional wheezes and scattered rhonchi which are improved.  GI: Soft. Bowel sounds are normal. He exhibits no distension. There is no tenderness.  Musculoskeletal: He exhibits no edema or tenderness.  Neurological: He is alert and oriented to person, place, and time.  Continued dysarthria and dysphonia with expressive deficits. Decreased oro-motor control. Able to answer basic biographic questions. Needs occasional cues to follow simple one step commands and apraxia noted with MMT. UE grossly 4/5 prox to distal. LE: 2+ hf, 3 ke and 4/5 ankle dorsi and plantarflexion. Sensory exam grossly intact Skin: Skin is warm and dry. Some scattered ecchymoses. BLE  muscle wasting.  Psych: pt pleasant and generally appropriate  Assessment/Plan: 1. Functional deficits secondary to left SDH complicated by deconditioning from multiple medical issues which require 3+ hours per day of interdisciplinary therapy in a comprehensive inpatient rehab setting. Physiatrist is providing close team supervision and 24 hour management of active medical problems listed below. Physiatrist and rehab team continue to assess barriers to discharge/monitor patient progress toward functional and medical goals. FIM: FIM - Bathing Bathing Steps Patient Completed: Chest, Right Arm, Left Arm, Abdomen Bathing: 1: Two helpers  FIM - Upper Body Dressing/Undressing Upper body dressing/undressing steps patient completed: Thread/unthread right sleeve of pullover shirt/dresss, Thread/unthread left sleeve of pullover shirt/dress Upper body dressing/undressing: 3: Mod-Patient completed 50-74% of tasks FIM - Lower Body Dressing/Undressing Lower body dressing/undressing: 1: Two helpers  FIM - Bed/Chair Transfer Bed/Chair Transfer Assistive Devices: HOB elevated, Bed rails, Arm rests, Walker Bed/Chair Transfer: 4: Supine > Sit: Min A (steadying Pt. > 75%/lift 1 leg), 1: Two helpers, 3: Sit > Supine: Mod A  (lifting assist/Pt. 50-74%/lift 2 legs)  FIM - Locomotion: Wheelchair Distance: 10' Locomotion: Wheelchair: 2: Travels 50 - 149 ft with supervision, cueing or coaxing FIM - Locomotion: Ambulation Locomotion: Ambulation Assistive Devices: Parallel bars Locomotion: Ambulation: 0: Activity did not occur  Comprehension Comprehension Mode: Auditory Comprehension: 5-Follows basic conversation/direction: With extra time/assistive device  Expression Expression Mode: Verbal Expression: 3-Expresses basic 50 - 74% of the time/requires cueing 25 - 50% of the time. Needs to repeat parts of sentences.  Social Interaction Social Interaction: 5-Interacts appropriately 90% of the time - Needs monitoring or encouragement for participation or interaction.  Problem Solving Problem Solving: 3-Solves basic 50 - 74% of the time/requires cueing 25 - 49% of the time  Memory Memory: 3-Recognizes or recalls 50 - 74% of the time/requires cueing 25 - 49% of the time Medical Problem List and Plan: 1. Functional deficits secondary to Left SDH complicated by acute respiratory failure 2. DVT Prophylaxis/Anticoagulation: Mechanical: Sequential compression devices, below knee Bilateral lower extremities 3. Pain Management: tylenol prn 4. Mood: Continue remeron at bedtime. LCSW to follow for evaluation and support.  5. Neuropsych: This patient is not capable of making decisions on his own behalf. 6. Skin/Wound Care: Routine pressure relief measures.  7. Fluids/Electrolytes/Nutrition: Monitor I/O.    - Continue pro stat supplement tid. Offer ensure pudding or magic cup between meals.  -follow up K+ and labs ok yesterday  -encourage PO   -HS IVF 8. Hematuria: Has h/o BPH--sees Dr. Saddie Bendershao in [place]. Continue flomax and finasteride. Monitor voiding with PVR check. Monitor H/H.   -HGB trending up 9. Reactive airway disease: Was on nocturnal oxygen with nebs bid PTA.   -follow up cxr stable to  improved  -added mucomyst to help with secretions which seems to have helped  -decreased prednisone to 10 mg and slowly taper prednisone to 5 mg daily Monday  -IS, coughing, OOB 10. HTN: Will monitor BP every 8 hours. Continue metoprolol bid.  11. Seizure prophylaxis: Will continue Keppra bid 12. Acute respiratory failure: Continue budesonide nebs bid. Encourage IS every 4 hours while awake.   -oob, encouraged him to cough up secretions, mucomyst    LOS (Days) 3 A FACE TO FACE EVALUATION WAS PERFORMED  SWARTZ,ZACHARY T 07/31/2014 8:26 AM

## 2014-07-31 NOTE — Progress Notes (Signed)
Patient with significant orthostatic changes today. Flomax was resumed at admission due to hesitancy. Poor intake as well as medication is likely contributing to symptoms. Will discontinue and 1 liter normal saline over 24 hours then change to HS. Wife in room instructed to help with oral fluids intake

## 2014-07-31 NOTE — IPOC Note (Signed)
Overall Plan of Care [facility](IPOC) Patient Details Name: Roy Mitchell MRN: [id number] DOB: 02/19/1924  Admitting Diagnosis: L PARIETAL SDH  CRANI  Hospital Problems: Principal Problem:   Traumatic subdural hematoma with loss of consciousness of 6 hours to 24 hours Active Problems:   Mild reactive airways disease   Dysphagia, pharyngoesophageal phase     Functional Problem List: Nursing Bladder, Bowel, Medication Management, Nutrition, Safety, Skin Integrity  PT Balance, Motor, Endurance  OT Balance, Cognition, Endurance, Motor, Pain, Safety  SLP Cognition, Linguistic, Nutrition  TR         Basic ADL's: OT Grooming, Bathing, Dressing, Toileting     Advanced  ADL's: OT Simple Meal Preparation     Transfers: PT Bed Mobility, Bed to Chair, Car, Occupational psychologisturniture  OT Toilet, Research scientist (life sciences)Tub/Shower     Locomotion: PT Stairs, Psychologist, prison and probation servicesWheelchair Mobility, Ambulation     Additional Impairments: OT None  SLP Swallowing, Communication, Social Cognition expression Problem Solving, Memory, Attention, Awareness  TR      Anticipated Outcomes Item Anticipated Outcome  Self Feeding Mod I  Swallowing  supervision with least restrictive diet    Basic self-care  overall Min A  Toileting  Min A   Bathroom Transfers Min A  Bowel/Bladder  Remain continent with min assist  Transfers  min assist basic and car  Locomotion  min assist gait x 150' in controlled environment, supervision w/c x 150' controlled and home environments  Communication  Min assist   Cognition  Min assist   Pain  Less than 3 out of 10  Safety/Judgment  min assist    Therapy Plan: PT Intensity: Minimum of 1-2 x/day ,45 to 90 minutes PT Frequency: 5 out of 7 days PT Duration Estimated Length of Stay: 24-27 days OT Intensity: Minimum of 1-2 x/day, 45 to 90 minutes OT Frequency: 5 out of 7 days OT Duration/Estimated Length of Stay: 25 - 28 days SLP Intensity: Minumum of 1-2 x/day, 30 to 90 minutes SLP Frequency: 5 out of 7  days SLP Duration/Estimated Length of Stay: 21-28 days        Team Interventions: Nursing Interventions Patient/Family Education, Bladder Management, Bowel Management, Disease Management/Prevention, Medication Management, Skin Care/Wound Management, Discharge Planning  PT interventions Ambulation/gait training, Balance/vestibular training, Cognitive remediation/compensation, Discharge planning, Community reintegration, DME/adaptive equipment instruction, Functional electrical stimulation, Functional mobility training, Patient/family education, Neuromuscular re-education, Psychosocial support, Splinting/orthotics, Therapeutic Exercise, Therapeutic Activities, Stair training, UE/LE Strength taining/ROM, UE/LE Coordination activities, Wheelchair propulsion/positioning  OT Interventions Balance/vestibular training, Discharge planning, Pain management, Self Care/advanced ADL retraining, Therapeutic Activities, UE/LE Coordination activities, Cognitive remediation/compensation, Functional mobility training, Patient/family education, Therapeutic Exercise, Community reintegration, DME/adaptive equipment instruction, Neuromuscular re-education, Psychosocial support, UE/LE Strength taining/ROM  SLP Interventions Cognitive remediation/compensation, Cueing hierarchy, Dysphagia/aspiration precaution training, Functional tasks, Patient/family education, Therapeutic Activities, Speech/Language facilitation, Oral motor exercises, Multimodal communication approach, Internal/external aids, Environmental controls  TR Interventions    SW/CM Interventions Discharge Planning, Psychosocial Support, Patient/Family Education    Team Discharge Planning: Destination: PT-Home ,OT- Home , SLP-Home Projected Follow-up: PT-Home health PT, OT-  24 hour supervision/assistance, Home health OT, SLP-Home Health SLP, 24 hour supervision/assistance Projected Equipment Needs: PT-To be determined, OT- To be determined, SLP-None recommended  by SLP Equipment Details: PT- , OT-has cane Patient/family involved in discharge planning: PT- Patient, Family member/caregiver,  OT-Patient, Family member/caregiver, SLP-Patient, Family member/caregiver  MD ELOS: 11-14d Medical Rehab Prognosis:  Good Assessment: 81 y.o. male with history of DDD, HTN, vertigo, tinnitus,TVAR, oxygen dependent at nights, PAD who sustained a fall 2  days PTA on 06/26/14 with new onset of word finding difficulty as well as difficulty using eating utensils. CT head done revealing acute left parietal SDH with 17mm thickness. Chronic plavix discontinued with plans for conservative care. Patient had decline in speech as well as right sided weakness later that day and was taken to OR by Dr. Danielle Dess for left parietal craniotomy for evacuation of subdural hematoma. Post op intubated till 12/13 but has issues with hypoxia requiring NRB mask. CXR with total right lung collapse and required bronchoscopy 12/15 to help clear mucous plugging. He has transient improvement but developed lethargy with increase in oxygen needs on 12/18 requiring venturi mask. He was treated with IV antibiotics for mucopurulent bronchitis and was kept NPO due to dysphagia   Now requiring 24/7 Rehab RN,MD, as well as CIR level PT, OT and SLP.  Treatment team will focus on ADLs and mobility with goals set at [facility]  See Team Conference Notes for weekly updates to the plan of care

## 2014-07-31 NOTE — Progress Notes (Signed)
Physical Therapy Session Note  Patient Details  Name: Roy Mitchell MRN: [id number] Date of Birth: 1924-07-08  Today'Mitchell Date: 07/31/2014 PT Individual Time: 1530-1630 PT Individual Time Calculation (min): 60 min   Short Term Goals: Week 1:  PT Short Term Goal 1 (Week 1): pt will perform supine> sit with mod assist  PT Short Term Goal 2 (Week 1): pt will perform basic transfer with assistance from 1 person PT Short Term Goal 3 (Week 1): pt will propel w/c x 50' with supervision PT Short Term Goal 4 (Week 1): pt will perform gait x 20' with assistance of 2 people PT Short Term Goal 5 (Week 1): pt will negotiate 1 step with assistance of 1 person  Skilled Therapeutic Interventions/Progress Updates:  1:1. Pt received sitting in w/c, ready for therapy. Focus this session on activity tolerance, functional w/c propulsion and transfers and standing endurance. Pt req min A for w/c propulsion 75'x1 with B UE, demonstrating very slow and inconsistent pace, cues with intermittent HOH assist for R UE regarding propulsion technique. Pt req mod A for w/c<>tx mat with RW as well as multiple t/f sit<>stand with consistent cues and HOH assist for hand placement. Pt engaged in round of horseshoes to target standing endurance, however, fatiguing extremely quickly during task. During bouts of standing, pt only able to throw 5 horseshoes 1x, 3 horseshoes 2x and 2 horseshoes 3x. Pt completed remaining tosses at seated level. Pt able to safely maintain standing at end of session to safely perform SPT back to w/c, req mod A for completion of lateral scoot t/f. Pt req frequent seated rest breaks throughout session due to fatigue and SOB with cues for posture and breathing technique. BP monitored throughout session due to reports of orthostatic hypotension earlier in day, all WNL, see vitals sheet. Pt transported back to room at end of session by therapist for time management. Pt left sitting in w/c at end of session w/ all  needs in reach and quick release belt in place.   Therapy Documentation Precautions:  Precautions Precautions: Fall Restrictions Weight Bearing Restrictions: No Pain: Pain Assessment Pain Assessment: No/denies pain Faces Pain Scale: No hurt  See FIM for current functional status  Therapy/Group: Individual Therapy  Roy Mitchell, Roy Mitchell 07/31/2014, 5:17 PM

## 2014-07-31 NOTE — Progress Notes (Signed)
Speech Language Pathology Daily Session Note  Patient Details  Name: Roy Mitchell MRN: [id number] Date of Birth: 01-24-1924  Today's Date: 07/31/2014 SLP Individual Time: 0800-0900 SLP Individual Time Calculation (min): 60 min  Short Term Goals: Week 1: SLP Short Term Goal 1 (Week 1): Pt will tolerate presentations of his currently prescribed diet with no overt s/s of aspiration with min cues for use of swallowing precautions.   SLP Short Term Goal 2 (Week 1): Pt will tolerate trials of advanced consistencies with no overt s/s of aspiration and min cues for use of swallowing precautions.   SLP Short Term Goal 3 (Week 1): Pt will improve speech intelligibility at the phrase level to ~75% accuracy with mod cues for use of dysarthria strategies.   SLP Short Term Goal 4 (Week 1): Pt will improve functional word finding at the phrase level over 75% of observable opportunities with mod cues for use of compensatory strategies.  SLP Short Term Goal 5 (Week 1): Pt will improve recall of daily information over 75% of observable opportunities with mod cues for use of external aids.   SLP Short Term Goal 6 (Week 1): Pt will improve functional problem solving during basic, structured, therapeutic tasks over 75% of observable opportunities with mod cues.    Skilled Therapeutic Interventions: Skilled treatment session focused on dysphagia and cognitive goals. Upon arrival, patient was awake while supine in bed and agreeable to participate in treatment session.  SLP facilitated session by transferring the patient to wheelchair with +2 assist for safety to increase arousal and optimize positioning for PO intake.  Patient reported constant dizziness and "not being able to put it together." Patient also demonstrated very limited PO intake due to not feeling well, however, patient did not demonstrate overt s/s of aspiration with limited trials of nectar-thick liquids via cup and puree textures but required Min A  verbal cues for utilization of multiple swallows. Patient's BP was taken at end of session due to continued reports of dizziness and patient was transferred back to bed due to BP reading of 86/ 56. Patient was 100% intelligible throughout the session but demonstrate phonemic paraphasias X 2 that required total A to self-correct. Patient left with all needs within reach. Continue with current plan of care.     FIM:  Comprehension Comprehension Mode: Auditory Comprehension: 5-Follows basic conversation/direction: With extra time/assistive device Expression Expression Mode: Verbal Expression: 3-Expresses basic 50 - 74% of the time/requires cueing 25 - 50% of the time. Needs to repeat parts of sentences. Social Interaction Social Interaction: 4-Interacts appropriately 75 - 89% of the time - Needs redirection for appropriate language or to initiate interaction. Problem Solving Problem Solving: 3-Solves basic 50 - 74% of the time/requires cueing 25 - 49% of the time Memory Memory: 3-Recognizes or recalls 50 - 74% of the time/requires cueing 25 - 49% of the time FIM - Eating Eating Activity: 5: Set-up assist for open containers;5: Supervision/cues  Pain Pain Assessment Pain Assessment: No/denies pain   Therapy/Group: Individual Therapy  Roy Mitchell 07/31/2014, 4:35 PM

## 2014-07-31 NOTE — Progress Notes (Signed)
Occupational Therapy Session Note  Patient Details  Name: Roy Mitchell MRN: [id number] Date of Birth: [birth year]  Today's Date: 07/31/2014 OT Individual Time:  - 0930-1030  (60 min)       Short Term Goals: Week 1:  OT Short Term Goal 1 (Week 1): Pt will peform bathing with Max A of 1 person in order to decrease assistance for self care task. OT Short Term Goal 2 (Week 1): Pt will perform LB dressing with Max A of 1 person for clothing management. OT Short Term Goal 3 (Week 1): Pt will perform UB dressing with Min A in order to increase independence in self care. OT Short Term Goal 4 (Week 1): Pt will decrease to min verbal cues for initiation of self care tasks in order to decrease cues required for functional tasks.       Skilled Therapeutic Interventions/Progress Updates:    Pt. Lying in bed upon OT arrival.  BP=  97/49, Performed bathing and dressing at bed level per nursing suggestion.  Pt bathed UB with set up.  Ox - 87 % without Oxygen. Applied oxygen with 2 liter.  Returned to 95 % after 2 minutes.  Practiced rolling x5 for peri care and clothes.  Pt needed max assist for rolling and reaching.  Positioned ppt on right side for better skin integrity at end of session.  Education pt and wife on purpose.     Therapy Documentation Precautions:  Precautions Precautions: Fall Restrictions Weight Bearing Restrictions: No   Vital Signs: Therapy Vitals Pulse Rate: 72 BP: (!) 97/50 mmHg Patient Position (if appropriate): Lying Oxygen Therapy SpO2: 94 % O2 Device: Nasal Cannula O2 Flow Rate (L/min): 2 L/min Pain: Pain Assessment Faces Pain Scale: No hurt (just dizzy) Pain Type: Acute pain Pain Location: Head Pain Descriptors / Indicators: Aching Pain Intervention(s): Medication (See eMAR)          See FIM for current functional status  Therapy/Group: Individual Therapy  [name], Kamalei Roeder J 07/31/2014, 10:01 AM

## 2014-08-01 ENCOUNTER — Inpatient Hospital Stay (HOSPITAL_COMMUNITY): Payer: Medicare Other | Admitting: Speech Pathology

## 2014-08-01 ENCOUNTER — Inpatient Hospital Stay (HOSPITAL_COMMUNITY): Payer: Medicare Other | Admitting: *Deleted

## 2014-08-01 LAB — GLUCOSE, CAPILLARY
GLUCOSE-CAPILLARY: 97 mg/dL (ref 70–99)
Glucose-Capillary: 96 mg/dL (ref 70–99)
Glucose-Capillary: 122 mg/dL — ABNORMAL HIGH (ref 70–99)
Glucose-Capillary: 107 mg/dL — ABNORMAL HIGH (ref 70–99)

## 2014-08-01 MED ORDER — SODIUM CHLORIDE 0.9 % IV SOLN
INTRAVENOUS | Status: DC
  Administered 2014-08-01 – 2014-08-02 (×2): via INTRAVENOUS
  Filled 2014-08-01: qty 1000

## 2014-08-01 MED ORDER — SODIUM CHLORIDE 0.9 % IV SOLN
INTRAVENOUS | Status: AC
  Administered 2014-08-01 – 2014-08-02 (×2): via INTRAVENOUS

## 2014-08-01 NOTE — Progress Notes (Signed)
Patient ID: Roy Mitchell, male   DOB: 04/18/1924, 81 y.o.   MRN: [id number]    [place] [facility]     PROGRESS NOTE   08/01/14.  81 y/o admit for CIR with functional deficits secondary to Left SDH complicated by acute respiratory failure. Subjective/Complaints:  Feels generally weak but no focal c/os.  BP has been running low and flomax has been d/ced. A  review of systems has been performed and if not noted above is otherwise negative. Patient Insight is reduced however  Past Medical History  Diagnosis Date  . Hypercholesterolemia   . Benign prostatic hypertrophy   . GERD (gastroesophageal reflux disease)   . DDD (degenerative disc disease), cervical   . Tinnitus   . Multinodular goiter   . Prediabetes   . DDD (degenerative disc disease), lumbar   . Hypertension   . Pulmonary nodules   . Anxiety disorder       Intake/Output Summary (Last 24 hours) at 08/01/14 1105 Last data filed at 08/01/14 0900  Gross per 24 hour  Intake    360 ml  Output    500 ml  Net   -140 ml    Patient Vitals for the past 24 hrs:  BP Temp Temp src Pulse Resp SpO2  08/01/14 0907 (!) 89/50 mmHg - - 91 - -  08/01/14 0832 - - - - - 97 %  08/01/14 0555 (!) 118/59 mmHg 98.7 F (37.1 C) Oral 82 17 95 %  08/01/14 0353 129/61 mmHg - - 79 16 93 %  08/01/14 0153 135/65 mmHg - - 85 17 95 %  07/31/14 2354 (!) 143/72 mmHg - - - 17 97 %  07/31/14 2200 128/65 mmHg - - - 20 -  07/31/14 2104 - - - - - 95 %  07/31/14 2022 116/70 mmHg - - 89 - -  07/31/14 1812 134/72 mmHg - - 88 - 99 %  07/31/14 1556 - - - 88 - 97 %  07/31/14 1500 (!) 94/50 mmHg - - 84 18 95 %  07/31/14 1442 110/78 mmHg - - - - 96 %  07/31/14 1337 (!) 104/59 mmHg - - 87 - -  07/31/14 1211 106/61 mmHg - - 75 - -  07/31/14 1106 (!) 97/56 mmHg - - - - -   CBG (last 3)   Recent Labs  07/31/14 1649 07/31/14 2054 08/01/14 0619  GLUCAP 111* 100* 97    Objective: Vital Signs: Blood pressure 89/50, pulse  91, temperature 98.7 F (37.1 C), temperature source Oral, resp. rate 17, height 5\' 6"  (1.676 m), weight 78.7 kg (173 lb 8 oz), SpO2 97 %. Dg Chest 2 View  07/30/2014   CLINICAL DATA:  Subsequent evaluation for cough and shortness of breath for a few days  EXAM: CHEST  2 VIEW  COMPARISON:  07/28/2014  FINDINGS: Stable cardiac enlargement and aortic arch calcification as well as mesh graft proximal aorta. Vascular pattern normal with no evidence of edema infiltrate or effusion. Mild bibasilar opacities again appear most consistent with scarring or atelectasis.  IMPRESSION: Stable bibasilar opacities.   Electronically Signed   By: Roy Heiraymond  Rubner M.D.   On: 07/30/2014 13:51   No results for input(s): WBC, HGB, HCT, PLT in the last 72 hours.  Recent Labs  07/30/14 0958  NA 135  K 4.8  CL 99  GLUCOSE 148*  BUN 20  CREATININE 0.88  CALCIUM 9.0   CBG (last 3)   Recent Labs  07/31/14 1649 07/31/14 2054 08/01/14 0619  GLUCAP 111* 100* 97    Wt Readings from Last 3 Encounters:  07/29/14 78.7 kg (173 lb 8 oz)  07/08/14 84.3 kg (185 lb 13.6 oz)  04/30/14 89.585 kg (197 lb 8 oz)    Physical Exam:  Constitutional: He is oriented to person, place, and time. He appears well-developed and well-nourished.  HENT: n/c O2 in place Head: Normocephalic and atraumatic.  Well healed left crani incision.  Eyes: Conjunctivae are normal. Pupils are equal, round, and reactive to light.  Neck: Normal range of motion. Neck supple.  Cardiovascular: Normal rate and regular rhythm.  No murmur heard. Respiratory: appears more comfortable. Clear anterolaterally GI: Soft. Bowel sounds are normal. He exhibits no distension. There is no tenderness.  Musculoskeletal: He exhibits no edema or tenderness.  Neurological: He is alert and oriented to person, place, and time.  Continued dysarthria and dysphonia with expressive deficits.  Skin: Skin is warm and dry. Some scattered ecchymoses. BLE  muscle  wasting.  Psych: pt pleasant and generally appropriate  Medical Problem List and Plan: 1. Functional deficits secondary to Left SDH complicated by acute respiratory failure 2. DVT Prophylaxis/Anticoagulation: Mechanical: Sequential compression devices, below knee Bilateral lower extremities  3.  Neuropsych: This patient is not capable of making decisions on his own behalf.   4. Hematuria: Has h/o BPH--sees Roy Mitchell in [place]. Continue flomax and finasteride. Monitor voiding with PVR check. Monitor H/H.   -HGB trending up 5. Reactive airway disease: Was on nocturnal oxygen with nebs bid PTA.   -follow up cxr stable to improved  -added mucomyst to help with secretions which seems to have helped  -decreased prednisone to 10 mg and slowly taper prednisone to 5 mg daily Monday  -IS, coughing, OOB 6. HTN: Will monitor BP every 8 hours.  BP low this am and patient does feels weak; flomax d/ced; will also hold metoptolol 7. Seizure prophylaxis: Will continue Keppra bid 8. Acute respiratory failure: Continue budesonide nebs bid. Encourage IS every 4 hours while awake.   -oob, encouraged him to cough up secretions, mucomyst    LOS (Days) 4 A FACE TO FACE EVALUATION WAS PERFORMED  Rogelia Boga 08/01/2014 11:02 AM

## 2014-08-01 NOTE — Progress Notes (Signed)
Occupational Therapy Session Note  Patient Details  Name: Roy Mitchell MRN: [id number] Date of Birth: March 18, 1924  Today's Date: 08/01/2014 OT Individual Time:  -   0930-1030  (60 min)      Short Term Goals: Week 1:  OT Short Term Goal 1 (Week 1): Pt will peform bathing with Max A of 1 person in order to decrease assistance for self care task. OT Short Term Goal 2 (Week 1): Pt will perform LB dressing with Max A of 1 person for clothing management. OT Short Term Goal 3 (Week 1): Pt will perform UB dressing with Min A in order to increase independence in self care. OT Short Term Goal 4 (Week 1): Pt will decrease to min verbal cues for initiation of self care tasks in order to decrease cues required for functional tasks.  Week 2:     Skilled Therapeutic Interventions/Progress Updates:    1st session:  Addressed bed mobility, sitting EOB.  See below for BP readings.  Sat EOB for 15 minutes with BP improving near end.  Pt. Performed balance in sitting with minimal assist and cues for postural control.  Performed peri care in rolling to right and left with mod assist.    Per RN recommendations, placed pt back to bed at end of session.    Therapy Documentation Precautions:  Precautions Precautions: Fall Restrictions Weight Bearing Restrictions: No    Vital Signs: 105/52 supine, 105/58 supine; 90/ 54 sitting; 105/60 sitting Pain: Pain Assessment Pain Assessment: 0-10 Pain Score: 5  Pain Type: Acute pain Pain Location: Head Pain Descriptors / Indicators: Aching Pain Onset: Gradual Pain Intervention(s): Medication (See eMAR)    2nd session:  1400-1430  (30 min) Pain:  none Individual session  Pt. Sitting in wc upon OT arrival.  Wife and daughter helping pt with urinal.  Assisted with care.   Pt. 's BP was @ 105/60 during treatment.  Did sit to stand x4 for 30 sec each time with max assist.  Pt. Needed  5 minute rest in between each stand. .  Provide manual faciliatation to LE for  quad engagement and up right posture.  Pt did achieve better posture on stand #4 and family praised him for job well done.  Left pt in wc with all needs in reach.            See FIM for current functional status  Therapy/Group: Individual Therapy  [name], [name] 08/01/2014, 10:39 AM

## 2014-08-01 NOTE — Progress Notes (Signed)
Speech Language Pathology Daily Session Note  Patient Details  Name: Roy Mitchell MRN: [id number] Date of Birth: 06-05-1924  Today's Date: 08/01/2014 SLP Individual Time: 1300-1330 SLP Individual Time Calculation (min): 30 min  Short Term Goals: Week 1: SLP Short Term Goal 1 (Week 1): Pt will tolerate presentations of his currently prescribed diet with no overt s/s of aspiration with min cues for use of swallowing precautions.   SLP Short Term Goal 2 (Week 1): Pt will tolerate trials of advanced consistencies with no overt s/s of aspiration and min cues for use of swallowing precautions.   SLP Short Term Goal 3 (Week 1): Pt will improve speech intelligibility at the phrase level to ~75% accuracy with mod cues for use of dysarthria strategies.   SLP Short Term Goal 4 (Week 1): Pt will improve functional word finding at the phrase level over 75% of observable opportunities with mod cues for use of compensatory strategies.  SLP Short Term Goal 5 (Week 1): Pt will improve recall of daily information over 75% of observable opportunities with mod cues for use of external aids.   SLP Short Term Goal 6 (Week 1): Pt will improve functional problem solving during basic, structured, therapeutic tasks over 75% of observable opportunities with mod cues.    Skilled Therapeutic Interventions: Skilled ST intervention provided with focus on cognitive and dysphagia goals. Pt seen during PM meal with dining group for focus on swallow safety. Pt required min-mod verbal cues to utilize small sips. Cough noted x1 with large cup sip of NTL. Family had provided pt with thin liquid coffee. Pt noted with immediate cough response x1 with large sip of thin via cup. Slp provided education on recommended consistencies and risks of aspiration. Pt consumed 25% of lunch meal, stating that he was full, belching noted x3 following meal. Slp targeted decreased problem solving with sequencing activity. Pt 65% accurate, mod  verbal/visual cues provided with arranging 4-step picture cards into correct order. Pt attended throughout tasks, no redirections required.    FIM:  Comprehension Comprehension Mode: Auditory Comprehension: 5-Understands basic 90% of the time/requires cueing < 10% of the time Expression Expression Mode: Verbal Expression: 4-Expresses basic 75 - 89% of the time/requires cueing 10 - 24% of the time. Needs helper to occlude trach/needs to repeat words. Social Interaction Social Interaction: 6-Interacts appropriately with others with medication or extra time (anti-anxiety, antidepressant). Problem Solving Problem Solving: 3-Solves basic 50 - 74% of the time/requires cueing 25 - 49% of the time Memory Memory: 3-Recognizes or recalls 50 - 74% of the time/requires cueing 25 - 49% of the time FIM - Eating Eating Activity: 5: Supervision/cues  Pain Pain Assessment Pain Assessment: No/denies pain Pain Score: Asleep  Therapy/Group: Individual Therapy  Vicktoria Muckey, Kara PacerLeah N 08/01/2014, 1:46 PM

## 2014-08-01 NOTE — Progress Notes (Signed)
Physical Therapy Session Note  Patient Details  Name: Roy Mitchell MRN: [id number] Date of Birth: May 20, 1924  Today's Date: 08/01/2014 PT Individual Time: 1500-1600 PT Individual Time Calculation (min): 60 min   Short Term Goals: Week 1:  PT Short Term Goal 1 (Week 1): pt will perform supine> sit with mod assist  PT Short Term Goal 2 (Week 1): pt will perform basic transfer with assistance from 1 person PT Short Term Goal 3 (Week 1): pt will propel w/c x 50' with supervision PT Short Term Goal 4 (Week 1): pt will perform gait x 20' with assistance of 2 people PT Short Term Goal 5 (Week 1): pt will negotiate 1 step with assistance of 1 person  Skilled Therapeutic Interventions/Progress Updates:    Patient received sitting in wheelchair at sink, brushing teeth. Session focused on increasing activity tolerance with wheelchair mobility and static standing. Patient performed wheelchair mobility 75'x2 with B UEs and minA as well as increased time to complete. Patient performed standing in standing frame 4 x1-2 min each with prolonged sitting rest breaks between stands. Emphasis on upright posture, head/neck in neutral, scap retraction, and chest elevation with multimodal cues. Patient returned to room and left sitting in wheelchair with all needs within reach and family present.  Therapy Documentation Precautions:  Precautions Precautions: Fall Restrictions Weight Bearing Restrictions: No Vital Signs: Therapy Vitals Pulse Rate: 94 Patient Position (if appropriate): Sitting Oxygen Therapy SpO2: 96 % O2 Device: Nasal Cannula O2 Flow Rate (L/min): 0.5 L/min Pulse Oximetry Type: Intermittent Pain: Pain Assessment Pain Assessment: No/denies pain Pain Score: 0-No pain Locomotion : Ambulation Ambulation/Gait Assistance: Not tested (comment)   See FIM for current functional status  Therapy/Group: Individual Therapy  Chipper HerbBridget S Rubel Heckard S. Carlia Bomkamp, PT, DPT 08/01/2014, 3:59 PM

## 2014-08-02 ENCOUNTER — Inpatient Hospital Stay (HOSPITAL_COMMUNITY): Payer: Medicare Other | Admitting: *Deleted

## 2014-08-02 DIAGNOSIS — S065X4D Traumatic subdural hemorrhage with loss of consciousness of 6 hours to 24 hours, subsequent encounter: Secondary | ICD-10-CM

## 2014-08-02 LAB — GLUCOSE, CAPILLARY
GLUCOSE-CAPILLARY: 92 mg/dL (ref 70–99)
GLUCOSE-CAPILLARY: 101 mg/dL — AB (ref 70–99)
GLUCOSE-CAPILLARY: 101 mg/dL — AB (ref 70–99)
Glucose-Capillary: 111 mg/dL — ABNORMAL HIGH (ref 70–99)

## 2014-08-02 NOTE — Progress Notes (Signed)
Occupational Therapy Session Note  Patient Details  Name: Roy Mitchell P Lamoureaux MRN: [id number] Date of Birth: 1923/09/25  Today's Date: 08/02/2014 OT Individual Time:  -   1300-1400  (60 min)      Short Term Goals: Week 1:  OT Short Term Goal 1 (Week 1): Pt will peform bathing with Max A of 1 person in order to decrease assistance for self care task. OT Short Term Goal 2 (Week 1): Pt will perform LB dressing with Max A of 1 person for clothing management. OT Short Term Goal 3 (Week 1): Pt will perform UB dressing with Min A in order to increase independence in self care. OT Short Term Goal 4 (Week 1): Pt will decrease to min verbal cues for initiation of self care tasks in order to decrease cues required for functional tasks.  :        Skilled Therapeutic Interventions/Progress Updates:    . Pt received sitting in w/c, ready for therapy. Focus  on activity tolerance, functional w/c propulsion and transfers and standing endurance. Pt req min A for w/c propulsion 50''x1 with B UE, demonstrating very slow and inconsistent pace, cues with intermittent HOH assist for R UE regarding propulsion technique.  .  Pt engaged in standing endurance, however, fatiguing extremely quickly during task.Pt stood x4 for 20 -30 seconds with 4/4 on dyspnea scale when standing.  Pt needed mod assist for sit to stand.  He needed frequent seated rest breaks throughout session due to fatigue and SOB.  Needed cues for posture and breathing technique.  BP monitored at end of session with reading = 131/71, HR= 92.  . Pt transported back to room at end of session by therapist for time management.  Pt left sitting in w/c at end of session w/ all needs in reach and quick release belt in place.    Therapy Documentation Precautions:  Precautions Precautions: Fall Restrictions Weight Bearing Restrictions: No    Vital Signs: Oxygen Therapy O2 Device: Nasal Cannula O2 Flow Rate (L/min): 2 L/min Pain:  None noted             See FIM for current functional status  Therapy/Group: Individual Therapy  [name], Chukwuemeka Artola J 08/02/2014, 11:50 AM

## 2014-08-02 NOTE — Progress Notes (Signed)
Patient ID: Roy Mitchell, male   DOB: July 11, 1924, 81 y.o.   MRN: [id number]  Patient ID: Roy Mitchell, male   DOB: [birth year], 81 y.o.   MRN: [id number]    [facility]     PROGRESS NOTE   08/02/14.  81 y/o admit for CIR with functional deficits secondary to Left SDH complicated by acute respiratory failure. Subjective/Complaints:  Feels generally well with  no focal c/os.  BP has been running low and flomax has been d/ced.  BP improved today A  review of systems has been performed and if not noted above is otherwise negative.   Past Medical History  Diagnosis Date  . Hypercholesterolemia   . Benign prostatic hypertrophy   . GERD (gastroesophageal reflux disease)   . DDD (degenerative disc disease), cervical   . Tinnitus   . Multinodular goiter   . Prediabetes   . DDD (degenerative disc disease), lumbar   . Hypertension   . Pulmonary nodules   . Anxiety disorder       Intake/Output Summary (Last 24 hours) at 08/02/14 1013 Last data filed at 08/01/14 1945  Gross per 24 hour  Intake    300 ml  Output    100 ml  Net    200 ml    Patient Vitals for the past 24 hrs:  BP Temp Temp src Pulse Resp SpO2  08/02/14 0549 131/71 mmHg 97.8 F (36.6 C) Oral 84 18 96 %  08/01/14 2100 109/63 mmHg - - 88 18 -  08/01/14 2024 - - - - - 97 %  08/01/14 1558 - - - - - 96 %  08/01/14 1400 (!) 104/58 mmHg 97.7 F (36.5 C) Oral 94 18 97 %   CBG (last 3)   Recent Labs  08/01/14 1700 08/01/14 2034 08/02/14 0551  GLUCAP 122* 107* 111*    Objective: Vital Signs: Blood pressure 131/71, pulse 84, temperature 97.8 F (36.6 C), temperature source Oral, resp. rate 18, height  (1.676 m), weight 78.7 kg (173 lb 8 oz), SpO2 96 %. No results found. No results for input(s): WBC, HGB, HCT, PLT in the last 72 hours. No results for input(s): NA, K, CL, GLUCOSE, BUN, CREATININE, CALCIUM in the last 72 hours.  Invalid input(s): CO CBG (last 3)    Recent Labs  08/01/14 1700 08/01/14 2034 08/02/14 0551  GLUCAP 122* 107* 111*    Wt Readings from Last 3 Encounters:  07/29/14 78.7 kg (173 lb 8 oz)  07/08/14 84.3 kg (185 lb 13.6 oz)  04/30/14 89.585 kg (197 lb 8 oz)    Physical Exam:  Constitutional: He is oriented to person, place, and time. He appears well-developed and well-nourished.  HENT: n/c O2 in place Head: Normocephalic and atraumatic.  Well healed left crani incision.  Eyes: Conjunctivae are normal. Pupils are equal, round, and reactive to light.  Neck: Normal range of motion. Neck supple.  Cardiovascular: Normal rate and regular rhythm.  No murmur heard. Respiratory: appears more comfortable. Clear anterolaterally GI: Soft. Bowel sounds are normal. He exhibits no distension. There is no tenderness.  Musculoskeletal: He exhibits no edema or tenderness.  Neurological: He is alert and oriented to person, place, and time.  Continued dysarthria and dysphonia with expressive deficits.  Skin: Skin is warm and dry. Some scattered ecchymoses. BLE  muscle wasting.  Psych: pt pleasant and generally appropriate  Medical Problem List and Plan: 1. Functional deficits secondary to Left SDH complicated by acute  respiratory failure 2. DVT Prophylaxis/Anticoagulation: Mechanical: Sequential compression devices, below knee Bilateral lower extremities  3.  Neuropsych: This patient is not capable of making decisions on his own behalf.   4. Hematuria: Has h/o BPH--sees Dr. Saddie Bendershao in [place]. Continue flomax and finasteride. Monitor voiding with PVR check. Monitor H/H.   -HGB trending up 5. Reactive airway disease: Was on nocturnal oxygen with nebs bid PTA.   -follow up cxr stable to improved  -added mucomyst to help with secretions which seems to have helped  -decreased prednisone to 10 mg and slowly taper prednisone to 5 mg daily Monday  -IS, coughing, OOB 6. HTN: Will monitor BP every 8 hours.  7. Seizure  prophylaxis: Will continue Keppra bid 8. Acute respiratory failure: Continue budesonide nebs bid. Encourage IS every 4 hours while awake.   -oob, encouraged him to cough up secretions, mucomyst    LOS (Days) 5 A FACE TO FACE EVALUATION WAS PERFORMED  Rogelia BogaKWIATKOWSKI,PETER FRANK 08/02/2014 10:13 AM

## 2014-08-03 ENCOUNTER — Encounter (HOSPITAL_COMMUNITY): Payer: Medicare Other

## 2014-08-03 ENCOUNTER — Inpatient Hospital Stay (HOSPITAL_COMMUNITY): Payer: Medicare Other

## 2014-08-03 LAB — GLUCOSE, CAPILLARY
GLUCOSE-CAPILLARY: 106 mg/dL — AB (ref 70–99)
GLUCOSE-CAPILLARY: 100 mg/dL — AB (ref 70–99)
Glucose-Capillary: 97 mg/dL (ref 70–99)
Glucose-Capillary: 101 mg/dL — ABNORMAL HIGH (ref 70–99)
Glucose-Capillary: 92 mg/dL (ref 70–99)

## 2014-08-03 MED ORDER — SODIUM CHLORIDE 0.9 % IV SOLN
Freq: Every day | INTRAVENOUS | Status: DC
  Administered 2014-08-03: 18:00:00 via INTRAVENOUS

## 2014-08-03 NOTE — Progress Notes (Signed)
[facility]     PROGRESS NOTE    Subjective/Complaints: "I have cotton mouth". Otherwise a good weekend. Eating fairly well A  review of systems has been performed and if not noted above is otherwise negative. Patient Insight is reduced however   Objective: Vital Signs: Blood pressure 165/81, pulse 84, temperature 98.1 F (36.7 C), temperature source Oral, resp. rate 18, height  (1.676 m), weight 78.7 kg (173 lb 8 oz), SpO2 93 %. No results found. No results for input(s): WBC, HGB, HCT, PLT in the last 72 hours. No results for input(s): NA, K, CL, GLUCOSE, BUN, CREATININE, CALCIUM in the last 72 hours.  Invalid input(s): CO CBG (last 3)   Recent Labs  08/02/14 1638 08/02/14 2042 08/03/14 0706  GLUCAP 101* 101* 97    Wt Readings from Last 3 Encounters:  07/29/14 78.7 kg (173 lb 8 oz)  07/08/14 84.3 kg (185 lb 13.6 oz)  04/30/14 89.585 kg (197 lb 8 oz)    Physical Exam:  Constitutional: He is oriented to person, place, and time. He appears well-developed and well-nourished.  HENT:  Head: Normocephalic and atraumatic.  Well healed left crani incision.  Eyes: Conjunctivae are normal. Pupils are equal, round, and reactive to light.  Neck: Normal range of motion. Neck supple.  Cardiovascular: Normal rate and regular rhythm.  No murmur heard. Respiratory: appears more comfortable. He has occasional wheezes and scattered rhonchi which are improved.  GI: Soft. Bowel sounds are normal. He exhibits no distension. There is no tenderness.  Musculoskeletal: He exhibits no edema or tenderness.  Neurological: He is alert and oriented to person, place, and time.  Continued dysarthria and dysphonia with expressive deficits. Decreased oro-motor control---but better.  apraxia noted with MMT. UE grossly 4/5 prox to distal. LE: 2+ hf, 3 ke and 4/5 ankle dorsi and plantarflexion. Sensory exam grossly intact Skin: Skin is warm and dry. Some  scattered ecchymoses.   Psych: pt pleasant and generally appropriate  Assessment/Plan: 1. Functional deficits secondary to left SDH complicated by deconditioning from multiple medical issues which require 3+ hours per day of interdisciplinary therapy in a comprehensive inpatient rehab setting. Physiatrist is providing close team supervision and 24 hour management of active medical problems listed below. Physiatrist and rehab team continue to assess barriers to discharge/monitor patient progress toward functional and medical goals. FIM: FIM - Bathing Bathing Steps Patient Completed: Chest, Right Arm, Left Arm, Abdomen Bathing: 1: Total-Patient completes 0-2 of 10 parts or less than 25%  FIM - Upper Body Dressing/Undressing Upper body dressing/undressing steps patient completed: Thread/unthread right sleeve of pullover shirt/dresss, Thread/unthread left sleeve of pullover shirt/dress Upper body dressing/undressing: 3: Mod-Patient completed 50-74% of tasks FIM - Lower Body Dressing/Undressing Lower body dressing/undressing: 1: Two helpers  FIM - Toileting Toileting: 1: Total-Patient completed zero steps, helper did all 3     FIM - Banker Devices: Arm rests, Therapist, occupational: 0: Activity did not occur  FIM - Locomotion: Wheelchair Distance: 10' Locomotion: Wheelchair: 2: Travels 50 - 149 ft with minimal assistance (Pt.>75%) FIM - Locomotion: Ambulation Locomotion: Ambulation Assistive Devices: Parallel bars Ambulation/Gait Assistance: Not tested (comment) Locomotion: Ambulation: 0: Activity did not occur  Comprehension Comprehension Mode: Auditory Comprehension: 5-Follows basic conversation/direction: With extra time/assistive device  Expression Expression Mode: Verbal Expression: 5-Expresses basic needs/ideas: With extra time/assistive device  Social Interaction Social Interaction: 6-Interacts appropriately with others with  medication or extra time (anti-anxiety, antidepressant).  Problem Solving Problem  Solving: 4-Solves basic 75 - 89% of the time/requires cueing 10 - 24% of the time  Memory Memory: 4-Recognizes or recalls 75 - 89% of the time/requires cueing 10 - 24% of the time Medical Problem List and Plan: 1. Functional deficits secondary to Left SDH complicated by acute respiratory failure 2. DVT Prophylaxis/Anticoagulation: Mechanical: Sequential compression devices, below knee Bilateral lower extremities 3. Pain Management: tylenol prn 4. Mood: Continue remeron at bedtime. LCSW to follow for evaluation and support.  5. Neuropsych: This patient is not capable of making decisions on his own behalf. 6. Skin/Wound Care: Routine pressure relief measures.  7. Fluids/Electrolytes/Nutrition: Monitor I/O.    - Continue pro stat supplement tid. Offer ensure pudding or magic cup between meals.  -encourage PO   -re-check bmet tomorrow 8. Hematuria: Has h/o BPH--sees Dr. Saddie Bendershao in [place]. Continue flomax and finasteride. Monitor voiding with PVR check. Monitor H/H.   -HGB trending up 9. Reactive airway disease: Was on nocturnal oxygen with nebs bid PTA.   -follow up cxr stable to improved  -added mucomyst to help with secretions which seems to have helped  -decreased prednisone to  5 mg daily   -IS, coughing, OOB 10. HTN: Will monitor BP every 8 hours. Continue metoprolol bid.  11. Seizure prophylaxis: Will continue Keppra bid 12. Acute respiratory failure: Continue budesonide nebs bid. Encourage IS every 4 hours while awake.   -oob, encouraged him to cough up secretions, mucomyst    LOS (Days) 6 A FACE TO FACE EVALUATION WAS PERFORMED  Roy Mitchell T 08/03/2014 8:19 AM

## 2014-08-03 NOTE — Plan of Care (Signed)
Problem: RH Ambulation Goal: LTG Patient will ambulate in controlled environment (PT) LTG: Patient will ambulate in a controlled environment, # of feet with assistance (PT).  Goal downgraded due to decreased standing endurance and strength.  Goal: LTG Patient will ambulate in home environment (PT) LTG: Patient will ambulate in home environment, # of feet with assistance (PT).  Outcome: Not Applicable Date Met:  78/67/54 Goal D/C as pt demonstrates poor and inconsistent standing endurance and strength.

## 2014-08-03 NOTE — Progress Notes (Addendum)
Physical Therapy Session Note  Patient Details  Name: Roy Mitchell Labuda MRN: [id number] Date of Birth: [birth year]  Today's Date: 08/03/2014 PT Individual Time: 1400-1500 PT Individual Time Calculation (min): 60 min   Short Term Goals: Week 1:  PT Short Term Goal 1 (Week 1): pt will perform supine> sit with mod assist  PT Short Term Goal 2 (Week 1): pt will perform basic transfer with assistance from 1 person PT Short Term Goal 3 (Week 1): pt will propel w/c x 50' with supervision PT Short Term Goal 4 (Week 1): pt will perform gait x 20' with assistance of 2 people PT Short Term Goal 5 (Week 1): pt will negotiate 1 step with assistance of 1 person  Skilled Therapeutic Interventions/Progress Updates:  1:1. Pt received seated in w/c ready for therapy. Session focused on functional transfers, wheelchair propulsion, breathing techniques, and functional endurance. Pt propelled w/c 125' feet using B UE with supervision and min cues for technique. Pt transferred w/c<>mat via lateral transfer with use of 1 arm rest and modAx1 with +2 for safety. Upon sitting EOM, pt demonstrated increased R lean and required verbal and visual cuing to correct to midline. Due to fatigue pt only able to partially transfer sit<>stand x4 from elevated mat for brief periods (<5 sec) with modA of 1 to 2 persons and RW with consistent cues for hand placement. Pt required frequent rest breaks throughout session.  Pt transported back to room in w/c due to urinary incontinence of which pt was unaware. Using Steady, pt required modAx1 to t/f sit<>stand and total assist by nursing student to assist with clothing management, clean up and assessment of skin integrity.  Pt was sitting on Jay Basic w/c cushion switched to [employer] Airlines[employer] Fusion cushion for improved postural support, pressure relief and increased sitting tolerance due to nursing report of reddened area on buttocks. Pt left sitting in w/c with quick release bet in place, wife and  son-in-law in room, and all needs within reach.  Therapy Documentation Precautions:  Precautions Precautions: Fall Restrictions Weight Bearing Restrictions: No  Pain: Pain Assessment Pain Assessment: Faces Faces Pain Scale: Hurts a little bit Pain Type: Acute pain Pain Location: Buttocks Pain Descriptors / Indicators: Discomfort  See FIM for current functional status  Therapy/Group: Individual Therapy  Everlene Otherck, Philo Kurtz 08/03/2014, 3:47 PM

## 2014-08-03 NOTE — Progress Notes (Signed)
Physical Therapy Session Note  Patient Details  Name: Roy Mitchell MRN: [id number] Date of Birth: Feb 05, 1924  Today's Date: 08/03/2014 PT Individual Time: 1000-1030 PT Individual Time Calculation (min): 30 min   Short Term Goals: Week 1:  PT Short Term Goal 1 (Week 1): pt will perform supine> sit with mod assist  PT Short Term Goal 2 (Week 1): pt will perform basic transfer with assistance from 1 person PT Short Term Goal 3 (Week 1): pt will propel w/c x 50' with supervision PT Short Term Goal 4 (Week 1): pt will perform gait x 20' with assistance of 2 people PT Short Term Goal 5 (Week 1): pt will negotiate 1 step with assistance of 1 person  Skilled Therapeutic Interventions/Progress Updates:   Pt presents in recliner. Session focused on functional endurance/activity tolerance with emphasis on sit to stands using Stedy. Due to low surface of recliner and decreased mechanical advantage of positioning of Stedy/foot placement, pt required max to total A to attempt sit to stand with Stedy and required a second person to move sitting pads into place (x 3 attempts total). Partial sit to stand from [place] with mod A and cues to control descent once transferred into w/c. Pt unable to maintain standing more than a few seconds to get pads in place. Pt requires significant rest breaks to recover from mobility attempts. Repositioned with cueing in w/c and hand off to SLP.  Therapy Documentation Precautions:  Precautions Precautions: Fall Restrictions Weight Bearing Restrictions: No Vital Signs: Therapy Vitals Oxygen Therapy O2 Device: Nasal Cannula O2 Flow Rate (L/min): 2 L/min Pain: Pain Assessment Pain Assessment: No/denies pain Pain Score: 0-No pain  See FIM for current functional status  Therapy/Group: Individual Therapy  Karolee StampsGray, Karysa Heft [facility] Inc[facility]rescia 08/03/2014, 11:49 AM

## 2014-08-03 NOTE — Progress Notes (Signed)
Occupational Therapy Session Note  Patient Details  Name: Roy Mitchell MRN: [id number] Date of Birth: June 27, 1924  Today's Date: 08/03/2014 OT Individual Time: 0700-0800 OT Individual Time Calculation (min): 60 min    Short Term Goals: Week 1:  OT Short Term Goal 1 (Week 1): Pt will peform bathing with Max A of 1 person in order to decrease assistance for self care task. OT Short Term Goal 2 (Week 1): Pt will perform LB dressing with Max A of 1 person for clothing management. OT Short Term Goal 3 (Week 1): Pt will perform UB dressing with Min A in order to increase independence in self care. OT Short Term Goal 4 (Week 1): Pt will decrease to min verbal cues for initiation of self care tasks in order to decrease cues required for functional tasks.   Skilled Therapeutic Interventions/Progress Updates:    Pt resting in bed upon arrival and agreeable to participating in bathing and dressing seated EOB.  Pt c/o dizziness when seated EOB but resolved in approx 30 seconds.  BP sitting 137/83.  Pt completed UB bathing and dressing tasks seated EOB, requiring 3 rest breaks to complete tasks.  Pt stood from EOB using Stedy X 4 for LB bathing, donning brief, and pulling up pants.  Pt's O2 sats on 2L O2>90% throughout session.  Pt requires extra time to complete all tasks with multiple rest breaks.  Focus on activity tolerance, bed mobility, static and dynamic sitting balance, sit<>stand, standing balance, and safety awareness.  Therapy Documentation Precautions:  Precautions Precautions: Fall Restrictions Weight Bearing Restrictions: No Pain: Pain Assessment Pain Assessment: No/denies pain  See FIM for current functional status  Therapy/Group: Individual Therapy  [name], [name] 08/03/2014, 8:59 AM

## 2014-08-03 NOTE — Progress Notes (Signed)
Speech Language Pathology Daily Session Note  Patient Details  Name: Roy Mitchell MRN: [id number] Date of Birth: 1923/10/05  Today's Date: 08/03/2014 SLP Individual Time: 1030-1120 SLP Individual Time Calculation (min): 50 min  Short Term Goals: Week 1: SLP Short Term Goal 1 (Week 1): Pt will tolerate presentations of his currently prescribed diet with no overt s/s of aspiration with min cues for use of swallowing precautions.   SLP Short Term Goal 2 (Week 1): Pt will tolerate trials of advanced consistencies with no overt s/s of aspiration and min cues for use of swallowing precautions.   SLP Short Term Goal 3 (Week 1): Pt will improve speech intelligibility at the phrase level to ~75% accuracy with mod cues for use of dysarthria strategies.   SLP Short Term Goal 4 (Week 1): Pt will improve functional word finding at the phrase level over 75% of observable opportunities with mod cues for use of compensatory strategies.  SLP Short Term Goal 5 (Week 1): Pt will improve recall of daily information over 75% of observable opportunities with mod cues for use of external aids.   SLP Short Term Goal 6 (Week 1): Pt will improve functional problem solving during basic, structured, therapeutic tasks over 75% of observable opportunities with mod cues.    Skilled Therapeutic Interventions: Skilled treatment focused on swallowing and cognitive goals. SLP facilitated session with skilled observation of advanced trials of thin liquids via cup sips. Pt with delayed coughing, which was noted several minutes after intake had stopped. He took small sips at a controlled rate with supervision level cueing. Questionable increase in SOB following intake, and therefore it may be helpful to observe pt with advanced trials during a full meal. Pt also completed structured 4-step sequencing task with Min cues for accuracy and emergent awareness of phonemic errors. Continue plan of care.   FIM:   Comprehension Comprehension Mode: Auditory Comprehension: 5-Follows basic conversation/direction: With extra time/assistive device Expression Expression Mode: Verbal Expression: 5-Expresses basic 90% of the time/requires cueing < 10% of the time. Social Interaction Social Interaction: 6-Interacts appropriately with others with medication or extra time (anti-anxiety, antidepressant). Problem Solving Problem Solving: 4-Solves basic 75 - 89% of the time/requires cueing 10 - 24% of the time Memory Memory: 4-Recognizes or recalls 75 - 89% of the time/requires cueing 10 - 24% of the time FIM - Eating Eating Activity: 5: Supervision/cues  Pain Pain Assessment Pain Assessment: No/denies pain Pain Score: 0-No pain  Therapy/Group: Individual Therapy   Maxcine HamLaura Paiewonsky, M.A. CCC-SLP [phone]  Maxcine Hamaiewonsky, Orvetta Danielski 08/03/2014, 11:48 AM

## 2014-08-04 ENCOUNTER — Inpatient Hospital Stay (HOSPITAL_COMMUNITY): Payer: Medicare Other | Admitting: Physical Therapy

## 2014-08-04 ENCOUNTER — Ambulatory Visit (HOSPITAL_COMMUNITY): Payer: Medicare Other | Admitting: Rehabilitation

## 2014-08-04 ENCOUNTER — Inpatient Hospital Stay (HOSPITAL_COMMUNITY): Payer: Medicare Other | Admitting: Speech Pathology

## 2014-08-04 ENCOUNTER — Encounter (HOSPITAL_COMMUNITY): Payer: Medicare Other | Admitting: *Deleted

## 2014-08-04 ENCOUNTER — Ambulatory Visit: Payer: Medicare Other | Admitting: Neurology

## 2014-08-04 LAB — GLUCOSE, CAPILLARY
GLUCOSE-CAPILLARY: 120 mg/dL — AB (ref 70–99)
Glucose-Capillary: 93 mg/dL (ref 70–99)
Glucose-Capillary: 102 mg/dL — ABNORMAL HIGH (ref 70–99)
Glucose-Capillary: 89 mg/dL (ref 70–99)

## 2014-08-04 LAB — BASIC METABOLIC PANEL
Anion gap: 9 (ref 5–15)
BUN: 15 mg/dL (ref 6–23)
CHLORIDE: 104 meq/L (ref 96–112)
CO2: 26 mmol/L (ref 19–32)
Calcium: 8.2 mg/dL — ABNORMAL LOW (ref 8.4–10.5)
Creatinine, Ser: 0.6 mg/dL (ref 0.50–1.35)
GFR calc Af Amer: >90 mL/min (ref >90)
GFR calc non Af Amer: 87 mL/min — ABNORMAL LOW (ref >90)
Glucose, Bld: 96 mg/dL (ref 70–99)
Potassium: 3.8 mmol/L (ref 3.5–5.1)
SODIUM: 139 mmol/L (ref 135–145)

## 2014-08-04 MED ORDER — ENSURE PUDDING PO PUDG
1.0000 | Freq: Three times a day (TID) | ORAL | Status: DC
  Administered 2014-08-04 – 2014-08-12 (×19): 1 via ORAL

## 2014-08-04 NOTE — Progress Notes (Addendum)
  I have reviewed and agree with treatment provided as reflected in this note. [name], PT, MPT   Physical Therapy Session Note  Patient Details  Name: Roy KindsSherwood P Straughter MRN: [id number] Date of Birth: August 21, 1923  Today's Date: 08/04/2014 PT Individual Time: 1300-1400 PT Individual Time Calculation (min): 60 min  and Today's Date: 08/04/2014  Short Term Goals: Week 1:  PT Short Term Goal 1 (Week 1): pt will perform supine> sit with mod assist  PT Short Term Goal 2 (Week 1): pt will perform basic transfer with assistance from 1 person PT Short Term Goal 3 (Week 1): pt will propel w/c x 50' with supervision PT Short Term Goal 4 (Week 1): pt will perform gait x 20' with assistance of 2 people PT Short Term Goal 5 (Week 1): pt will negotiate 1 step with assistance of 1 person  Skilled Therapeutic Interventions/Progress Updates:  Co-treat with TR. Pt received seated in w/c ready for therapy. Session focused on functional transfers, wheelchair propulsion, functional mobility and pt education. Throughout session pt was educated on functional transfers and techniques to make pt more independent at time of d/c. Pt transferred w/c<>mat via slide board transfer at modAx1 level with some anxiousness; reported fear of anterior falling. Performed lateral scoot EOM to the R, sit<>supine x2 with modA x1-2 (log rolling technique), B supine rolling with minAx1 and consistent verbal cues for UE/LE placement. Pt demonstrated difficulty with memory of sequencing although reported having learned sit<>supine technique before. Overall activity tolerance appears improved since yesterday.  Pt propelled w/c 125' feet using B UE with supervision and min cues for technique to return to room. Pt left sitting in w/c with wife, daughter and son-in-law in room, and all needs within reach.  Therapy Documentation Precautions:  Precautions Precautions: Fall Restrictions Weight Bearing Restrictions: No  See FIM for  current functional status  Therapy/Group: Co-Treatment with TR  Everlene Otherck, Douglas 08/04/2014, 3:24 PM

## 2014-08-04 NOTE — Progress Notes (Signed)
Occupational Therapy Session Note  Patient Details  Name: Roy Mitchell Niazi MRN: [id number] Date of Birth: July 05, 1924  Today's Date: 08/04/2014 OT Individual Time: 0700-0800 OT Individual Time Calculation (min): 60 min    Short Term Goals: Week 1:  OT Short Term Goal 1 (Week 1): Pt will peform bathing with Max A of 1 person in order to decrease assistance for self care task. OT Short Term Goal 2 (Week 1): Pt will perform LB dressing with Max A of 1 person for clothing management. OT Short Term Goal 3 (Week 1): Pt will perform UB dressing with Min A in order to increase independence in self care. OT Short Term Goal 4 (Week 1): Pt will decrease to min verbal cues for initiation of self care tasks in order to decrease cues required for functional tasks.   Skilled Therapeutic Interventions/Progress Updates:    Pt resting in bed upon arrival and agreeable to participating in therapy.  Pt engaged in BADL retraining including bathing and dressing with sit<>stand from EOB and w/c at sink.  Pt used Stedy to perform sit<>stand and standing for LB bathing and dressing tasks.  Pt initially sat EOB to perform bathing tasks but unable to maintain sitting balance without max A secondary to fatigue.  Pt transferred to w/c with Stedy to complete tasks.  Pt stood X 4 with Stedy for approx 20 seconds each time.  Pt noted with increased fatigue this morning requiring multiple rest breaks.  Pt's O2 sats>90% on 2L O2 throughout session.  Focus on activity tolerance, sit<>stand, BADL retraining, sitting balance, and safety awareness.  Therapy Documentation Precautions:  Precautions Precautions: Fall Restrictions Weight Bearing Restrictions: No Pain: Pain Assessment Pain Assessment: No/denies pain  See FIM for current functional status  Therapy/Group: Individual Therapy  [name], Tayden Duran Chappell 08/04/2014, 8:02 AM

## 2014-08-04 NOTE — Progress Notes (Signed)
NUTRITION FOLLOW UP  INTERVENTION: Continue 30 ml Prostat po TID, each supplement provides 100 kcal and 15 grams of protein.  Continue Ensure Pudding po TID, each supplement provides 170 kcal and 4 grams of protein.  Provide Magic cup TID between meals, each supplement provides 290 kcal and 9 grams of protein.  Encourage adequate PO intake.  NUTRITION DIAGNOSIS: Inadequate PO intake related to decreased appetite as evidenced by meal completion of 50%; onging  Goal: Pt to meet >/= 90% of their estimated nutrition needs; progressing  Monitor:  PO intake, weight trends, labs, I/O's  81 y.o. male  Admitting Dx: Traumatic subdural hematoma with loss of consciousness of 6 hours to 24 hours  ASSESSMENT: Pt with history of DDD, HTN, vertigo, tinnitus,TVAR, oxygen dependent at nights, PAD who sustained a fall 2 days PTA on 06/26/14. CT head done revealing acute left parietal SDH.  Pt was asleep during time of visit. Family present at bedside. Family reports pt's appetite has been improving. Meal completion has been varied from 5-50%. Family reports they have been bringing in food from home from time to time per pt request. Family was encouraged to continue with food from home to help with caloric and protein needs as current po intake has been inadequate. RD to increase supplements to additionally aid in nutrition needs.   Labs: Low calcium and GFR.  Height: Ht Readings from Last 1 Encounters:  07/28/14 5\' 6"  (1.676 m)    Weight: Wt Readings from Last 1 Encounters:  07/29/14 173 lb 8 oz (78.7 kg)    BMI:  Body mass index is 28.02 kg/(m^2).  Re-Estimated Nutritional Needs: Kcal: 1900-2050 Protein: 85-95 grams Fluid: 1.9 - 2 L/day  Skin: incision on head, +1 LE edema  Diet Order: DIET DYS 3 with nectar thick liquids   Intake/Output Summary (Last 24 hours) at 08/04/14 1534 Last data filed at 08/04/14 1300  Gross per 24 hour  Intake 1031.67 ml  Output    450 ml  Net  581.67 ml    Last BM: 1/18  Labs:   Recent Labs Lab 07/29/14 0447 07/30/14 0958 08/04/14 0625  NA 135 135 139  K 5.2* 4.8 3.8  CL 99 99 104  CO2 33* 32 26  BUN 21 20 15   CREATININE 0.68 0.88 0.60  CALCIUM 8.7 9.0 8.2*  GLUCOSE 88 148* 96    CBG (last 3)   Recent Labs  08/03/14 2105 08/04/14 0650 08/04/14 1119  GLUCAP 100* 93 102*    Scheduled Meds: . sodium chloride   Intravenous q1800  . acetylcysteine  2 mL Nebulization BID  . antiseptic oral rinse  7 mL Mouth Rinse q12n4p  . budesonide (PULMICORT) nebulizer solution  0.5 mg Nebulization BID  . chlorhexidine  15 mL Mouth Rinse BID  . feeding supplement (ENSURE)  1 Container Oral BID BM  . feeding supplement (PRO-STAT SUGAR FREE 64)  30 mL Oral TID WC  . finasteride  5 mg Oral QHS  . insulin aspart  0-5 Units Subcutaneous QHS  . insulin aspart  0-9 Units Subcutaneous TID WC  . levETIRAcetam  250 mg Oral BID  . metFORMIN  500 mg Oral BID WC  . metoprolol tartrate  25 mg Oral BID  . mirtazapine  15 mg Oral QHS  . modafinil  100 mg Oral Daily  . omega-3 acid ethyl esters  1 g Oral BID  . predniSONE  5 mg Oral Q breakfast    Continuous Infusions:   Past  Medical History  Diagnosis Date  . Hypercholesterolemia   . Benign prostatic hypertrophy   . GERD (gastroesophageal reflux disease)   . DDD (degenerative disc disease), cervical   . Tinnitus   . Multinodular goiter   . Prediabetes   . DDD (degenerative disc disease), lumbar   . Hypertension   . Pulmonary nodules   . Anxiety disorder     Past Surgical History  Procedure Laterality Date  . Aortic valve replacement  01/2014  . History of blood transfusion    . Hernia repair      6 months old  . Craniotomy Left 06/26/2014    Procedure: Left Parietal Craniotomy for Subdural Hematoma;  Surgeon: Barnett Abu, MD;  Location: [facility] NEURO ORS;  Service: Neurosurgery;  Laterality: Left;    Marijean Niemann, MS, RD, LDN Pager # [phone] After hours/ weekend  pager # [phone]

## 2014-08-04 NOTE — Progress Notes (Signed)
[facility] PHYSICAL MEDICINE & REHABILITATION     PROGRESS NOTE    Subjective/Complaints: Slept well. No complaints. Denies pain, cough, sob A  review of systems has been performed and if not noted above is otherwise negative. Patient Insight is reduced however   Objective: Vital Signs: Blood pressure 126/66, pulse 88, temperature 98.3 F (36.8 C), temperature source Oral, resp. rate 18, height  (1.676 m), weight 78.7 kg (173 lb 8 oz), SpO2 97 %. No results found. No results for input(s): WBC, HGB, HCT, PLT in the last 72 hours.  Recent Labs  08/04/14 0625  NA 139  K 3.8  CL 104  GLUCOSE 96  BUN 15  CREATININE 0.60  CALCIUM 8.2*   CBG (last 3)   Recent Labs  08/03/14 1716 08/03/14 2105 08/04/14 0650  GLUCAP 92 100* 93    Wt Readings from Last 3 Encounters:  07/29/14 78.7 kg (173 lb 8 oz)  07/08/14 84.3 kg (185 lb 13.6 oz)  04/30/14 89.585 kg (197 lb 8 oz)    Physical Exam:  Constitutional: He is oriented to person, place, and time. He appears well-developed and well-nourished.  HENT:  Head: Normocephalic and atraumatic.  Well healed left crani incision.  Eyes: Conjunctivae are normal. Pupils are equal, round, and reactive to light.  Neck: Normal range of motion. Neck supple.  Cardiovascular: Normal rate and regular rhythm.  No murmur heard. Respiratory: appears more comfortable. He has occasional wheezes and scattered rhonchi which are improved.  GI: Soft. Bowel sounds are normal. He exhibits no distension. There is no tenderness.  Musculoskeletal: He exhibits no edema or tenderness.  Neurological: He is alert and oriented to person, place, and time.  Continued dysarthria and dysphonia with expressive deficits. Decreased oro-motor control---but better.  apraxia noted with MMT. UE grossly 4/5 prox to distal. LE: 2+ hf, 3 ke and 4/5 ankle dorsi and plantarflexion. Sensory exam grossly intact Skin: Skin is warm and dry. Some scattered  ecchymoses.   Psych: pt pleasant and generally appropriate  Assessment/Plan: 1. Functional deficits secondary to left SDH complicated by deconditioning from multiple medical issues which require 3+ hours per day of interdisciplinary therapy in a comprehensive inpatient rehab setting. Physiatrist is providing close team supervision and 24 hour management of active medical problems listed below. Physiatrist and rehab team continue to assess barriers to discharge/monitor patient progress toward functional and medical goals. FIM: FIM - Bathing Bathing Steps Patient Completed: Chest, Right Arm, Left Arm, Abdomen Bathing: 2: Max-Patient completes 3-4 33f 10 parts or 25-49%  FIM - Upper Body Dressing/Undressing Upper body dressing/undressing steps patient completed: Thread/unthread right sleeve of pullover shirt/dresss, Thread/unthread left sleeve of pullover shirt/dress, Put head through opening of pull over shirt/dress Upper body dressing/undressing: 4: Min-Patient completed 75 plus % of tasks FIM - Lower Body Dressing/Undressing Lower body dressing/undressing: 1: Two helpers  FIM - Toileting Toileting steps completed by patient: Performs perineal hygiene Toileting: 1: Total-Patient completed zero steps, helper did all 3  FIM - Archivist Transfers: 1-Two helpers (per [facility], NT)  FIM - Bed/Chair Transfer Bed/Chair Transfer Assistive Devices: Arm rests Bed/Chair Transfer: 4: Supine > Sit: Min A (steadying Pt. > 75%/lift 1 leg), 1: Mechanical lift  FIM - Locomotion: Wheelchair Distance: 10' Locomotion: Wheelchair: 2: Travels 50 - 149 ft with supervision, cueing or coaxing FIM - Locomotion: Ambulation Locomotion: Ambulation Assistive Devices: Parallel bars Ambulation/Gait Assistance: Not tested (comment) Locomotion: Ambulation: 0: Activity did not occur  Comprehension Comprehension Mode: Auditory  Comprehension: 5-Follows basic conversation/direction: With extra  time/assistive device  Expression Expression Mode: Verbal Expression: 5-Expresses basic 90% of the time/requires cueing < 10% of the time.  Social Interaction Social Interaction: 6-Interacts appropriately with others with medication or extra time (anti-anxiety, antidepressant).  Problem Solving Problem Solving: 4-Solves basic 75 - 89% of the time/requires cueing 10 - 24% of the time  Memory Memory: 4-Recognizes or recalls 75 - 89% of the time/requires cueing 10 - 24% of the time Medical Problem List and Plan: 1. Functional deficits secondary to Left SDH complicated by acute respiratory failure 2. DVT Prophylaxis/Anticoagulation: Mechanical: Sequential compression devices, below knee Bilateral lower extremities 3. Pain Management: tylenol prn 4. Mood: Continue remeron at bedtime. LCSW to follow for evaluation and support.  5. Neuropsych: This patient is not capable of making decisions on his own behalf. 6. Skin/Wound Care: Routine pressure relief measures.  7. Fluids/Electrolytes/Nutrition: Monitor I/O.    - Continue pro stat supplement tid. Offer ensure pudding or magic cup between meals.  -encourage PO   -BMET within normal limits today 8. Hematuria: Has h/o BPH--sees Dr. Saddie Bendershao in [place]. Continue flomax and finasteride. Monitor voiding with PVR check. Monitor H/H.   -HGB trending up 9. Reactive airway disease: Was on nocturnal oxygen with nebs bid PTA.   -follow up cxr stable to improved  -added mucomyst to help with secretions which seems to have helped  -decreased prednisone to  5 mg daily   -IS, coughing, OOB 10. HTN: Will monitor BP every 8 hours. Continue metoprolol bid.  11. Seizure prophylaxis: Will continue Keppra bid 12. Acute respiratory failure: Continue budesonide nebs bid. Encourage IS every 4 hours while awake.   -oob, encouraged him to cough up secretions, mucomyst    LOS (Days) 7 A FACE TO FACE EVALUATION WAS PERFORMED  SWARTZ,ZACHARY T 08/04/2014  8:24 AM

## 2014-08-04 NOTE — Progress Notes (Signed)
Physical Therapy Session Note  Patient Details  Name: Roy Mitchell P Flores MRN: [id number] Date of Birth: Aug 25, 1923  Today's Date: 08/04/2014 PT Individual Time: [id number] PT Individual Time Calculation (min): 40 min   Short Term Goals: Week 1:  PT Short Term Goal 1 (Week 1): pt will perform supine> sit with mod assist  PT Short Term Goal 2 (Week 1): pt will perform basic transfer with assistance from 1 person PT Short Term Goal 3 (Week 1): pt will propel w/c x 50' with supervision PT Short Term Goal 4 (Week 1): pt will perform gait x 20' with assistance of 2 people PT Short Term Goal 5 (Week 1): pt will negotiate 1 step with assistance of 1 person  Skilled Therapeutic Interventions/Progress Updates:   Pt received in w/c with supplemental 02 in place and family present.  Pt denies pain and willing to participate.  Pt participated in w/c mobility training with trial of room air x 50' with bilat UE propulsion and supervision but verbal cues for sequencing turns and for repositioning of pelvis in the w/c for improved joint positioning for more efficient propulsion.  Performed sit <> stand x 2 in [place] with pt maintaining first stand for 30-45 seconds; during scooting to prepare for sit > stand and during standing pt required total verbal cues for initiation and motor planning sequence with pt verbalizing fear/anxiety of falling with resultant increased RR; pt able to maintain Sp02 >95% on RA during standing.  Returned to sitting on Stedy for NMR and postural control training; see below.  Pt fatigued after session; returned to room in w/c total A and returned to wall 02; pt to rest in w/c with family present.    Therapy Documentation Precautions:  Precautions Precautions: Fall Restrictions Weight Bearing Restrictions: No Vital Signs: Therapy Vitals Pulse Rate: 83 Oxygen Therapy SpO2: 97 % O2 Device: Not Delivered O2 Flow Rate (L/min): 2 L/min Pulse Oximetry Type: Intermittent Pain: Pain  Assessment Pain Assessment: No/denies pain Faces Pain Scale: No hurt Pain Type: Acute pain Pain Location: Head Pain Descriptors / Indicators: Aching Pain Intervention(s): Medication (See eMAR) Locomotion : Wheelchair Mobility Distance: 50  Other Treatments: Treatments Neuromuscular Facilitation: Right;Left;Activity to increase motor control;Activity to increase timing and sequencing;Activity to increase sustained activation;Activity to increase lateral weight shifting;Activity to increase anterior-posterior weight shifting seated on Stedy for increased postural control training/core activation during R and LUE reaching up, forwards and laterally out of BOS x 7 reps with min A and tactile and verbal cues for upright head/trunk and trunk shortening and lengthening during weight shifting; pt very fearful at first but became more comfortable reaching out of BOS with practice.    See FIM for current functional status  Therapy/Group: Individual Therapy  Edman CircleHall, Lashya Passe [facility] Hospital[facility]aucette 08/04/2014, 9:58 AM

## 2014-08-04 NOTE — Progress Notes (Signed)
Speech Language Pathology Daily Session Note  Patient Details  Name: Roy Mitchell MRN: [id number] Date of Birth: 04/16/1924  Today's Date: 08/04/2014 SLP Individual Time: [id number] SLP Individual Time Calculation (min): 33 min  Short Term Goals: Week 1: SLP Short Term Goal 1 (Week 1): Pt will tolerate presentations of his currently prescribed diet with no overt s/s of aspiration with min cues for use of swallowing precautions.   SLP Short Term Goal 2 (Week 1): Pt will tolerate trials of advanced consistencies with no overt s/s of aspiration and min cues for use of swallowing precautions.   SLP Short Term Goal 3 (Week 1): Pt will improve speech intelligibility at the phrase level to ~75% accuracy with mod cues for use of dysarthria strategies.   SLP Short Term Goal 4 (Week 1): Pt will improve functional word finding at the phrase level over 75% of observable opportunities with mod cues for use of compensatory strategies.  SLP Short Term Goal 5 (Week 1): Pt will improve recall of daily information over 75% of observable opportunities with mod cues for use of external aids.   SLP Short Term Goal 6 (Week 1): Pt will improve functional problem solving during basic, structured, therapeutic tasks over 75% of observable opportunities with mod cues.    Skilled Therapeutic Interventions:  Pt was seen for skilled ST targeting goals for dysphagia and language.  SLP facilitated the session with min cues for initiation and sequencing of oral care prior to PO trials of regular water.  Following thorough oral care, pt consumed cup sips of regular water with no overt s/s of aspiration and supervision cues for slow controlled cup sips.  Following completion of PO trials, pt initiated request for further "speech suggestions" to maximize functional communication in between therapy sessions.  SLP encouraged pt to utilize slow rate to facilitate increased processing time for word finding and use of description.  Pt  continues to make good progress towards goals.  Continue per current plan of care.      FIM:  Comprehension Comprehension Mode: Auditory Comprehension: 5-Understands basic 90% of the time/requires cueing < 10% of the time Expression Expression Mode: Verbal Expression: 3-Expresses basic 50 - 74% of the time/requires cueing 25 - 50% of the time. Needs to repeat parts of sentences. Social Interaction Social Interaction: 5-Interacts appropriately 90% of the time - Needs monitoring or encouragement for participation or interaction. Problem Solving Problem Solving Mode: Not assessed Problem Solving: 3-Solves basic 50 - 74% of the time/requires cueing 25 - 49% of the time Memory Memory: 3-Recognizes or recalls 50 - 74% of the time/requires cueing 25 - 49% of the time FIM - Eating Eating Activity: 5: Supervision/cues  Pain Pain Assessment Pain Assessment: No/denies pain  Therapy/Group: Individual Therapy  Vinod Mikesell, [name] 08/04/2014, 4:55 PM

## 2014-08-04 NOTE — Progress Notes (Signed)
Patient resting quietly in bed at this time, O2@2LNC  in place. Previous in shift, patient AAOX4, slightly delayed, appropriate verbal responses noted. Denied pain/or needs when asked. IV to RFA intact, site w/o S/S infection, 0.9%NS @ 50/hr. Lung sounds diminished, audible rhonchi noted. BLE with 1+ edema noted, pulses palpable. Able to make needs known, call light in reach.  Dorna LeitzWorthington, Lorane Cousar, RN

## 2014-08-04 NOTE — Discharge Instructions (Signed)
Inpatient Rehab Discharge Instructions  Myna HidalgoSherwood P Buzzelli Discharge date and time:    Activities/Precautions/ Functional Status: Activity: activity as tolerated Diet:  Wound Care: keep wound clean and dry   Functional status:  ___ No restrictions     ___ Walk up steps independently _X__ 24/7 supervision/assistance   ___ Walk up steps with assistance ___ Intermittent supervision/assistance  ___ Bathe/dress independently ___ Walk with walker     ___ Bathe/dress with assistance ___ Walk Independently    ___ Shower independently ___ Walk with assistance    ___ Shower with assistance ___ No alcohol     ___ Return to work/school ________  Special Instructions:    My questions have been answered and I understand these instructions. I will adhere to these goals and the provided educational materials after my discharge from the hospital.  Patient/Caregiver Signature _______________________________ Date __________  Clinician Signature _______________________________________ Date __________  Please bring this form and your medication list with you to all your follow-up doctor's appointments.

## 2014-08-05 ENCOUNTER — Inpatient Hospital Stay (HOSPITAL_COMMUNITY): Payer: Medicare Other | Admitting: *Deleted

## 2014-08-05 ENCOUNTER — Encounter (HOSPITAL_COMMUNITY): Payer: Medicare Other | Admitting: *Deleted

## 2014-08-05 ENCOUNTER — Inpatient Hospital Stay (HOSPITAL_COMMUNITY): Payer: Medicare Other

## 2014-08-05 LAB — GLUCOSE, CAPILLARY
GLUCOSE-CAPILLARY: 122 mg/dL — AB (ref 70–99)
GLUCOSE-CAPILLARY: 127 mg/dL — AB (ref 70–99)
Glucose-Capillary: 96 mg/dL (ref 70–99)
Glucose-Capillary: 109 mg/dL — ABNORMAL HIGH (ref 70–99)

## 2014-08-05 MED ORDER — MEGESTROL ACETATE 400 MG/10ML PO SUSP
400.0000 mg | Freq: Every day | ORAL | Status: DC
  Administered 2014-08-05 – 2014-08-13 (×9): 400 mg via ORAL
  Filled 2014-08-05 (×12): qty 10

## 2014-08-05 NOTE — Progress Notes (Signed)
Patient called out, C/O "just threw up everywhere." Patient noted with emesis to pillow and gown. As this nurse entered room, patient began coughing and had additional emesis, yellow/green colored (bile) with partially digested food present. PRN PO zofran retreived, patient actively throwing up continues, wasted PO zofran, administered IM zofran to L thigh, with positive results. Held scheduled medications until later due to emesis. Patient diaphoretic, slightly tachycardic at this time. Will continue to monitor.  Dorna LeitzWorthington, Albaro Deviney

## 2014-08-05 NOTE — Progress Notes (Signed)
Speech Language Pathology Weekly Progress Note  Patient Details  Name: Roy Mitchell MRN: [id number] Date of Birth: 07/23/1923  Beginning of progress report period: July 28, 2014 End of progress report period: August 05, 2014   Short Term Goals: Week 1: SLP Short Term Goal 1 (Week 1): Pt will tolerate presentations of his currently prescribed diet with no overt s/s of aspiration with min cues for use of swallowing precautions.   SLP Short Term Goal 1 - Progress (Week 1): Met SLP Short Term Goal 2 (Week 1): Pt will tolerate trials of advanced consistencies with no overt s/s of aspiration and min cues for use of swallowing precautions.   SLP Short Term Goal 2 - Progress (Week 1): Met SLP Short Term Goal 3 (Week 1): Pt will improve speech intelligibility at the phrase level to ~75% accuracy with mod cues for use of dysarthria strategies.   SLP Short Term Goal 3 - Progress (Week 1): Met SLP Short Term Goal 4 (Week 1): Pt will improve functional word finding at the phrase level over 75% of observable opportunities with mod cues for use of compensatory strategies.  SLP Short Term Goal 4 - Progress (Week 1): Met SLP Short Term Goal 5 (Week 1): Pt will improve recall of daily information over 75% of observable opportunities with mod cues for use of external aids.   SLP Short Term Goal 5 - Progress (Week 1): Met SLP Short Term Goal 6 (Week 1): Pt will improve functional problem solving during basic, structured, therapeutic tasks over 75% of observable opportunities with mod cues.   SLP Short Term Goal 6 - Progress (Week 1): Progressing toward goal    New Short Term Goals: Week 2: SLP Short Term Goal 1 (Week 2): Pt will tolerate presentations of his currently prescribed diet with no overt s/s of aspiration with supervision cues for use of swallowing precautions.   SLP Short Term Goal 2 (Week 2): Pt will tolerate trials of regular water with no overt s/s of aspiration with supervision cues for  use of swallowing precautions.  SLP Short Term Goal 3 (Week 2): Pt will improve speech intelligibility at the phrase level to >80 % accuracy with min cues for use of dysarthria strategies.   SLP Short Term Goal 4 (Week 2): Pt will improve functional word finding at the phrase level over 75% of observable opportunities with min cues for use of compensatory strategies.  SLP Short Term Goal 5 (Week 2): Pt will improve functional problem solving during basic, structured, therapeutic tasks over 75% of observable opportunities with mod cues.    Weekly Progress Updates:  Pt made functional gains and has met 5 out of 6 short term goals this reporting period.  Currently, pt requires mod assist for cognitive-linguistic tasks and is utilizing his recommended swallowing precautions to minimize overt s/s of aspiration with dys 3 solids and nectar thick liquids.  Pt and family education is ongoing.  Pt would continue to benefit from skilled ST while inpatient in order to maximize functional independence and reduce burden of care prior to discharge.  Continue to anticipate that pt will require 24/7 supervision at discharge as well as ST follow up at next level of care.     Intensity: Minumum of 1-2 x/day, 30 to 90 minutes Frequency: 5 out of 7 days Duration/Length of Stay: 21-28 days  Treatment/Interventions: Cognitive remediation/compensation;Cueing hierarchy;Dysphagia/aspiration precaution training;Functional tasks;Patient/family education;Therapeutic Activities;Speech/Language facilitation;Oral motor exercises;Multimodal communication approach;Internal/external aids;Environmental controls     Marinell Igarashi, Melanee Spry  08/05/2014, 9:44 PM

## 2014-08-05 NOTE — Progress Notes (Addendum)
Physical Therapy Session Note  Patient Details  Name: Theophilus KindsSherwood P Hurlbutt MRN: [id number] Date of Birth: 1923-09-10  Today's Date: 08/05/2014 PT Individual Time: 0921-1000 PT Individual Time Calculation (min): 39 min   Short Term Goals: Week 1:  PT Short Term Goal 1 (Week 1): pt will perform supine> sit with mod assist  PT Short Term Goal 2 (Week 1): pt will perform basic transfer with assistance from 1 person PT Short Term Goal 3 (Week 1): pt will propel w/c x 50' with supervision PT Short Term Goal 4 (Week 1): pt will perform gait x 20' with assistance of 2 people PT Short Term Goal 5 (Week 1): pt will negotiate 1 step with assistance of 1 person  Skilled Therapeutic Interventions/Progress Updates:    Patient received sitting in wheelchair, respiratory therapy present administering breathing treatment. Patient missed first 21 minutes of session secondary to breathing treatment. Session focused on B LE strengthening and activity tolerance through repeated sit<>stands, prolonged perch position to increase weight bearing through B LEs, prolonged standing with progressive decrease in UE support in [place]. Patient performed prolonged perch position in [place], progressing to repeated modified sit<>stands in upper range, prolonged standing with B UE support> unilateral UE support to perform UE ball taps; minA overall. Patient requires prolonged and frequent rest breaks between activity.   Patient with reports of needing to use bathroom, returned to room and transferred wheelchair>toilet with use of grab bars and modA, totalA for clothing management. Patient left sitting on toilet to have BM, RN present for supervision.  Therapy Documentation Precautions:  Precautions Precautions: Fall Restrictions Weight Bearing Restrictions: No General: PT Amount of Missed Time (min): 21 Minutes PT Missed Treatment Reason: Unavailable (Comment) (respiratory breathing treatment) Vital Signs: Oxygen Therapy SpO2:  99 % O2 Device: Nasal Cannula O2 Flow Rate (L/min): 2 L/min Pain: Pain Assessment Pain Assessment: 0-10 Pain Score: 5  Faces Pain Scale: No hurt Pain Type: Acute pain Pain Location: Head Pain Descriptors / Indicators: Headache Pain Onset: Gradual Pain Intervention(s): Medication (See eMAR) Locomotion : Ambulation Ambulation/Gait Assistance: Not tested (comment) Wheelchair Mobility Distance: 100   See FIM for current functional status  Therapy/Group: Individual Therapy and Co-Treatment for second half of session with Rec Therapy  Chipper HerbBridget S Shemeika Starzyk S. Virlee Stroschein, PT, DPT 08/05/2014, 10:58 AM

## 2014-08-05 NOTE — Progress Notes (Signed)
Speech Language Pathology Daily Session Note  Patient Details  Name: Roy Mitchell MRN: [id number] Date of Birth: [birth year]  Today's Date: 08/05/2014 SLP Individual Time: 1000-1100 SLP Individual Time Calculation (min): 60 min  Short Term Goals: Week 1: SLP Short Term Goal 1 (Week 1): Pt will tolerate presentations of his currently prescribed diet with no overt s/s of aspiration with min cues for use of swallowing precautions.   SLP Short Term Goal 2 (Week 1): Pt will tolerate trials of advanced consistencies with no overt s/s of aspiration and min cues for use of swallowing precautions.   SLP Short Term Goal 3 (Week 1): Pt will improve speech intelligibility at the phrase level to ~75% accuracy with mod cues for use of dysarthria strategies.   SLP Short Term Goal 4 (Week 1): Pt will improve functional word finding at the phrase level over 75% of observable opportunities with mod cues for use of compensatory strategies.  SLP Short Term Goal 5 (Week 1): Pt will improve recall of daily information over 75% of observable opportunities with mod cues for use of external aids.   SLP Short Term Goal 6 (Week 1): Pt will improve functional problem solving during basic, structured, therapeutic tasks over 75% of observable opportunities with mod cues.    Skilled Therapeutic Interventions: Skilled treatment focused on cognitive-linguistic and speech goals. SLP facilitated session with supervision level question cues for recall of word-finding strategies introduced on previous date. Pt participated in structured task in mildly noisy environment with Min cues for use of speech intelligibility strategies, and Mod cues for use of word-finding techniques and basic problem solving. Pt communicated at the conversational level throughout session with Mod cues for increased intelligibility.   FIM:  Comprehension Comprehension Mode: Auditory Comprehension: 5-Follows basic conversation/direction: With no  assist Expression Expression Mode: Verbal Expression: 4-Expresses basic 75 - 89% of the time/requires cueing 10 - 24% of the time. Needs helper to occlude trach/needs to repeat words. Social Interaction Social Interaction: 5-Interacts appropriately 90% of the time - Needs monitoring or encouragement for participation or interaction. Problem Solving Problem Solving: 4-Solves basic 75 - 89% of the time/requires cueing 10 - 24% of the time Memory Memory: 3-Recognizes or recalls 50 - 74% of the time/requires cueing 25 - 49% of the time  Pain Pain Assessment Pain Assessment: No/denies pain  Therapy/Group: Individual Therapy   Maxcine HamLaura Paiewonsky, M.A. CCC-SLP [phone]  Maxcine Hamaiewonsky, Gianlucca Szymborski 08/05/2014, 1:33 PM

## 2014-08-05 NOTE — Progress Notes (Signed)
No further emesis at this time. Patient sitting up in bad, took scheduled medications late, but without incident. No further complaints of N/V. Medicated with PO tylenol per patient request, slight headache, "flushed feeling". Slightly elevated oral temperature.Resting quietly at this time, resp easy and regular. Will continue to monitor. Dorna LeitzWorthington, Tyianna Menefee, RN

## 2014-08-05 NOTE — Patient Care Conference (Signed)
Inpatient RehabilitationTeam Conference and Plan of Care Update Date: 08/05/2014   Time: 2:45 PM    Patient Name: Roy Mitchell      Medical Record Number: [id number]  Date of Birth: [birth year] Sex: Male         Room/Bed: 4W18C/4W18C-01 Payor Info: Payor: MEDICARE / Plan: MEDICARE PART A AND B / Product Type: *No Product type* /    Admitting Diagnosis: L PARIETAL SDH  CRANI  Admit Date/Time:  07/28/2014  3:48 PM Admission Comments: No comment available   Primary Diagnosis:  Traumatic subdural hematoma with loss of consciousness of 6 hours to 24 hours Principal Problem: Traumatic subdural hematoma with loss of consciousness of 6 hours to 24 hours  Patient Active Problem List   Diagnosis Date Noted  . Mild reactive airways disease 07/29/2014  . Dysphagia, pharyngoesophageal phase 07/29/2014  . DNR (do not resuscitate) 07/04/2014  . Lung collapse   . Acute respiratory failure with hypoxia 06/27/2014  . Acute respiratory failure   . Traumatic subdural hematoma with loss of consciousness of 6 hours to 24 hours 06/26/2014  . Abnormality of gait 06/26/2014  . HTN (hypertension) 06/26/2014  . Anxiety and depression 06/26/2014    Expected Discharge Date: Expected Discharge Date: 08/25/14  Team Members Present: Physician leading conference: Dr. Faith RogueZachary Swartz Social Worker Present: Amada JupiterLucy Hoyle, LCSW Nurse Present: Carlean PurlMaryann Barbour, RN PT Present: Cyndia SkeetersBridgett Ripa, PT OT Present: Ardis Rowanom [name], Darolyn RuaOTA;Kayla Perkinson, OT SLP Present: Other (comment) Michiel Cowboy(Laura Paiwonsky, SLP) Other (Discipline and Name): Ottie GlazierBarbara Boyette, RN [facility]) PPS Coordinator present : Tora DuckMarie Noel, RN, [facility]     Current Status/Progress Goal Weekly Team Focus  Medical   deconditioning, SDH, recurrent pneumonia. fair intake  improve activity tolerance  nutrition, respriatory control   Bowel/Bladder   Pt continent of bowel LBM 1-18. Pt cont. of bladder during the day with timed toileting. condom cath used HS. very low intake and  output  manage b/b mod assist  cont. timed toileting during the day.    Swallow/Nutrition/ Hydration   Min A with Dys 3/nectar  supervision  advanced trials   ADL's   bathing-mod A; UB dressing-min A; LB dressing-tot A; transfers-tot A; limited activity tolerance; decreased sitting balance  min A overall  activity tolerance, sitting balance, functional transfers, sit<>stand, standing balance,   Mobility   Min-mod bed mobility; Mod-max A for all transfers; supervision for w/c propulsion; mod A of x1-2 for standing;. Pt with overall very poor functional endurance, unable to safely progress to ambulation and stiar negotiation at this time.  LTG requiring standing mobility downgraded due to poor activity tolerance. Mod I for sitting balance; supervision for w/c propulsion; min A for standing balance, bed mobility, transfers; mod A for amubulation in home environment   Activity tolerance, functional transfers, w/c propulsion, strength, balance, breathing, family education   Communication   Min  Min (may need advancement)  self-correction and monitoring   Safety/Cognition/ Behavioral Observations  Min  Min (may need advancement)  basic problem soilving, emergent awareness   Pain   headache at times. PRN tylenol 650 q4hrs.   3 or less  assess pain and medicate as needed.    Skin   reddened scarum with EPBC applied, scattered bruising.   remain free of skin breakdown with min assist  assess skin q shift, provide pressure relief and boosting while in chair, turn q2-3 hrs while in bed    Rehab Goals Patient on target to meet rehab goals: Yes Rehab Goals Revised: ambulation  goals were changed to moderate assistance;  other goals are minimal assistance from wheelchair *See Care Plan and progress notes for long and short-term goals.  Barriers to Discharge: substantial weakness, ?premorbid deficits    Possible Resolutions to Barriers:  contineud therapy, strengthening    Discharge  Planning/Teaching Needs:  Pt plans to go to his home with his wife and she will hire private duty caregivers as needed to help with pt's physical care.  Pt's wife is present daily and can be available for family education.  Wife is welcome to have private duty caregivers come for family education, as well.   Team Discussion:  Pt is chronically deconditioned, but his pulmonary status is improving.  Team to continue to monitor pt's fluid and food intake.  Pt's goals are minimal assistance overall from a wheelchair.  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  Revisions to Treatment Plan:  none   Continued Need for Acute Rehabilitation Level of Care: The patient requires daily medical management by a physician with specialized training in physical medicine and rehabilitation for the following conditions: Daily direction of a multidisciplinary physical rehabilitation program to ensure safe treatment while eliciting the highest outcome that is of practical value to the patient.: Yes Daily medical management of patient stability for increased activity during participation in an intensive rehabilitation regime.: Yes Daily analysis of laboratory values and/or radiology reports with any subsequent need for medication adjustment of medical intervention for : Post surgical problems;Neurological problems  William Schake, Vista Deck 08/05/2014, 9:55 AM

## 2014-08-05 NOTE — Progress Notes (Signed)
Occupational Therapy Session Note  Patient Details  Name: Roy Mitchell MRN: [id number] Date of Birth: [birth year]  Today's Date: 08/05/2014 OT Individual Time: 0700-0800 OT Individual Time Calculation (min): 60 min    Short Term Goals: Week 1:  OT Short Term Goal 1 (Week 1): Pt will peform bathing with Max A of 1 person in order to decrease assistance for self care task. OT Short Term Goal 2 (Week 1): Pt will perform LB dressing with Max A of 1 person for clothing management. OT Short Term Goal 3 (Week 1): Pt will perform UB dressing with Min A in order to increase independence in self care. OT Short Term Goal 4 (Week 1): Pt will decrease to min verbal cues for initiation of self care tasks in order to decrease cues required for functional tasks.   Skilled Therapeutic Interventions/Progress Updates:    Pt resting in bed upon arrival and agreeable to participating in therapy this morning.  Pt performed supine->sit EOB using bed rails with min A.  Pt c/o increased dizziness and required sitting for approx 2 mins before continuing.  Pt stated he has history of vertigo ("my rocks get out of place"). Pt performed sit<>stand using [place] with min a for transfer to w/c for bathing and dressing tasks.  Pt stood X 4 with Stedy to pull up on to facilitate bathing buttocks and donning brief and pants.  Pt incontinent of bowel X 1 with initial sit<>stand.  Pt required tot A for hygiene and pulling up pants.  Pt requires extra time to complete all tasks with multiple rest breaks between task segments.  Pt's O2 sats>90% on 2L RA.  Pt noted with SOB during/after tasks. Focus on activity tolerance, sit<>stand, standing balance, and safety awareness.  Therapy Documentation Precautions:  Precautions Precautions: Fall Restrictions Weight Bearing Restrictions: No Pain: Pain Assessment Pain Assessment: 0-10 Pain Score: 5  Pain Type: Acute pain Pain Location: Head Pain Descriptors / Indicators:  Headache Pain Onset: Gradual Pain Intervention(s): RN made aware  See FIM for current functional status  Therapy/Group: Individual Therapy  [name], [name] 08/05/2014, 8:03 AM

## 2014-08-05 NOTE — Progress Notes (Signed)
Social Work Patient ID: Roy KindsSherwood P Wyche, male   DOB: April 15, 1924, 81 y.o.   MRN: [id number]   Vista DeckJennifer Capps Reilly Molchan, LCSW Social Worker Signed  Patient Care Conference 08/05/2014  9:55 AM    Expand All Collapse All   Inpatient RehabilitationTeam Conference and Plan of Care Update Date: 08/05/2014   Time: 2:45 PM     Patient Name: Roy Mitchell      Medical Record Number: [id number]  Date of Birth: April 15, 1924 Sex: Male         Room/Bed: 4W18C/4W18C-01 Payor Info: Payor: MEDICARE / Plan: MEDICARE PART A AND B / Product Type: *No Product type* /    Admitting Diagnosis: L PARIETAL SDH  CRANI   Admit Date/Time:  07/28/2014  3:48 PM Admission Comments: No comment available   Primary Diagnosis:  Traumatic subdural hematoma with loss of consciousness of 6 hours to 24 hours Principal Problem: Traumatic subdural hematoma with loss of consciousness of 6 hours to 24 hours    Patient Active Problem List     Diagnosis  Date Noted   .  Mild reactive airways disease  07/29/2014   .  Dysphagia, pharyngoesophageal phase  07/29/2014   .  DNR (do not resuscitate)  07/04/2014   .  Lung collapse     .  Acute respiratory failure with hypoxia  06/27/2014   .  Acute respiratory failure     .  Traumatic subdural hematoma with loss of consciousness of 6 hours to 24 hours  06/26/2014   .  Abnormality of gait  06/26/2014   .  HTN (hypertension)  06/26/2014   .  Anxiety and depression  06/26/2014     Expected Discharge Date: Expected Discharge Date: 08/25/14  Team Members Present: Physician leading conference: Dr. Faith RogueZachary Swartz Social Worker Present: Amada JupiterLucy Hoyle, LCSW Nurse Present: Carlean PurlMaryann Barbour, RN PT Present: Cyndia SkeetersBridgett Ripa, PT OT Present: Ardis Rowanom [name], Darolyn RuaOTA;Kayla Perkinson, OT SLP Present: Other (comment) Michiel Cowboy(Laura Paiwonsky, SLP) Other (Discipline and Name): Ottie GlazierBarbara Boyette, RN [facility]) PPS Coordinator present : Tora DuckMarie Noel, RN, [facility]        Current Status/Progress  Goal  Weekly Team Focus   Medical    deconditioning, SDH, recurrent pneumonia. fair intake   improve activity tolerance  nutrition, respriatory control   Bowel/Bladder     Pt continent of bowel LBM 1-18. Pt cont. of bladder during the day with timed toileting. condom cath used HS. very low intake and output   manage b/b mod assist  cont. timed toileting during the day.     Swallow/Nutrition/ Hydration     Min A with Dys 3/nectar  supervision  advanced trials   ADL's     bathing-mod A; UB dressing-min A; LB dressing-tot A; transfers-tot A; limited activity tolerance; decreased sitting balance   min A overall  activity tolerance, sitting balance, functional transfers, sit<>stand, standing balance,   Mobility     Min-mod bed mobility; Mod-max A for all transfers; supervision for w/c propulsion; mod A of x1-2 for standing;. Pt with overall very poor functional endurance, unable to safely progress to ambulation and stiar negotiation at this time.  LTG requiring standing mobility downgraded due to poor activity tolerance. Mod I for sitting balance; supervision for w/c propulsion; min A for standing balance, bed mobility, transfers; mod A for amubulation in home environment   Activity tolerance, functional transfers, w/c propulsion, strength, balance, breathing, family education   Communication     Min  Min (may need advancement)  self-correction and  monitoring   Safety/Cognition/ Behavioral Observations    Min  Min (may need advancement)  basic problem soilving, emergent awareness    Pain     headache at times. PRN tylenol 650 q4hrs.   3 or less  assess pain and medicate as needed.     Skin     reddened scarum with EPBC applied, scattered bruising.   remain free of skin breakdown with min assist   assess skin q shift, provide pressure relief and boosting while in chair, turn q2-3 hrs while in bed    Rehab Goals Patient on target to meet rehab goals: Yes Rehab Goals Revised: ambulation goals were changed to moderate assistance;   other goals are minimal assistance from wheelchair *See Care Plan and progress notes for long and short-term goals.    Barriers to Discharge:  substantial weakness, ?premorbid deficits      Possible Resolutions to Barriers:   contineud therapy, strengthening     Discharge Planning/Teaching Needs:   Pt plans to go to his home with his wife and she will hire private duty caregivers as needed to help with pt's physical care.   Pt's wife is present daily and can be available for family education.  Wife is welcome to have private duty caregivers come for family education, as well.    Team Discussion:    Pt is chronically deconditioned, but his pulmonary status is improving.  Team to continue to monitor pt's fluid and food intake.  Pt's goals are minimal assistance overall from a wheelchair.  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   Revisions to Treatment Plan:    none    Continued Need for Acute Rehabilitation Level of Care: The patient requires daily medical management by a physician with specialized training in physical medicine and rehabilitation for the following conditions: Daily direction of a multidisciplinary physical rehabilitation program to ensure safe treatment while eliciting the highest outcome that is of practical value to the patient.: Yes Daily medical management of patient stability for increased activity during participation in an intensive rehabilitation regime.: Yes Daily analysis of laboratory values and/or radiology reports with any subsequent need for medication adjustment of medical intervention for : Post surgical problems;Neurological problems  Nida Manfredi, Vista Deck 08/05/2014, 9:55 AM

## 2014-08-05 NOTE — Progress Notes (Signed)
Occupational Therapy Weekly Progress Note  Patient Details  Name: Roy Mitchell MRN: [id number] Date of Birth: 1923-11-28  Beginning of progress report period: July 29, 2014 End of progress report period: August 05, 2014     Patient has met 3 of 4 short term goals.  Pt has made slow progress with BADLs since admission.  Pt continues to fatigue easily and requires multiple rest breaks.  Pt's c/o dizziness with transitional movements and requires extra time before continuing.  Pt states has had problems with this for years.  Pt currently using Stedy to pull up to facilitate bathing buttocks and pulling up pants.  Pt requires BUE support when standing and is currently unable to assist with tasks when standing.  He continues to need max facilitation for sit to stand and to maintain standing balance during LB selfcare.  He continues to also demonstrate  limited endurance in standing with only being able to tolerate 15 seconds to 1 minute.  Recommend continued CIR level therapy to progress pt back to a min assist level for ADLs.   Patient continues to demonstrate the following deficits: decreased balance, decreased strength, decreased endurance, and therefore will continue to benefit from skilled OT intervention to enhance overall performance with BADL.  Patient progressing toward long term goals..  Continue plan of care.  OT Short Term Goals Week 1:  OT Short Term Goal 1 (Week 1): Pt will peform bathing with Max A of 1 person in order to decrease assistance for self care task. OT Short Term Goal 1 - Progress (Week 1): Met OT Short Term Goal 2 (Week 1): Pt will perform LB dressing with Max A of 1 person for clothing management. OT Short Term Goal 2 - Progress (Week 1): Progressing toward goal OT Short Term Goal 3 (Week 1): Pt will perform UB dressing with Min A in order to increase independence in self care. OT Short Term Goal 3 - Progress (Week 1): Met OT Short Term Goal 4 (Week 1): Pt will  decrease to min verbal cues for initiation of self care tasks in order to decrease cues required for functional tasks.  OT Short Term Goal 4 - Progress (Week 1): Met Week 2:  OT Short Term Goal 1 (Week 2): Pt will perform LB dressing with max A OT Short Term Goal 2 (Week 2): Pt will perform toileting tasks with max A OT Short Term Goal 3 (Week 2): Pt will perform toilet transfers with max A  OT Short Term Goal 4 (Week 2): Pt will perform sit<>stand at sink with mod A in preparation for pulling up pants   Therapy Documentation Precautions:  Precautions Precautions: Fall Restrictions Weight Bearing Restrictions: No Pain: Pain Assessment Pain Assessment: 0-10 Pain Score: 5  Faces Pain Scale: Hurts even more Pain Type: Acute pain Pain Location: Head Pain Descriptors / Indicators: Headache Pain Onset: Gradual Pain Intervention(s): Medication (See eMAR)  See FIM for current functional status   Leroy Libman 08/05/2014, 8:43 AM   Clyda Greener, OTR/L 08/05/2014

## 2014-08-05 NOTE — Progress Notes (Signed)
[facility] PHYSICAL MEDICINE & REHABILITATION     PROGRESS NOTE    Subjective/Complaints: Feeling stronger. No complaints. Breathing better.  A  review of systems has been performed and if not noted above is otherwise negative. Patient Insight is reduced however   Objective: Vital Signs: Blood pressure 152/86, pulse 89, temperature 98.5 F (36.9 C), temperature source Oral, resp. rate 19, height  (1.676 m), weight 79.1 kg (174 lb 6.1 oz), SpO2 100 %. No results found. No results for input(s): WBC, HGB, HCT, PLT in the last 72 hours.  Recent Labs  08/04/14 0625  NA 139  K 3.8  CL 104  GLUCOSE 96  BUN 15  CREATININE 0.60  CALCIUM 8.2*   CBG (last 3)   Recent Labs  08/04/14 1612 08/04/14 2042 08/05/14 0648  GLUCAP 120* 89 96    Wt Readings from Last 3 Encounters:  08/05/14 79.1 kg (174 lb 6.1 oz)  07/08/14 84.3 kg (185 lb 13.6 oz)  04/30/14 89.585 kg (197 lb 8 oz)    Physical Exam:  Constitutional: He is oriented to person, place, and time. He appears well-developed and well-nourished.  HENT:  Head: Normocephalic and atraumatic.  Well healed left crani incision.  Eyes: Conjunctivae are normal. Pupils are equal, round, and reactive to light.  Neck: Normal range of motion. Neck supple.  Cardiovascular: Normal rate and regular rhythm.  No murmur heard. Respiratory: appears more comfortable. He has occasional wheezes and scattered rhonchi which are improved.  GI: Soft. Bowel sounds are normal. He exhibits no distension. There is no tenderness.  Musculoskeletal: He exhibits no edema or tenderness.  Neurological: He is alert and oriented to person, place, and time.  Continued dysarthria and dysphonia with expressive deficits. Decreased oro-motor control---but better.  apraxia noted with MMT. UE grossly 4/5 prox to distal. LE: 2+ hf, 3 ke and 4/5 ankle dorsi and plantarflexion. Sensory exam grossly intact Skin: Skin is warm and dry. Some scattered  ecchymoses.   Psych: pt pleasant and generally appropriate  Assessment/Plan: 1. Functional deficits secondary to left SDH complicated by deconditioning from multiple medical issues which require 3+ hours per day of interdisciplinary therapy in a comprehensive inpatient rehab setting. Physiatrist is providing close team supervision and 24 hour management of active medical problems listed below. Physiatrist and rehab team continue to assess barriers to discharge/monitor patient progress toward functional and medical goals. FIM: FIM - Bathing Bathing Steps Patient Completed: Chest, Right Arm, Left Arm, Abdomen, Right upper leg, Left upper leg Bathing: 3: Mod-Patient completes 5-7 91f 10 parts or 50-74%  FIM - Upper Body Dressing/Undressing Upper body dressing/undressing steps patient completed: Thread/unthread right sleeve of pullover shirt/dresss, Thread/unthread left sleeve of pullover shirt/dress, Put head through opening of pull over shirt/dress Upper body dressing/undressing: 4: Min-Patient completed 75 plus % of tasks FIM - Lower Body Dressing/Undressing Lower body dressing/undressing: 1: Two helpers  FIM - Toileting Toileting steps completed by patient: Performs perineal hygiene Toileting: 1: Total-Patient completed zero steps, helper did all 3  FIM - Archivist Transfers: 1-Two helpers (per [facility], [place])  FIM - Bed/Chair Transfer Bed/Chair Transfer Assistive Devices: Sliding board Bed/Chair Transfer: 3: Supine > Sit: Mod A (lifting assist/Pt. 50-74%/lift 2 legs, 3: Sit > Supine: Mod A (lifting assist/Pt. 50-74%/lift 2 legs), 2: Bed > Chair or W/C: Max A (lift and lower assist)  FIM - Locomotion: Wheelchair Distance: 50 Locomotion: Wheelchair: 2: Travels 50 - 149 ft with supervision, cueing or coaxing FIM - Locomotion:  Ambulation Locomotion: Ambulation Assistive Devices: Parallel bars Ambulation/Gait Assistance: Not tested (comment) Locomotion: Ambulation: 0:  Activity did not occur  Comprehension Comprehension Mode: Auditory Comprehension: 5-Follows basic conversation/direction: With no assist  Expression Expression Mode: Verbal Expression: 5-Expresses basic needs/ideas: With no assist  Social Interaction Social Interaction: 5-Interacts appropriately 90% of the time - Needs monitoring or encouragement for participation or interaction.  Problem Solving Problem Solving Mode: Not assessed Problem Solving: 4-Solves basic 75 - 89% of the time/requires cueing 10 - 24% of the time  Memory Memory: 3-Recognizes or recalls 50 - 74% of the time/requires cueing 25 - 49% of the time Medical Problem List and Plan: 1. Functional deficits secondary to Left SDH complicated by acute respiratory failure 2. DVT Prophylaxis/Anticoagulation: Mechanical: Sequential compression devices, below knee Bilateral lower extremities 3. Pain Management: tylenol prn 4. Mood: Continue remeron at bedtime. LCSW to follow for evaluation and support.  5. Neuropsych: This patient is not capable of making decisions on his own behalf. 6. Skin/Wound Care: Routine pressure relief measures.  7. Fluids/Electrolytes/Nutrition: Monitor I/O.    - Continue pro stat supplement tid. Offer ensure pudding or magic cup between meals.  -encourage PO   -BMET within normal limits today  -low dose megace 8. Hematuria: Has h/o BPH--sees Dr. Saddie Bendershao in [place]. Continue flomax and finasteride. Monitor voiding with PVR check. Monitor H/H.   -HGB trending up 9. Reactive airway disease: Was on nocturnal oxygen with nebs bid PTA.   -follow up cxr stable to improved  -added mucomyst to help with secretions which seems to have helped  -decreased prednisone to  5 mg daily   -IS, coughing, OOB 10. HTN: Will monitor BP every 8 hours. Continue metoprolol bid.  11. Seizure prophylaxis: Will continue Keppra bid 12. Acute respiratory failure: Continue budesonide nebs bid. Encourage IS every 4  hours while awake.   -oob, encouraged him to cough up secretions, mucomyst    LOS (Days) 8 A FACE TO FACE EVALUATION WAS PERFORMED  SWARTZ,ZACHARY T 08/05/2014 8:12 AM

## 2014-08-06 ENCOUNTER — Encounter (HOSPITAL_COMMUNITY): Payer: Medicare Other

## 2014-08-06 ENCOUNTER — Ambulatory Visit (HOSPITAL_COMMUNITY): Payer: Medicare Other | Admitting: Speech Pathology

## 2014-08-06 ENCOUNTER — Inpatient Hospital Stay (HOSPITAL_COMMUNITY): Payer: Medicare Other

## 2014-08-06 LAB — GLUCOSE, CAPILLARY
Glucose-Capillary: 106 mg/dL — ABNORMAL HIGH (ref 70–99)
Glucose-Capillary: 137 mg/dL — ABNORMAL HIGH (ref 70–99)
Glucose-Capillary: 103 mg/dL — ABNORMAL HIGH (ref 70–99)
Glucose-Capillary: 151 mg/dL — ABNORMAL HIGH (ref 70–99)

## 2014-08-06 MED ORDER — BIOTENE DRY MOUTH MT LIQD
15.0000 mL | Freq: Every day | OROMUCOSAL | Status: DC
  Administered 2014-08-06: 15 mL via OROMUCOSAL

## 2014-08-06 NOTE — Progress Notes (Addendum)
[facility] PHYSICAL MEDICINE & REHABILITATION     PROGRESS NOTE    Subjective/Complaints: No complaints. Slept fairly well. Therapy wears him out. Breathing better A  review of systems has been performed and if not noted above is otherwise negative. Patient Insight is reduced however   Objective: Vital Signs: Blood pressure 118/74, pulse 88, temperature 98.3 F (36.8 C), temperature source Oral, resp. rate 18, height 5\' 6"  (1.676 m), weight 79.1 kg (174 lb 6.1 oz), SpO2 90 %. No results found. No results for input(s): WBC, HGB, HCT, PLT in the last 72 hours.  Recent Labs  08/04/14 0625  NA 139  K 3.8  CL 104  GLUCOSE 96  BUN 15  CREATININE 0.60  CALCIUM 8.2*   CBG (last 3)   Recent Labs  08/05/14 1632 08/05/14 2117 08/06/14 0658  GLUCAP 122* 127* 106*    Wt Readings from Last 3 Encounters:  08/05/14 79.1 kg (174 lb 6.1 oz)  07/08/14 84.3 kg (185 lb 13.6 oz)  04/30/14 89.585 kg (197 lb 8 oz)    Physical Exam:  Constitutional: He is oriented to person, place, and time. He appears well-developed and well-nourished.  HENT: mucosa still pretty dry Head: Normocephalic and atraumatic.  Well healed left crani incision.  Eyes: Conjunctivae are normal. Pupils are equal, round, and reactive to light.  Neck: Normal range of motion. Neck supple.  Cardiovascular: Normal rate and regular rhythm.  No murmur heard. Respiratory: appears more comfortable. He has occasional wheezes and scattered rhonchi which are improved.  GI: Soft. Bowel sounds are normal. He exhibits no distension. There is no tenderness.  Musculoskeletal: He exhibits no edema or tenderness.  Neurological: He is alert and oriented to person, place, and time.  Continued dysarthria and dysphonia with expressive deficits. Decreased oro-motor control---but better.  apraxia noted with MMT. UE grossly 4/5 prox to distal. LE: 2+ hf, 3 ke and 4/5 ankle dorsi and plantarflexion. Sensory exam grossly  intact Skin: Skin is warm and dry. Some scattered ecchymoses.   Psych: pt pleasant and generally appropriate  Assessment/Plan: 1. Functional deficits secondary to left SDH complicated by deconditioning from multiple medical issues which require 3+ hours per day of interdisciplinary therapy in a comprehensive inpatient rehab setting. Physiatrist is providing close team supervision and 24 hour management of active medical problems listed below. Physiatrist and rehab team continue to assess barriers to discharge/monitor patient progress toward functional and medical goals. FIM: FIM - Bathing Bathing Steps Patient Completed: Chest, Right Arm, Left Arm, Abdomen, Right upper leg, Left upper leg Bathing: 3: Mod-Patient completes 5-7 6863f 10 parts or 50-74%  FIM - Upper Body Dressing/Undressing Upper body dressing/undressing steps patient completed: Thread/unthread right sleeve of pullover shirt/dresss, Thread/unthread left sleeve of pullover shirt/dress, Put head through opening of pull over shirt/dress Upper body dressing/undressing: 4: Min-Patient completed 75 plus % of tasks FIM - Lower Body Dressing/Undressing Lower body dressing/undressing: 1: Two helpers  FIM - Toileting Toileting steps completed by patient: Performs perineal hygiene Toileting: 1: Two helpers  FIM - Diplomatic Services operational officerToilet Transfers Toilet Transfers Assistive Devices: Therapist, musicGrab bars Toilet Transfers: 3-From toilet/BSC: Mod A (lift or lower assist), 3-To toilet/BSC: Mod A (lift or lower assist)  FIM - BankerBed/Chair Transfer Bed/Chair Transfer Assistive Devices: Sliding board Bed/Chair Transfer: 0: Activity did not occur  FIM - Locomotion: Wheelchair Distance: 100 Locomotion: Wheelchair: 2: Travels 50 - 149 ft with supervision, cueing or coaxing FIM - Locomotion: Ambulation Locomotion: Ambulation Assistive Devices: Parallel bars Ambulation/Gait Assistance: Not tested (comment)  Locomotion: Ambulation: 0: Activity did not  occur  Comprehension Comprehension Mode: Auditory Comprehension: 5-Follows basic conversation/direction: With no assist  Expression Expression Mode: Verbal Expression: 5-Expresses basic needs/ideas: With no assist  Social Interaction Social Interaction: 4-Interacts appropriately 75 - 89% of the time - Needs redirection for appropriate language or to initiate interaction.  Problem Solving Problem Solving Mode: Not assessed Problem Solving: 5-Solves basic problems: With no assist  Memory Memory: 5-Recognizes or recalls 90% of the time/requires cueing < 10% of the time Medical Problem List and Plan: 1. Functional deficits secondary to Left SDH complicated by acute respiratory failure 2. DVT Prophylaxis/Anticoagulation: Mechanical: Sequential compression devices, below knee Bilateral lower extremities 3. Pain Management: tylenol prn 4. Mood: Continue remeron at bedtime. LCSW to follow for evaluation and support.  5. Neuropsych: This patient is not capable of making decisions on his own behalf. 6. Skin/Wound Care: Routine pressure relief measures.  7. Fluids/Electrolytes/Nutrition: Monitor I/O.    - Continue pro stat supplement tid. Offer ensure pudding or magic cup between meals.  -encourage PO   -BMET within normal limits today  -low dose megace 8. Hematuria: Has h/o BPH--sees Dr. Saddie Benders in [place]. Continue flomax and finasteride. Monitor voiding with PVR check. Monitor H/H.   -HGB trending up 9. Reactive airway disease: Was on nocturnal oxygen with nebs bid PTA.   -follow up cxr stable to improved  -decreased prednisone to  5 mg daily   -IS, coughing, OOB 10. HTN:   Continue metoprolol bid.  11. Seizure prophylaxis: Will continue Keppra bid 12. Acute respiratory failure: Continue budesonide nebs bid. Encourage IS every 4 hours while awake.   -oob, encouraged him to cough up secretions,  Mucomyst stopped    LOS (Days) 9 A FACE TO FACE EVALUATION WAS  PERFORMED  Roy Mitchell T 08/06/2014 8:57 AM

## 2014-08-06 NOTE — Progress Notes (Signed)
Physical Therapy Weekly Progress Note  Patient Details  Name: Roy Mitchell MRN: [id number] Date of Birth: [birth year]  Beginning of progress report period: July 29, 2014 End of progress report period: August 06, 2014  Today's Date: 08/06/2014 PT Individual Time: 0800-0900 PT Individual Time Calculation (min): 60 min   Over the last 7 days we have seen slow but steady progress and has met 3/5 STG's. Pt currently requires modAx1 for bed mobility, modAx1 squat pivot transfer EOB<>w/c with mod verbal cues progressing to min verbal cues with practice; when less fatigued pt able to perform stand pivot transfer. Requires close(S) for w/c propulsion with B UE for 100-125', requiring a significant amount of extra time. Pt demonstrates R lean sitting EOB requiring close(S) and min verbal cues to come to midline, pt able to correct but not sustain. At this time, ambulation and and stairs are non functional due to inability for pt to stand for >10sec. Pt continues to require short but frequent rest break due to poor functional endurance level. Ongoing education with family and pt regarding pt progress to date as well as anticipated functional w/c level at time of discharge, however LTGs continue to include ambulation and stair negotiation for B LE to address strength and progression towards more functional standing mobility.  Patient continues to demonstrate the following deficits: decreased attention, decreased awareness, decreased functional endurance, decreased strength, decreased postural control and decreased sitting/standing balance, decreased standing tolerance, impaired sequencing, decreased overall functional transfers and mobility and therefore will continue to benefit from skilled PT intervention to enhance overall performance with activity tolerance, balance, postural control, ability to compensate for deficits, attention and awareness.  Patient not progressing toward long term goals.  See goal  revision.  Plan of care revisions: downgraded ambulatory goals due to patients continued functional deficits including decreased strength and endurance.  PT Short Term Goals Week 1:   PT Short Term Goal 1 (Week 1): pt will perform supine> sit with mod assist  PT Short Term Goal 1 - Progress (Week 1): Met PT Short Term Goal 2 (Week 1): pt will perform basic transfer with assistance from 1 person PT Short Term Goal 2 - Progress (Week 1): Met PT Short Term Goal 3 (Week 1): pt will propel w/c x 76' with supervision PT Short Term Goal 3 - Progress (Week 1): Met PT Short Term Goal 4 (Week 1): pt will perform gait x 20' with assistance of 2 people PT Short Term Goal 4 - Progress (Week 1): Revised due to lack of progress PT Short Term Goal 5 (Week 1): pt will negotiate 1 step with assistance of 1 person PT Short Term Goal 5 - Progress (Week 1): Revised due to lack of progress  Week 2: PT Short Term Goal 1 (Week 2): pt will maintain dynamic sitting balance with (S) PT Short Term Goal 2 (Week 2): pt will perform bed mobility in hospital bed with minA 50% of the time and minimal cuing for sequencing PT Short Term Goal 3 (Week 2): pt will perform bed<>chair transfers with minA 50% of the time and min cuing for sequencing PT Short Term Goal 4 (Week 2): pt will perform sit<>stand from non-elevated surface with assistance of modAx1 PT Short Term Goal 5 (Week 2): pt will maintain standing for >30sec with assist of 1 person  Skilled Therapeutic Interventions/Progress Updates:  1:1. Pt received supine in bed immediatly after breathing treatment. Session focused on functional transfers, wheelchair propulsion, functional mobility and pt education. Throughout  session pt was educated on functional transfers and techniques. Pt with limited carry over for education from previous sessions requiring overall mod cues for technique and sequencing. Pt able to complete supine<>sit with use of hospital bed functions with  minA, supine<>sit on treatment mat to simulate standard bed with modA, emphasis on log roll technique. Squat pivot transfer hospital bed>w/c<>treatment mat were all modA.   Pt propelled w/c 125'x1 feet using B UE with supervision and min cues for increased speed and technique to return to room. Pt left sitting in w/c with wife, quick release belt in place, and all needs within reach.  Therapy Documentation Precautions:  Precautions Precautions: Fall Restrictions Weight Bearing Restrictions: No  Vital Signs: Oxygen Therapy SpO2: 90 % O2 Device: Nasal Cannula O2 Flow Rate (L/min): 1 L/min (trial during PT) Pulse Oximetry Type: Intermittent  See FIM for current functional status  Therapy/Group: Individual Therapy  Carlos Levering 08/06/2014, 5:01 PM

## 2014-08-06 NOTE — Progress Notes (Signed)
Physical Therapy Session Note  Patient Details  Name: Roy Mitchell MRN: [id number] Date of Birth: 05/09/1924  Today's Date: 08/06/2014 PT Individual Time: 1125-1200 PT Individual Time Calculation (min): 35 min MAKEUP SESSION  Skilled Therapeutic Interventions/Progress Updates:   Pt received sitting in wheelchair with wife in room, agreeable to makeup session. Pt maintained on 1 L 02 via [place] throughout session with oxygen saturation monitored throughout, dropping to 90-91% with mobility and increasing up to 95-96% after seated rest. Pt propelled wheelchair using BUE x 75 ft with supervision and extra time and verbal cues for efficiency with propulsion. Pt performed squat pivot transfers wheelchair > mat to R with min A and at end of session mat > wheelchair to L with mod A. Session focused on standing tolerance from slightly raised mat table with RW for BUE support. Pt performed sit <> stand from mat with max cues for hand placement and overall min A. Pt performed 4 standing trials with supervision-min A: 30 sec, 50 sec, 30 sec, and 15 sec, respectively. Pt with increased SOB requiring prolonged seated rest with all mobility. Pt returned to room and left sitting in wheelchair with wife and daughter present.   Therapy Documentation Precautions:  Precautions Precautions: Fall Restrictions Weight Bearing Restrictions: No Vital Signs: Oxygen Therapy SpO2: 90 % O2 Device: Nasal Cannula O2 Flow Rate (L/min): 1 L/min (trial during PT) Pulse Oximetry Type: Intermittent Pain: Pain Assessment Pain Assessment: No/denies pain  See FIM for current functional status  Therapy/Group: Individual Therapy  Kerney ElbeVarner, Dionysios Massman A 08/06/2014, 12:06 PM

## 2014-08-06 NOTE — Progress Notes (Signed)
Social Work Patient ID: Roy Mitchell, male   DOB: [birth year], 80 y.o.   MRN: [id number]   CSW met with pt and his wife and dtr to update them on team conference discussion.  Pt's targeted d/c date is 08-25-14 and they were pleased about this.  Pt and wife's anniversary is 08/29/2014 and so February is a special month for them.  Dtr inquired about the couple having a meal together that night.  CSW will talk with staff and see if they can use the tables in the dayroom that night to have a special dinner out of pt's room.  They were excited about that possibility.  CSW explained that pt's goals were set for minimal assistance at the w/c level so that family could be preparing for this.  Pt's wife has already been in touch with private duty agency, but will now share the d/c date with them.  [facility] f/u care at home.  CSW will assist with that and will continue to follow and assist as needed.

## 2014-08-06 NOTE — Progress Notes (Signed)
Occupational Therapy Session Note  Patient Details  Name: Roy Mitchell MRN: [id number] Date of Birth: 15-Mar-1924  Today's Date: 08/06/2014 OT Individual Time: 1000-1100 OT Individual Time Calculation (min): 60 min    Short Term Goals: Week 2:  OT Short Term Goal 1 (Week 2): Pt will perform LB dressing with max A OT Short Term Goal 2 (Week 2): Pt will perform toileting tasks with max A OT Short Term Goal 3 (Week 2): Pt will perform toilet transfers with max A  OT Short Term Goal 4 (Week 2): Pt will perform sit<>stand at sink with mod A in preparation for pulling up pants  Skilled Therapeutic Interventions/Progress Updates:    Pt seated in w/c with wife present upon arrival. Pt agreeable and ready for bathing and dressing tasks with sit<>stand from w/c at sink.  Pt O2 sats>90% on 1L O2 throughout session.  Pt requires extra time with multiple rest breaks to complete tasks. Pt stood at sink X 4 during tasks.  Pt required min A for sit<>stand and min A for standing.  Pt stood first time for approx 2 mins secondary to bowel incontinence and patient needed to be cleaned up.  Subsequent standing for considerably less time (30 secs, 20 secs, and 15 secs).  Pt required longer rest breaks as session progressed.  Pt required tot A for LB dressing/bathing secondary to time constraints.  Focus on activity tolerance, sit<>stand, standing balance, BADL retraining, and safety awareness.  Therapy Documentation Precautions:  Precautions Precautions: Fall Restrictions Weight Bearing Restrictions: No Pain: Pain Assessment Pain Assessment: No/denies pain  See FIM for current functional status  Therapy/Group: Individual Therapy  [name], Nyshawn Gowdy Chappell 08/06/2014, 11:03 AM

## 2014-08-06 NOTE — Progress Notes (Signed)
Speech Language Pathology Daily Session Note  Patient Details  Name: Roy Mitchell MRN: [id number] Date of Birth: 1923/12/01  Today's Date: 08/06/2014 SLP Group Time: 1300-1400 SLP Group Time Calculation (min): 60 min  Short Term Goals: Week 2: SLP Short Term Goal 1 (Week 2): Pt will tolerate presentations of his currently prescribed diet with no overt s/s of aspiration with supervision cues for use of swallowing precautions.   SLP Short Term Goal 2 (Week 2): Pt will tolerate trials of regular water with no overt s/s of aspiration with supervision cues for use of swallowing precautions.  SLP Short Term Goal 3 (Week 2): Pt will improve speech intelligibility at the phrase level to >80 % accuracy with min cues for use of dysarthria strategies.   SLP Short Term Goal 4 (Week 2): Pt will improve functional word finding at the phrase level over 75% of observable opportunities with min cues for use of compensatory strategies.  SLP Short Term Goal 5 (Week 2): Pt will improve functional problem solving during basic, structured, therapeutic tasks over 75% of observable opportunities with mod cues.    Skilled Therapeutic Interventions:  Pt was seen for skilled group ST targeting language goals.  SLP facilitated the session with a structured barrier activity targeting use of description as a word finding strategy to improve functional communication at the phrase/sentence level.  Pt completed the abovementioned activity with overall min assist semantic cues for ~80% accuracy.  Pt also benefited from min cues to increase vocal intensity to improve speech intelligibility to ~75% in a quiet environment.  Continue per current plan of care.    FIM:  Comprehension Comprehension Mode: Auditory Comprehension: 5-Follows basic conversation/direction: With extra time/assistive device Expression Expression Mode: Verbal Expression: 4-Expresses basic 75 - 89% of the time/requires cueing 10 - 24% of the time. Needs  helper to occlude trach/needs to repeat words. Social Interaction Social Interaction: 5-Interacts appropriately 90% of the time - Needs monitoring or encouragement for participation or interaction. Problem Solving Problem Solving: 4-Solves basic 75 - 89% of the time/requires cueing 10 - 24% of the time Memory Memory: 4-Recognizes or recalls 75 - 89% of the time/requires cueing 10 - 24% of the time FIM - Eating Eating Activity: 5: Supervision/cues  Pain Pain Assessment Pain Assessment: No/denies pain  Therapy/Group: Group Therapy  Roy Mitchell, Melanee SpryNicole Mitchell 08/06/2014, 4:18 PM

## 2014-08-07 ENCOUNTER — Encounter (HOSPITAL_COMMUNITY): Payer: Medicare Other

## 2014-08-07 ENCOUNTER — Inpatient Hospital Stay (HOSPITAL_COMMUNITY): Payer: Medicare Other

## 2014-08-07 ENCOUNTER — Inpatient Hospital Stay (HOSPITAL_COMMUNITY): Payer: Medicare Other | Admitting: Speech Pathology

## 2014-08-07 DIAGNOSIS — J159 Unspecified bacterial pneumonia: Secondary | ICD-10-CM

## 2014-08-07 LAB — CBC WITH DIFFERENTIAL/PLATELET
Basophils Absolute: 0 10*3/uL (ref 0.0–0.1)
Basophils Relative: 0 % (ref 0–1)
EOS ABS: 0.1 10*3/uL (ref 0.0–0.7)
Eosinophils Relative: 1 % (ref 0–5)
HEMATOCRIT: 36.5 % — AB (ref 39.0–52.0)
Hemoglobin: 11.6 g/dL — ABNORMAL LOW (ref 13.0–17.0)
Lymphocytes Relative: 22 % (ref 12–46)
Lymphs Abs: 1.8 10*3/uL (ref 0.7–4.0)
MCH: 26.7 pg (ref 26.0–34.0)
MCHC: 31.8 g/dL (ref 30.0–36.0)
MCV: 84.1 fL (ref 78.0–100.0)
Monocytes Absolute: 0.8 10*3/uL (ref 0.1–1.0)
Monocytes Relative: 10 % (ref 3–12)
NEUTROS ABS: 5.7 10*3/uL (ref 1.7–7.7)
Neutrophils Relative %: 67 % (ref 43–77)
Platelets: 186 10*3/uL (ref 150–400)
RBC: 4.34 MIL/uL (ref 4.22–5.81)
RDW: 16.5 % — ABNORMAL HIGH (ref 11.5–15.5)
WBC: 8.4 10*3/uL (ref 4.0–10.5)

## 2014-08-07 LAB — GLUCOSE, CAPILLARY
GLUCOSE-CAPILLARY: 119 mg/dL — AB (ref 70–99)
GLUCOSE-CAPILLARY: 166 mg/dL — AB (ref 70–99)
Glucose-Capillary: 113 mg/dL — ABNORMAL HIGH (ref 70–99)
Glucose-Capillary: 151 mg/dL — ABNORMAL HIGH (ref 70–99)

## 2014-08-07 MED ORDER — STARCH (THICKENING) PO POWD
ORAL | Status: DC | PRN
  Filled 2014-08-07 (×2): qty 227

## 2014-08-07 MED ORDER — LEVALBUTEROL HCL 0.63 MG/3ML IN NEBU
0.6300 mg | INHALATION_SOLUTION | Freq: Three times a day (TID) | RESPIRATORY_TRACT | Status: DC
  Administered 2014-08-07 – 2014-08-12 (×18): 0.63 mg via RESPIRATORY_TRACT
  Filled 2014-08-07 (×36): qty 3

## 2014-08-07 MED ORDER — LEVOFLOXACIN 500 MG PO TABS
500.0000 mg | ORAL_TABLET | Freq: Every day | ORAL | Status: DC
  Administered 2014-08-07 – 2014-08-11 (×5): 500 mg via ORAL
  Filled 2014-08-07 (×6): qty 1

## 2014-08-07 MED ORDER — BIOTENE DRY MOUTH MT LIQD
15.0000 mL | Freq: Three times a day (TID) | OROMUCOSAL | Status: DC
  Administered 2014-08-07 – 2014-08-13 (×17): 15 mL via OROMUCOSAL

## 2014-08-07 NOTE — Progress Notes (Signed)
[place] PHYSICAL MEDICINE & REHABILITATION     PROGRESS NOTE    Subjective/Complaints: No real complaints although appears a little more sob with therapy  A  review of systems has been performed and if not noted above is otherwise negative. Patient Insight is reduced however   Objective: Vital Signs: Blood pressure 117/55, pulse 96, temperature 99.8 F (37.7 C), temperature source Oral, resp. rate 18, height  (1.676 m), weight 79.1 kg (174 lb 6.1 oz), SpO2 92 %. No results found. No results for input(s): WBC, HGB, HCT, PLT in the last 72 hours. No results for input(s): NA, K, CL, GLUCOSE, BUN, CREATININE, CALCIUM in the last 72 hours.  Invalid input(s): CO CBG (last 3)   Recent Labs  08/06/14 1626 08/06/14 2025 08/07/14 0704  GLUCAP 103* 151* 113*    Wt Readings from Last 3 Encounters:  08/05/14 79.1 kg (174 lb 6.1 oz)  07/08/14 84.3 kg (185 lb 13.6 oz)  04/30/14 89.585 kg (197 lb 8 oz)    Physical Exam:  Constitutional: He is oriented to person, place, and time. He appears well-developed and well-nourished.  HENT: mucosa still pretty dry Head: Normocephalic and atraumatic.  Well healed left crani incision.  Eyes: Conjunctivae are normal. Pupils are equal, round, and reactive to light.  Neck: Normal range of motion. Neck supple.  Cardiovascular: Normal rate and regular rhythm.  No murmur heard. Respiratory: appears a little more dsypneic. He has some crackles at the right base greater than left.  GI: Soft. Bowel sounds are normal. He exhibits no distension. There is no tenderness.  Musculoskeletal: He exhibits no edema or tenderness.  Neurological: He is alert and oriented to person, place, and time.  Continued dysarthria and dysphonia with expressive deficits. Decreased oro-motor control---but better.  apraxia noted with MMT. UE grossly 4/5 prox to distal. LE: 2+ hf, 3 ke and 4/5 ankle dorsi and plantarflexion. Sensory exam grossly intact Skin:  Skin is warm and dry. Some scattered ecchymoses.   Psych: pt pleasant and generally appropriate  Assessment/Plan: 1. Functional deficits secondary to left SDH complicated by deconditioning from multiple medical issues which require 3+ hours per day of interdisciplinary therapy in a comprehensive inpatient rehab setting. Physiatrist is providing close team supervision and 24 hour management of active medical problems listed below. Physiatrist and rehab team continue to assess barriers to discharge/monitor patient progress toward functional and medical goals. FIM: FIM - Bathing Bathing Steps Patient Completed: Chest, Right Arm, Left Arm, Abdomen Bathing: 2: Max-Patient completes 3-4 19f 10 parts or 25-49%  FIM - Upper Body Dressing/Undressing Upper body dressing/undressing steps patient completed: Thread/unthread right sleeve of pullover shirt/dresss, Thread/unthread left sleeve of pullover shirt/dress Upper body dressing/undressing: 3: Mod-Patient completed 50-74% of tasks FIM - Lower Body Dressing/Undressing Lower body dressing/undressing: 1: Two helpers  FIM - Toileting Toileting steps completed by patient: Performs perineal hygiene Toileting: 1: Two helpers  FIM - Diplomatic Services operational officer Devices: Therapist, music Transfers: 3-From toilet/BSC: Mod A (lift or lower assist), 3-To toilet/BSC: Mod A (lift or lower assist)  FIM - Banker Devices: Arm rests, HOB elevated, Bed rails Bed/Chair Transfer: 3: Supine > Sit: Mod A (lifting assist/Pt. 50-74%/lift 2 legs, 2: Bed > Chair or W/C: Max A (lift and lower assist)  FIM - Locomotion: Wheelchair Distance: 100 Locomotion: Wheelchair: 2: Travels 50 - 149 ft with supervision, cueing or coaxing FIM - Locomotion: Ambulation Locomotion: Ambulation Assistive Devices: Parallel bars Ambulation/Gait Assistance: Not tested (  comment) Locomotion: Ambulation: 0: Activity did not  occur  Comprehension Comprehension Mode: Auditory Comprehension: 5-Follows basic conversation/direction: With extra time/assistive device  Expression Expression Mode: Verbal Expression: 4-Expresses basic 75 - 89% of the time/requires cueing 10 - 24% of the time. Needs helper to occlude trach/needs to repeat words.  Social Interaction Social Interaction: 5-Interacts appropriately 90% of the time - Needs monitoring or encouragement for participation or interaction.  Problem Solving Problem Solving Mode: Not assessed Problem Solving: 4-Solves basic 75 - 89% of the time/requires cueing 10 - 24% of the time  Memory Memory: 4-Recognizes or recalls 75 - 89% of the time/requires cueing 10 - 24% of the time Medical Problem List and Plan: 1. Functional deficits secondary to Left SDH complicated by acute respiratory failure 2. DVT Prophylaxis/Anticoagulation: Mechanical: Sequential compression devices, below knee Bilateral lower extremities 3. Pain Management: tylenol prn 4. Mood: Continue remeron at bedtime. LCSW to follow for evaluation and support.  5. Neuropsych: This patient is not capable of making decisions on his own behalf. 6. Skin/Wound Care: Routine pressure relief measures.  7. Fluids/Electrolytes/Nutrition: Monitor I/O.    - Continue pro stat supplement tid. Offer ensure pudding or magic cup between meals.  -encourage PO   -BMET within normal limits today  -added low dose megace 8. Hematuria: Has h/o BPH--sees Dr. Saddie Bendershao in [place]. Continue flomax and finasteride. Monitor voiding with PVR check. Monitor H/H.   -HGB trending up 9. Reactive airway disease: Was on nocturnal oxygen with nebs bid PTA.   -follow up cxr stable to improved  -decreased prednisone to  5 mg daily   -IS, coughing, OOB, biotene for oral mucosa 10. HTN:   Continue metoprolol bid.  11. Seizure prophylaxis: Will continue Keppra bid 12. Acute respiratory failure, low grade temp: Continue budesonide  nebs bid. Encourage IS every 4 hours while awake.   -oob, encouraged him to cough up secretions,  Mucomyst stopped  -add levaquin  -check CBC/diff    LOS (Days) 10 A FACE TO FACE EVALUATION WAS PERFORMED  Charlot Gouin T 08/07/2014 9:20 AM

## 2014-08-07 NOTE — Progress Notes (Signed)
SLP Cancellation Note  Patient Details Name: Roy Mitchell MRN: [id number] DOB: 1923/07/28   Cancelled treatment:       Patient missed 45 minutes of skilled SLP intervention due to fatigue and reports of not feeling well. RN made aware.                                                                                                Deyton Ellenbecker 08/07/2014, 2:33 PM

## 2014-08-07 NOTE — Progress Notes (Signed)
Physical Therapy Session Note  Patient Details  Name: Roy Mitchell P Qadri MRN: [id number] Date of Birth: 02-11-1924  Today's Date: 08/07/2014 PT Missed Time: 45 Minutes Missed Time Reason: Patient ill (Comment);Patient fatigue (pt very SOB, increased chest congestion noted from AM session)  Short Term Goals: Week 2:  PT Short Term Goal 1 (Week 2): pt will maintain dynamic sitting balance with (S) PT Short Term Goal 2 (Week 2): pt will perform bed mobility in hospital bed with minA 50% of the time and minimal cuing for sequencing PT Short Term Goal 3 (Week 2): pt will perform bed<>chair transfers with minA 50% of the time and min cuing for sequencing PT Short Term Goal 4 (Week 2): pt will perform sit<>stand from non-elevated surface with assistance of modAx1 PT Short Term Goal 5 (Week 2): pt will maintain standing for >30sec with assist of 1 person  Skilled Therapeutic Interventions/Progress Updates:  Pt received in bed, HOB elevated. Missed 45 minutes of scheduled skilled physical therapy due to fatigue and not feeling well. Pt noted to be very SOB, difficulty verbalizing more than 2 words at a time and significantly increased chest congestion from AM. Attempted to position pt for improved clearance of secretions. Pt left semi-reclined in bed, bed alarm on, all needs in reach, RN aware.  Therapy Documentation Precautions:  Precautions Precautions: Fall Restrictions Weight Bearing Restrictions: No General: PT Amount of Missed Time (min): 45 Minutes PT Missed Treatment Reason: Patient ill (Comment);Patient fatigue (pt very SOB, increased chest congestion noted from AM session) Vital Signs: Therapy Vitals Temp: 98.4 F (36.9 C) Temp Source: Oral Pulse Rate: (!) 113 Resp: 20 BP: 136/66 mmHg Patient Position (if appropriate): Lying Oxygen Therapy SpO2: 94 % O2 Device: Nasal Cannula O2 Flow Rate (L/min): 2 L/min  See FIM for current functional status  Therapy/Group: Individual  Therapy  Everlene Otherck, Trenita Hulme 08/07/2014, 1:59 PM

## 2014-08-07 NOTE — Progress Notes (Signed)
Physical Therapy Session Note  Patient Details  Name: Roy Mitchell MRN: [id number] Date of Birth: 06-16-1924  Today's Date: 08/07/2014 PT Individual Time: 0930-1000 PT Individual Time Calculation (min): 30 min   Short Term Goals: Week 2:  PT Short Term Goal 1 (Week 2): pt will maintain dynamic sitting balance with (S) PT Short Term Goal 2 (Week 2): pt will perform bed mobility in hospital bed with minA 50% of the time and minimal cuing for sequencing PT Short Term Goal 3 (Week 2): pt will perform bed<>chair transfers with minA 50% of the time and min cuing for sequencing PT Short Term Goal 4 (Week 2): pt will perform sit<>stand from non-elevated surface with assistance of modAx1 PT Short Term Goal 5 (Week 2): pt will maintain standing for >30sec with assist of 1 person  Skilled Therapeutic Interventions/Progress Updates:  1:1. Pt received sitting in w/c in room, ready and willing to participate in physical therapy. Today's session focused on therapeutic activity including functional transfers, activity tolerance, and sitting balance. Pt transported by staff in w/c room<>gym throughout session for time management. Pt squat pivot t/f w/c<>bed with maxAx1 person to facilitate anterior weight shift and lifting assistance. Pt attempted 3 stands from elevated mat height, 1 w/o RW, 2 w/ RW. Pt unable to rise to stand despite maxAx1 person. Notable increased R anterior lean in sitting with B UE support on mat. Pt preformed seated B marching 2 sets x10, pt unable to maintain upright posture during exercise.  Pt reports receiving a breathing treatment prior to session and student PT noted increased coughing, chest congestion, SOB, and pt saO2 hovering >90% (2L supplemental). Pt appeared to fatigue more quickly than in previous sessions. Pt left seated in w/c, all needs within reach, RN and NT in room.  Therapy Documentation Precautions:  Precautions Precautions: Fall Restrictions Weight Bearing  Restrictions: No Vital Signs: Oxygen Therapy SpO2: 94 % O2 Device: Nasal Cannula O2 Flow Rate (L/min): 2 L/min Locomotion :  See FIM for current functional status  Therapy/Group: Individual Therapy  Everlene Otherck, Marigene Erler 08/07/2014, 12:31 PM

## 2014-08-07 NOTE — Progress Notes (Signed)
Occupational Therapy Session Note  Patient Details  Name: Roy Mitchell MRN: [id number] Date of Birth: Nov 28, 1923  Today's Date: 08/07/2014 OT Individual Time: 0700-0800 OT Individual Time Calculation (min): 60 min    Short Term Goals: Week 2:  OT Short Term Goal 1 (Week 2): Pt will perform LB dressing with max A OT Short Term Goal 2 (Week 2): Pt will perform toileting tasks with max A OT Short Term Goal 3 (Week 2): Pt will perform toilet transfers with max A  OT Short Term Goal 4 (Week 2): Pt will perform sit<>stand at sink with mod A in preparation for pulling up pants  Skilled Therapeutic Interventions/Progress Updates:    Pt resting in bed upon arrival.  Pt agreeable to participate in therapy.  Pt noted with increased difficulty with breathing and SOB.  Pt required extra time to initiate and complete tasks and many more rest breaks this morning than previous sessions.  Pt required max A for stand pivot transfer to w/c to engage in BADL retraining including bathing and dressing with sit<>stand from w/c.  Pt required use of Stedy this morning for sit<>stand.  Pt only able to stand for approx 30 seconds this morning.  Pt incontinent of bowel during on sit<>stand and required tot A for hygiene.  Pt on 2L O2 today and O2 sats remained at 90% throughout session.  Pt required additional assistance and time this morning for all tasks.  Pt commented that he felt weaker this morning.  Focus on activity tolerance, sit<>stand, standing balance, BADL retraining, and safety awareness.  Therapy Documentation Precautions:  Precautions Precautions: Fall Restrictions Weight Bearing Restrictions: No Pain: Pain Assessment Pain Assessment: No/denies pain  See FIM for current functional status  Therapy/Group: Individual Therapy  [name], Brittnee Gaetano Chappell 08/07/2014, 8:00 AM

## 2014-08-07 NOTE — Progress Notes (Signed)
Pt experienced sudden gagging and coughing several minutes after taking medication with magic cup; had left room to get coffee and, returned to room,  thickened coffee for him to drink with dinner before the patient did this. Thick yellow colored phlegm returned without medications or ice cream substance noted. Symptoms disappeared as soon as secreations were expelled and pt able to speak clearer and did not cough anymore. PT did not cough with medications in puree. This is the second episode today however pt has reportedly experienced this during the evening hours and night shift several days in a row. Pt able to drink coffee without difficulty. Pamelia HoitSharp, Danell Verno B

## 2014-08-08 ENCOUNTER — Inpatient Hospital Stay (HOSPITAL_COMMUNITY): Payer: Medicare Other | Admitting: Occupational Therapy

## 2014-08-08 LAB — GLUCOSE, CAPILLARY
GLUCOSE-CAPILLARY: 137 mg/dL — AB (ref 70–99)
Glucose-Capillary: 127 mg/dL — ABNORMAL HIGH (ref 70–99)
Glucose-Capillary: 131 mg/dL — ABNORMAL HIGH (ref 70–99)
Glucose-Capillary: 130 mg/dL — ABNORMAL HIGH (ref 70–99)
Glucose-Capillary: 135 mg/dL — ABNORMAL HIGH (ref 70–99)
Glucose-Capillary: 159 mg/dL — ABNORMAL HIGH (ref 70–99)

## 2014-08-08 LAB — URINE CULTURE: Colony Count: 4000

## 2014-08-08 NOTE — Progress Notes (Signed)
[place] PHYSICAL MEDICINE & REHABILITATION     PROGRESS NOTE    Subjective/Complaints: No real complaints although appears a little more sob with therapy  A  review of systems has been performed and if not noted above is otherwise negative. Patient Insight is reduced however   Objective: Vital Signs: Blood pressure 150/72, pulse 107, temperature 98.6 F (37 C), temperature source Oral, resp. rate 19, height  (1.676 m), weight 79.1 kg (174 lb 6.1 oz), SpO2 98 %. Dg Chest 2 View  08/07/2014   CLINICAL DATA:  Fall, subdural hematoma, cough, history hypertension, hypercholesterolemia, former smoker  EXAM: CHEST  2 VIEW  COMPARISON:  07/30/2014  FINDINGS: Borderline enlargement of cardiac silhouette post aortic valve replacement.  Calcified thoracic aorta.  Mediastinal contours and pulmonary vascularity otherwise normal.  Emphysematous changes and bronchitic changes consistent with COPD.  Bibasilar atelectasis.  Lungs otherwise clear.  No pleural effusion or pneumothorax.  Bones demineralized.  IMPRESSION: COPD changes with bibasilar atelectasis.   Electronically Signed   By: Ulyses Southward M.D.   On: 08/07/2014 10:40    Recent Labs  08/07/14 1010  WBC 8.4  HGB 11.6*  HCT 36.5*  PLT 186   No results for input(s): NA, K, CL, GLUCOSE, BUN, CREATININE, CALCIUM in the last 72 hours.  Invalid input(s): CO CBG (last 3)   Recent Labs  08/08/14 0622 08/08/14 0727 08/08/14 1155  GLUCAP 127* 131* 137*    Wt Readings from Last 3 Encounters:  08/05/14 79.1 kg (174 lb 6.1 oz)  07/08/14 84.3 kg (185 lb 13.6 oz)  04/30/14 89.585 kg (197 lb 8 oz)    Physical Exam:  Constitutional: He is oriented to person, place, and time. He appears well-developed and well-nourished.  HENT: mucosa still pretty dry Head: Normocephalic and atraumatic.  Well healed left crani incision.  Eyes: Conjunctivae are normal. Pupils are equal, round, and reactive to light.  Neck: Normal range of motion.  Neck supple.  Cardiovascular: Normal rate and regular rhythm.  No murmur heard. Respiratory: appears a little more dsypneic. He has some crackles at the right base greater than left.  GI: Soft. Bowel sounds are normal. He exhibits no distension. There is no tenderness.  Musculoskeletal: He exhibits no edema or tenderness.  Neurological: He is alert and oriented to person, place, and time.  Continued dysarthria and dysphonia with expressive deficits. Decreased oro-motor control---but better.  apraxia noted with MMT. UE grossly 4/5 prox to distal. LE: 2+ hf, 3 ke and 4/5 ankle dorsi and plantarflexion. Sensory exam grossly intact Skin: Skin is warm and dry. Some scattered ecchymoses.   Psych: pt pleasant and generally appropriate  Assessment/Plan: 1. Functional deficits secondary to left SDH complicated by deconditioning from multiple medical issues which require 3+ hours per day of interdisciplinary therapy in a comprehensive inpatient rehab setting. Physiatrist is providing close team supervision and 24 hour management of active medical problems listed below. Physiatrist and rehab team continue to assess barriers to discharge/monitor patient progress toward functional and medical goals. FIM: FIM - Bathing Bathing Steps Patient Completed: Chest, Right Arm, Left Arm, Abdomen Bathing: 2: Max-Patient completes 3-4 67f 10 parts or 25-49%  FIM - Upper Body Dressing/Undressing Upper body dressing/undressing steps patient completed: Thread/unthread right sleeve of pullover shirt/dresss, Thread/unthread left sleeve of pullover shirt/dress Upper body dressing/undressing: 3: Mod-Patient completed 50-74% of tasks FIM - Lower Body Dressing/Undressing Lower body dressing/undressing: 1: Two helpers  FIM - Toileting Toileting steps completed by patient: Performs perineal hygiene  Toileting: 1: Two helpers  FIM - Diplomatic Services operational officerToilet Transfers Toilet Transfers Assistive Devices: Therapist, musicGrab bars Toilet Transfers:  3-From toilet/BSC: Mod A (lift or lower assist), 3-To toilet/BSC: Mod A (lift or lower assist)  FIM - BankerBed/Chair Transfer Bed/Chair Transfer Assistive Devices: Arm rests Bed/Chair Transfer: 2: Chair or W/C > Bed: Max A (lift and lower assist), 2: Bed > Chair or W/C: Max A (lift and lower assist)  FIM - Locomotion: Wheelchair Distance: 100 Locomotion: Wheelchair: 1: Total Assistance/staff pushes wheelchair (Pt<25%) FIM - Locomotion: Ambulation Locomotion: Ambulation Assistive Devices: Parallel bars Ambulation/Gait Assistance: Not tested (comment) Locomotion: Ambulation: 0: Activity did not occur  Comprehension Comprehension Mode: Auditory Comprehension: 5-Follows basic conversation/direction: With extra time/assistive device  Expression Expression Mode: Verbal Expression: 5-Expresses basic needs/ideas: With extra time/assistive device  Social Interaction Social Interaction: 6-Interacts appropriately with others with medication or extra time (anti-anxiety, antidepressant).  Problem Solving Problem Solving Mode: Not assessed Problem Solving: 4-Solves basic 75 - 89% of the time/requires cueing 10 - 24% of the time  Memory Memory: 4-Recognizes or recalls 75 - 89% of the time/requires cueing 10 - 24% of the time Medical Problem List and Plan: 1. Functional deficits secondary to Left SDH complicated by acute respiratory failure 2. DVT Prophylaxis/Anticoagulation: Mechanical: Sequential compression devices, below knee Bilateral lower extremities 3. Pain Management: tylenol prn 4. Mood: Continue remeron at bedtime. LCSW to follow for evaluation and support.  5. Neuropsych: This patient is not capable of making decisions on his own behalf. 6. Skin/Wound Care: Routine pressure relief measures.  7. Fluids/Electrolytes/Nutrition: Monitor I/O.    - Continue pro stat supplement tid. Offer ensure pudding or magic cup between meals.  -encourage PO   -BMET within normal limits  today  -added low dose megace 8. Hematuria: Has h/o BPH--sees Dr. Saddie Bendershao in [place]. Continue flomax and finasteride. Monitor voiding with PVR check. Monitor H/H.   -HGB trending up 9. Reactive airway disease: Was on nocturnal oxygen with nebs bid PTA.   -follow up cxr stable to improved  -decreased prednisone to  5 mg daily   -IS, coughing, OOB, biotene for oral mucosa 10. HTN:   Continue metoprolol bid.  11. Seizure prophylaxis: Will continue Keppra bid 12. Acute respiratory failure, low grade temp: Continue budesonide nebs bid. Encourage IS every 4 hours while awake.   -oob, encouraged him to cough up secretions,  Mucomyst stopped  -continue levaquin through today for bronchitis  -discussed chest PT with therapy.  Not sure how helpful it will be given his baseline airway disease, but I'm willing to give it a trial.     LOS (Days) 11 A FACE TO FACE EVALUATION WAS PERFORMED  SWARTZ,ZACHARY T 08/08/2014 12:19 PM

## 2014-08-08 NOTE — Progress Notes (Signed)
Occupational Therapy Session Note  Patient Details  Name: Roy Mitchell P Vlcek MRN: [id number] Date of Birth: 08/28/1923  Today's Date: 08/08/2014 OT Individual Time: 1005-1105 OT Individual Time Calculation (min): 60 min    Short Term Goals: Week 2:  OT Short Term Goal 1 (Week 2): Pt will perform LB dressing with max A OT Short Term Goal 2 (Week 2): Pt will perform toileting tasks with max A OT Short Term Goal 3 (Week 2): Pt will perform toilet transfers with max A  OT Short Term Goal 4 (Week 2): Pt will perform sit<>stand at sink with mod A in preparation for pulling up pants  Skilled Therapeutic Interventions/Progress Updates:   Patient seen for ADL retraining and instruction this morning in hospital room.  Patient educated on purpose of therapy session and patient agreeable to treatment.  Patient [facility] well [facility], [facility] session.  Patient completes supine to sit EOB with max assist.  Reports dizziness which clears after a minute.  BP taken and was 109/61 with HR 94 and O2 97% on O2 nasal cannula.  Patient attempts to transfer via stand pivot + 2 assist; unable.  Patient attempts sit to/from stand from bed to STEADY.  Able to achieve upright position; however, patient unable to follow directions for forward posture to correctly position seat of STEADY so discontinued and reseated at EOB.  Patient with bowel incontinence.  Returned to supine and is dependent for hygiene and changing of brief for bowel incontinence.  Patient completes LB bathing bedlevel with HOB elevated fully.  Able to wash upper legs and some of lower legs but requires assistance for feet and peri area for thorough cleaning.  Patient completes with max assist.  Dependent for LB dressing.  Patient's wife enters room at end of session.  Patient dependent for doffing shirt.  Wife assists with patient washing UB and completing dressing.  Patient with good participation  but limited by debility and overall decreased activity tolerance, strength, postural control, and functional mobility.   Therapy Documentation Precautions:  Precautions Precautions: Fall Restrictions Weight Bearing Restrictions: No Vital Signs: Oxygen Therapy SpO2: 98 % O2 Device: Nasal Cannula O2 Flow Rate (L/min): 3 L/min Pain: Pain Assessment Pain Assessment: 0-10 Pain Score: Asleep Pain Type: Acute pain Pain Location: Head Pain Intervention(s): Medication (See eMAR);RN made aware  See FIM for current functional status  Therapy/Group: Individual Therapy  Eber HongBrown, Zabria Liss L 08/08/2014, 12:56 PM

## 2014-08-08 NOTE — Progress Notes (Signed)
Patient resting quietly in bed at this time, no S/S distress noted. O22L [place] in place, frequent audible rhonchi noted, clears with coughing. CXR done per orders 08/07/13, COPD with atelectisis. No further episodes of emesis this shift. UAC&S sent 08/07/14 , pending. PO ATB started per dayshift, no adverse effects noted. Patient AOX3, noted delayed responses. Patient aware of delay, and expresses frustration. Encouraged patient to take his time to formulate responses.  Incontinent of B/B, occasionally uses urinal, condom cath at bedtime. Uses call light appropriately, able to express needs. Dorna LeitzWorthington, Levonia Wolfley, RN

## 2014-08-09 ENCOUNTER — Inpatient Hospital Stay (HOSPITAL_COMMUNITY): Payer: Medicare Other | Admitting: *Deleted

## 2014-08-09 LAB — GLUCOSE, CAPILLARY
GLUCOSE-CAPILLARY: 113 mg/dL — AB (ref 70–99)
GLUCOSE-CAPILLARY: 173 mg/dL — AB (ref 70–99)
Glucose-Capillary: 119 mg/dL — ABNORMAL HIGH (ref 70–99)
Glucose-Capillary: 130 mg/dL — ABNORMAL HIGH (ref 70–99)

## 2014-08-09 NOTE — Progress Notes (Signed)
[place] PHYSICAL MEDICINE & REHABILITATION     PROGRESS NOTE    Subjective/Complaints: Slept 6-7 hours. Denies pain. No new sob A  review of systems has been performed and if not noted above is otherwise negative. Patient Insight is reduced however   Objective: Vital Signs: Blood pressure 112/66, pulse 81, temperature 98.7 F (37.1 C), temperature source Oral, resp. rate 20, height 5\' 6"  (1.676 m), weight 79.1 kg (174 lb 6.1 oz), SpO2 93 %. Dg Chest 2 View  08/07/2014   CLINICAL DATA:  Fall, subdural hematoma, cough, history hypertension, hypercholesterolemia, former smoker  EXAM: CHEST  2 VIEW  COMPARISON:  07/30/2014  FINDINGS: Borderline enlargement of cardiac silhouette post aortic valve replacement.  Calcified thoracic aorta.  Mediastinal contours and pulmonary vascularity otherwise normal.  Emphysematous changes and bronchitic changes consistent with COPD.  Bibasilar atelectasis.  Lungs otherwise clear.  No pleural effusion or pneumothorax.  Bones demineralized.  IMPRESSION: COPD changes with bibasilar atelectasis.   Electronically Signed   By: Ulyses SouthwardMark  Boles M.D.   On: 08/07/2014 10:40    Recent Labs  08/07/14 1010  WBC 8.4  HGB 11.6*  HCT 36.5*  PLT 186   No results for input(s): NA, K, CL, GLUCOSE, BUN, CREATININE, CALCIUM in the last 72 hours.  Invalid input(s): CO CBG (last 3)   Recent Labs  08/08/14 1720 08/08/14 2107 08/09/14 0702  GLUCAP 135* 159* 113*    Wt Readings from Last 3 Encounters:  08/05/14 79.1 kg (174 lb 6.1 oz)  07/08/14 84.3 kg (185 lb 13.6 oz)  04/30/14 89.585 kg (197 lb 8 oz)    Physical Exam:  Constitutional: He is oriented to person, place, and time. He appears well-developed and well-nourished.  HENT: mucosa still pretty dry Head: Normocephalic and atraumatic.  Well healed left crani incision.  Eyes: Conjunctivae are normal. Pupils are equal, round, and reactive to light.  Neck: Normal range of motion. Neck supple.   Cardiovascular: Normal rate and regular rhythm.  No murmur heard. Respiratory: appears a little more dsypneic. He has some crackles at the right base greater than left.  GI: Soft. Bowel sounds are normal. He exhibits no distension. There is no tenderness.  Musculoskeletal: He exhibits no edema or tenderness.  Neurological: He is alert and oriented to person, place, and time.  Continued dysarthria and dysphonia with expressive deficits. Decreased oro-motor control---but better.  apraxia noted with MMT. UE grossly 4/5 prox to distal. LE: 2+ hf, 3 ke and 4/5 ankle dorsi and plantarflexion. Sensory exam grossly intact Skin: Skin is warm and dry. Some scattered ecchymoses.   Psych: pt pleasant and generally appropriate  Assessment/Plan: 1. Functional deficits secondary to left SDH complicated by deconditioning from multiple medical issues which require 3+ hours per day of interdisciplinary therapy in a comprehensive inpatient rehab setting. Physiatrist is providing close team supervision and 24 hour management of active medical problems listed below. Physiatrist and rehab team continue to assess barriers to discharge/monitor patient progress toward functional and medical goals. FIM: FIM - Bathing Bathing Steps Patient Completed: Right upper leg, Left upper leg Bathing: 2: Max-Patient completes 3-4 3326f 10 parts or 25-49% (buttocks, peri area, B lower legs completed during session)  FIM - Upper Body Dressing/Undressing Upper body dressing/undressing steps patient completed: Thread/unthread right sleeve of pullover shirt/dresss, Thread/unthread left sleeve of pullover shirt/dress Upper body dressing/undressing: 3: Mod-Patient completed 50-74% of tasks FIM - Lower Body Dressing/Undressing Lower body dressing/undressing: 1: Total-Patient completed less than 25% of tasks  FIM -  Toileting Toileting steps completed by patient: Performs perineal hygiene Toileting: 1: Two helpers  FIM - Ambulance person Devices: Therapist, music Transfers: 3-From toilet/BSC: Mod A (lift or lower assist), 3-To toilet/BSC: Mod A (lift or lower assist)  FIM - Banker Devices: Arm rests Bed/Chair Transfer: 2: Chair or W/C > Bed: Max A (lift and lower assist), 2: Bed > Chair or W/C: Max A (lift and lower assist)  FIM - Locomotion: Wheelchair Distance: 100 Locomotion: Wheelchair: 1: Total Assistance/staff pushes wheelchair (Pt<25%) FIM - Locomotion: Ambulation Locomotion: Ambulation Assistive Devices: Parallel bars Ambulation/Gait Assistance: Not tested (comment) Locomotion: Ambulation: 0: Activity did not occur  Comprehension Comprehension Mode: Auditory Comprehension: 5-Follows basic conversation/direction: With extra time/assistive device  Expression Expression Mode: Verbal Expression: 5-Expresses basic needs/ideas: With extra time/assistive device  Social Interaction Social Interaction: 6-Interacts appropriately with others with medication or extra time (anti-anxiety, antidepressant).  Problem Solving Problem Solving Mode: Not assessed Problem Solving: 4-Solves basic 75 - 89% of the time/requires cueing 10 - 24% of the time  Memory Memory: 4-Recognizes or recalls 75 - 89% of the time/requires cueing 10 - 24% of the time Medical Problem List and Plan: 1. Functional deficits secondary to Left SDH complicated by acute respiratory failure 2. DVT Prophylaxis/Anticoagulation: Mechanical: Sequential compression devices, below knee Bilateral lower extremities 3. Pain Management: tylenol prn 4. Mood: Continue remeron at bedtime. LCSW to follow for evaluation and support.  5. Neuropsych: This patient is not capable of making decisions on his own behalf. 6. Skin/Wound Care: Routine pressure relief measures.  7. Fluids/Electrolytes/Nutrition: Monitor I/O.    - Continue pro stat supplement tid. Offer ensure pudding or magic  cup between meals.  -encourage PO   -BMET within normal limits today  -added low dose megace 8. Hematuria: Has h/o BPH--sees Dr. Saddie Benders in [place]. Continue flomax and finasteride. Monitor voiding with PVR check. Monitor H/H.   -HGB trending up 9. Reactive airway disease: Was on nocturnal oxygen with nebs bid PTA.   -follow up cxr stable to improved  -decreased prednisone to  5 mg daily   -IS, coughing, OOB, biotene for oral mucosa 10. HTN:   Continue metoprolol bid.  11. Seizure prophylaxis: Will continue Keppra bid 12. Acute respiratory failure, low grade temp: Continue budesonide nebs bid. Encourage IS every 4 hours while awake.   -oob, encouraged him to cough up secretions,  Mucomyst stopped  -completed levaquin  -discussed chest PT with therapy.  Not sure how helpful it will be given his baseline airway disease, but I'm willing to give it a trial.  -will order tomorrow     LOS (Days) 12 A FACE TO FACE EVALUATION WAS PERFORMED  [place] Fedora T 08/09/2014 9:16 AM

## 2014-08-09 NOTE — Progress Notes (Signed)
Occupational Therapy Session Note  Patient Details  Name: Roy Mitchell MRN: [id number] Date of Birth: 04-Nov-1923  Today's Date: 08/09/2014 OT Individual Time:  -   0900-1000  15mn)      Short Term Goals: Week 1:  OT Short Term Goal 1 (Week 1): Pt will peform bathing with Max A of 1 person in order to decrease assistance for self care task. OT Short Term Goal 1 - Progress (Week 1): Met OT Short Term Goal 2 (Week 1): Pt will perform LB dressing with Max A of 1 person for clothing management. OT Short Term Goal 2 - Progress (Week 1): Progressing toward goal OT Short Term Goal 3 (Week 1): Pt will perform UB dressing with Min A in order to increase independence in self care. OT Short Term Goal 3 - Progress (Week 1): Met OT Short Term Goal 4 (Week 1): Pt will decrease to min verbal cues for initiation of self care tasks in order to decrease cues required for functional tasks.  OT Short Term Goal 4 - Progress (Week 1): Met Week 2:  OT Short Term Goal 1 (Week 2): Pt will perform LB dressing with max A OT Short Term Goal 2 (Week 2): Pt will perform toileting tasks with max A OT Short Term Goal 3 (Week 2): Pt will perform toilet transfers with max A  OT Short Term Goal 4 (Week 2): Pt will perform sit<>stand at sink with mod A in preparation for pulling up pants Week 3:     Skilled Therapeutic Interventions/Progress Updates:    Pt. Lying in bed.  Went from supine to sit with mod assist.  Sat EOB for UB bathing and dressing.  Addressed postural control, midline control and  Balance.  Pt. Leaned forward with minimal assist when asking him to squeeze out his washclothe.  Explained proper body alignement and how leaning forward was beneficiary.  Placed pt at sink and did 4 sit to stand with total assist +2.  Pt came half way up; unable to straighten legs.  See Fim for Bathing and dressing levels.  Left pt in wc with call bell,phone within reach.   Family in room.        Therapy  Documentation Precautions:  Precautions Precautions: Fall Restrictions Weight Bearing Restrictions: No    Vital Signs: Therapy Vitals Pulse Rate: (!) 107 BP: 119/65 mmHg Oxygen Therapy SpO2: 93 % O2 Device: Nasal Cannula O2 Flow Rate (L/min): 2 L/min Pain:  Dizzy.  BP= 119/65             See FIM for current functional status  Therapy/Group: Individual Therapy  ELisa Roca1/24/2016, 11:06 AM

## 2014-08-10 ENCOUNTER — Inpatient Hospital Stay (HOSPITAL_COMMUNITY): Payer: Medicare Other

## 2014-08-10 ENCOUNTER — Inpatient Hospital Stay (HOSPITAL_COMMUNITY): Payer: Medicare Other | Admitting: Speech Pathology

## 2014-08-10 ENCOUNTER — Encounter (HOSPITAL_COMMUNITY): Payer: Medicare Other | Admitting: *Deleted

## 2014-08-10 LAB — GLUCOSE, CAPILLARY
GLUCOSE-CAPILLARY: 110 mg/dL — AB (ref 70–99)
GLUCOSE-CAPILLARY: 166 mg/dL — AB (ref 70–99)
Glucose-Capillary: 122 mg/dL — ABNORMAL HIGH (ref 70–99)
Glucose-Capillary: 131 mg/dL — ABNORMAL HIGH (ref 70–99)

## 2014-08-10 MED ORDER — POLYVINYL ALCOHOL 1.4 % OP SOLN
1.0000 [drp] | OPHTHALMIC | Status: DC | PRN
  Administered 2014-08-10 (×2): 1 [drp] via OPHTHALMIC
  Filled 2014-08-10: qty 15

## 2014-08-10 NOTE — Progress Notes (Signed)
Speech Language Pathology Daily Session Note  Patient Details  Name: Roy Mitchell P Leete MRN: [id number] Date of Birth: March 06, 1924  Today's Date: 08/10/2014 SLP Individual Time: 1100-1130 SLP Individual Time Calculation (min): 30 min  Short Term Goals: Week 2: SLP Short Term Goal 1 (Week 2): Pt will tolerate presentations of his currently prescribed diet with no overt s/s of aspiration with supervision cues for use of swallowing precautions.   SLP Short Term Goal 2 (Week 2): Pt will tolerate trials of regular water with no overt s/s of aspiration with supervision cues for use of swallowing precautions.  SLP Short Term Goal 3 (Week 2): Pt will improve speech intelligibility at the phrase level to >80 % accuracy with min cues for use of dysarthria strategies.   SLP Short Term Goal 4 (Week 2): Pt will improve functional word finding at the phrase level over 75% of observable opportunities with min cues for use of compensatory strategies.  SLP Short Term Goal 5 (Week 2): Pt will improve functional problem solving during basic, structured, therapeutic tasks over 75% of observable opportunities with mod cues.    Skilled Therapeutic Interventions: Skilled treatment session focused on cognitive-linguistic goals. Student facilitated session with Mod verbal and question cues during verbal description task targeting word finding and speech intelligibility. During task, patient demonstrated intact reading comprehension at the word level but demonstrated circumlocutions suspect due to word finding difficulty and decreased comprehension of the task. Patient required Min verbal cues to maintain intelligibility and vocal intensity in a moderately noisy environment during task at the phrase and sentence level. Patient left in wheelchair with quick release belt in place with all needs within reach. Continue current plan of care.  FIM:  Comprehension Comprehension Mode: Auditory Comprehension: 3-Understands basic 50 -  74% of the time/requires cueing 25 - 50%  of the time Expression Expression Mode: Verbal Expression: 4-Expresses basic 75 - 89% of the time/requires cueing 10 - 24% of the time. Needs helper to occlude trach/needs to repeat words. Social Interaction Social Interaction: 6-Interacts appropriately with others with medication or extra time (anti-anxiety, antidepressant). Problem Solving Problem Solving: 4-Solves basic 75 - 89% of the time/requires cueing 10 - 24% of the time Memory Memory: 3-Recognizes or recalls 50 - 74% of the time/requires cueing 25 - 49% of the time  Pain Pain Assessment Pain Assessment: No/denies pain  Therapy/Group: Individual Therapy  Tacey RuizKatherine Kallon Caylor 08/10/2014, 3:45 PM

## 2014-08-10 NOTE — Progress Notes (Signed)
Physical Therapy Session Note  Patient Details  Name: Roy Mitchell MRN: [id number] Date of Birth: 1924/04/04  Today's Date: 08/10/2014 PT Individual Time: 0908-1009 PT Individual Time Calculation (min): 61 min   Short Term Goals: Week 2:  PT Short Term Goal 1 (Week 2): pt will maintain dynamic sitting balance with (S) PT Short Term Goal 2 (Week 2): pt will perform bed mobility in hospital bed with minA 50% of the time and minimal cuing for sequencing PT Short Term Goal 3 (Week 2): pt will perform bed<>chair transfers with minA 50% of the time and min cuing for sequencing PT Short Term Goal 4 (Week 2): pt will perform sit<>stand from non-elevated surface with assistance of modAx1 PT Short Term Goal 5 (Week 2): pt will maintain standing for >30sec with assist of 1 person  Therapy Documentation Precautions:  Precautions Precautions: Fall Restrictions Weight Bearing Restrictions: No    Chest Physical Therapy Evaluation/Treatment performed today as follows:  MEDICAL DIAGNOSIS: left parietal SDH CHEST PT DIAGNOSIS: poor secretion management and mobilization  PRECAUTIONS: cardiac, seizure MD ORDERS: Chest PT with percussion.  Chest Imaging: COPD changes with bibasilar atelectasis 1/22 Intubation/Trach/Vent none  Past Medical History:  SUBJECTIVE: patient reports increased coughing and sputum production FUNCTIONAL STATUS: maximum to total assist for all functional activities. COGNITIVE STATUS: Alert and oriented x3  OBSERVATION: Breathing 75/25 anterior chest/diaphragm with initiation at anterior chest; 1:2 inspiration: expiration, shallow breathing with cues for appropriate technique. Accessory muscle use noted at anterior scalene bilaterally and trapezius Forward head, rounded shoulders, increase in thoracic kyphosis. Skin Integrity - skin turgor poor B UE, B UE, warm to palpation, pink with (-) cyanosis in fingers, (+) capillary refill, (-) clubbing  Lines - n/a O2 use: 2LO2 via  nasal cannula Chest PT Assessment Intervention: postural correction with focus on promoting effective inspiration/expiration ASSESSMENT INTERVENTION: Pre-intervention BP 94/64 HR 112 SaO2 94% 2LO2 via nasal cannula, RR: 28 Sputum: no sputum noted  Cough Spontaneous, wet, weak, nonproductive (at time of evaluation).  Auscultation in short sit (anterior fields) decreased breath sounds bilaterally intermittent rhonchi noted on inspiration right anterior lobe. (posterior fields) L upper/lower lobe: decreased breath sounds, and intermittent rhonchi noted on left lower lobe; R upper lobe & R middle lobe: decreased breath sounds noted throughout with rhonchi noted on expiration, R lower lobe: decreased breath sounds on inspiration and strong rhonchi noted on inspiration and expiration.   INTERVENTION: diaphragmatic breathing, modified postural drainage (B side lying with quarter turn anterior in left sidelying and quarter turn posterior in right sidelying) and percussion 3x2 minutes bilaterally; manual vibration supine three trials, with marked increase in sputum production noted.    POST INTERVENTION: seated at [facility] Post intervention BP 94/58 HR 104 O2 96% RR 22 Sputum: copious amounts, moderate thickness, yellow, no odor noted. Patient noted with 5 episodes of coughning and sputum production during and after percussion/vibration.  COUGH: spontaneous, wet, weak, productive Auscultation in short sit: Improved breath sounds throughout however decreased on inspiration throughout all lobes. Right diminished more than left. Intermittent rhonchi noted on expiration throughout right lung field.  ASSESSMENT: Patient responded well to percussion with rest breaks and re-education needed throughout the session. He demonstrated increased cough frequency and sputum production with interventions. He would benefit from continued chest PT including percussion and vibration in order to improve ventilation and perfusion, as well  as secretion mobilization and patient comfort in order to improve functional abilities.  PLAN OF CARE: Diaphragmatic breathing, modified postural drainage (positioning), incentive  spirometry, percussion,  huffing, tai chi, active cycle breathing  He would benefit from further skilled chest physical therapy to maximize efficiency of breathing as well as secretion management and mobilization. PRECAUTIONS/CONTRAINDICATIONS TO CHEST PT: cardiac, seizure    Therapy/Group: Individual Therapy  Retta Diones 08/10/2014, 10:40 AM

## 2014-08-10 NOTE — Progress Notes (Signed)
[place] PHYSICAL MEDICINE & REHABILITATION     PROGRESS NOTE    Subjective/Complaints: No new complaints. Has his chronic cough. Intake a little better A  review of systems has been performed and if not noted above is otherwise negative. Patient Insight is reduced however   Objective: Vital Signs: Blood pressure 133/64, pulse 107, temperature 97.6 F (36.4 C), temperature source Oral, resp. rate 18, height  (1.676 m), weight 79.1 kg (174 lb 6.1 oz), SpO2 95 %. No results found.  Recent Labs  08/07/14 1010  WBC 8.4  HGB 11.6*  HCT 36.5*  PLT 186   No results for input(s): NA, K, CL, GLUCOSE, BUN, CREATININE, CALCIUM in the last 72 hours.  Invalid input(s): CO CBG (last 3)   Recent Labs  08/09/14 1621 08/09/14 2013 08/10/14 0708  GLUCAP 173* 130* 110*    Wt Readings from Last 3 Encounters:  08/05/14 79.1 kg (174 lb 6.1 oz)  07/08/14 84.3 kg (185 lb 13.6 oz)  04/30/14 89.585 kg (197 lb 8 oz)    Physical Exam:  Constitutional: He is oriented to person, place, and time. He appears well-developed and well-nourished.  HENT: mucosa still pretty dry Head: Normocephalic and atraumatic.  Well healed left crani incision.  Eyes: Conjunctivae are normal. Pupils are equal, round, and reactive to light.  Neck: Normal range of motion. Neck supple.  Cardiovascular: Normal rate and regular rhythm.  No murmur heard. Respiratory: appears a little more dsypneic. He has some crackles at the right base greater than left.  GI: Soft. Bowel sounds are normal. He exhibits no distension. There is no tenderness.  Musculoskeletal: He exhibits no edema or tenderness.  Neurological: He is alert and oriented to person, place, and time.  Continued dysarthria and dysphonia with expressive deficits. Decreased oro-motor control---but better.  apraxia noted with MMT. UE grossly 4/5 prox to distal. LE: 2+ hf, 3 ke and 4/5 ankle dorsi and plantarflexion. Sensory exam grossly  intact Skin: Skin is warm and dry. Some scattered ecchymoses.   Psych: pt pleasant and generally appropriate  Assessment/Plan: 1. Functional deficits secondary to left SDH complicated by deconditioning from multiple medical issues which require 3+ hours per day of interdisciplinary therapy in a comprehensive inpatient rehab setting. Physiatrist is providing close team supervision and 24 hour management of active medical problems listed below. Physiatrist and rehab team continue to assess barriers to discharge/monitor patient progress toward functional and medical goals. FIM: FIM - Bathing Bathing Steps Patient Completed: Chest, Right Arm, Left Arm, Abdomen Bathing: 2: Max-Patient completes 3-4 53f 10 parts or 25-49%  FIM - Upper Body Dressing/Undressing Upper body dressing/undressing steps patient completed: Thread/unthread right sleeve of pullover shirt/dresss, Thread/unthread left sleeve of pullover shirt/dress Upper body dressing/undressing: 3: Mod-Patient completed 50-74% of tasks FIM - Lower Body Dressing/Undressing Lower body dressing/undressing: 1: Two helpers  FIM - Toileting Toileting steps completed by patient: Performs perineal hygiene Toileting: 1: Two helpers  FIM - Diplomatic Services operational officer Devices: Therapist, music Transfers: 1-Two helpers  FIM - Banker Devices: Bed rails, HOB elevated Bed/Chair Transfer: 1: Mechanical lift  FIM - Locomotion: Wheelchair Distance: 100 Locomotion: Wheelchair: 1: Total Assistance/staff pushes wheelchair (Pt<25%) FIM - Locomotion: Ambulation Locomotion: Ambulation Assistive Devices: Parallel bars Ambulation/Gait Assistance: Not tested (comment) Locomotion: Ambulation: 0: Activity did not occur  Comprehension Comprehension Mode: Auditory Comprehension: 5-Follows basic conversation/direction: With extra time/assistive device  Expression Expression Mode:  Verbal Expression: 5-Expresses basic needs/ideas: With extra time/assistive device  Social Interaction Social Interaction: 6-Interacts appropriately with others with medication or extra time (anti-anxiety, antidepressant).  Problem Solving Problem Solving Mode: Not assessed Problem Solving: 4-Solves basic 75 - 89% of the time/requires cueing 10 - 24% of the time  Memory Memory: 4-Recognizes or recalls 75 - 89% of the time/requires cueing 10 - 24% of the time Medical Problem List and Plan: 1. Functional deficits secondary to Left SDH complicated by acute respiratory failure 2. DVT Prophylaxis/Anticoagulation: Mechanical: Sequential compression devices, below knee Bilateral lower extremities 3. Pain Management: tylenol prn 4. Mood: Continue remeron at bedtime. LCSW to follow for evaluation and support.  5. Neuropsych: This patient is not capable of making decisions on his own behalf. 6. Skin/Wound Care: Routine pressure relief measures.  7. Fluids/Electrolytes/Nutrition: Monitor I/O.    - Continue pro stat supplement tid. Offer ensure pudding or magic cup between meals.  -encourage PO   -BMET within normal limits most recently  -added low dose megace 8. Hematuria: Has h/o BPH--sees Dr. Saddie Bendershao in [place]. Continue flomax and finasteride. Monitor voiding with PVR check. Monitor H/H.   -HGB trending up 9. Reactive airway disease: Was on nocturnal oxygen with nebs bid PTA.   -follow up cxr stable to improved  -decreased prednisone to  5 mg daily   -IS, coughing, OOB, biotene for oral mucosa--humidified Oxygen helped 10. HTN:   Continue metoprolol bid.  11. Seizure prophylaxis: Will continue Keppra bid 12. Acute respiratory failure, low grade temp: Continue budesonide nebs bid. Encourage IS every 4 hours while awake.   -oob, encouraged him to cough up secretions,  Mucomyst stopped  -completed levaquin  -discussed chest PT with therapy.  Not sure how helpful it will be given his  baseline airway disease, but I'm willing to give it a trial.   -trial of PT ordered for today   LOS (Days) 13 A FACE TO FACE EVALUATION WAS PERFORMED  Melquiades Kovar T 08/10/2014 8:33 AM

## 2014-08-10 NOTE — Progress Notes (Signed)
Occupational Therapy Session Note  Patient Details  Name: Theophilus KindsSherwood P Labat MRN: [id number] Date of Birth: Nov 18, 1923  Today's Date: 08/10/2014 OT Individual Time: 1300-1400  (make-up session) OT Individual Time Calculation (min): 60 min    Short Term Goals: Week 2:  OT Short Term Goal 1 (Week 2): Pt will perform LB dressing with max A OT Short Term Goal 2 (Week 2): Pt will perform toileting tasks with max A OT Short Term Goal 3 (Week 2): Pt will perform toilet transfers with max A  OT Short Term Goal 4 (Week 2): Pt will perform sit<>stand at sink with mod A in preparation for pulling up pants  Skilled Therapeutic Interventions/Progress Updates:    Make-up session: Pt seen for 1:1 OT session with focus on sit<>stand, activity tolerance, and sitting balance. Pt received sitting in w/c agreeable to therapy. Completed sit<>stand with use of Stedy with +2 assist (second person provided min A) then transferred to sitting EOB. Pt completed sit<>stand 2x with Stedy with max-mod A +1. Engaged in dynamic sitting balance activity while sitting unsupported EOB for 25 min. Pt reaching out of BOS laterally and anteriorly for targets with CGA-SBA for balance. Pt required multiple rest breaks due fatigue. SpO2 91-94% of 2L O2 throughout session with HR no greater than 106. Pt requesting to return to bed to rest, requiring max A for sit>supine. Pt left supine in bed with all needs in reach.   Therapy Documentation Precautions:  Precautions Precautions: Fall Restrictions Weight Bearing Restrictions: No General:   Vital Signs: Oxygen Therapy O2 Device: Nasal Cannula O2 Flow Rate (L/min): 2 L/min Pain: Pain Assessment Pain Assessment: No/denies pain Faces Pain Scale: Hurts a little bit Pain Location: Head Pain Orientation: Posterior Pain Descriptors / Indicators: Headache Patients Stated Pain Goal: 3 Pain Intervention(s): Medication (See eMAR)  See FIM for current functional  status  Therapy/Group: Individual Therapy  Daneil Danerkinson, Maliyah Willets N 08/10/2014, 2:05 PM

## 2014-08-10 NOTE — Progress Notes (Signed)
Occupational Therapy Session Note  Patient Details  Name: Roy Mitchell MRN: [id number] Date of Birth: 1924/01/19  Today's Date: 08/10/2014 OT Individual Time: 0700-0800 OT Individual Time Calculation (min): 60 min    Short Term Goals: Week 2:  OT Short Term Goal 1 (Week 2): Pt will perform LB dressing with max A OT Short Term Goal 2 (Week 2): Pt will perform toileting tasks with max A OT Short Term Goal 3 (Week 2): Pt will perform toilet transfers with max A  OT Short Term Goal 4 (Week 2): Pt will perform sit<>stand at sink with mod A in preparation for pulling up pants  Skilled Therapeutic Interventions/Progress Updates:    Pt resting in bed upon arrival and agreeable to engaging in BADL retraining including bathing and dressing with sit<>stand at sink.  Pt c/o dizziness after supine->sit EOB but resolved in approx 60 seconds. Pt noted with increased difficulty sitting EOB and required min A to maintain sitting balance.  Pt required use of Stedy for sit<>stand and transfer to w/c this morning.  Pt's O2 sats at 89%-90% on 2L O2 and HR at 120 after tasks of approx 30 seconds duration.  Pt required multiple rest breaks throughout session.  Pt performed sit<>stand with Stedy X 4 with duration of approx 20 seconds each time.  Pt required tot A + 2 with use of Stedy.  Focus on activity tolerance, sit<>stand, standing balance with BUE support, sitting balance, bed mobility, and safety awareness.  Therapy Documentation Precautions:  Precautions Precautions: Fall Restrictions Weight Bearing Restrictions: No   Pain:  Pt denies pain  See FIM for current functional status  Therapy/Group: Individual Therapy  Roy Mitchell, Roy Mitchell 08/10/2014, 8:01 AM

## 2014-08-10 NOTE — Progress Notes (Signed)
Physical Therapy Session Note  Patient Details  Name: Roy Mitchell MRN: [id number] Date of Birth: 1923-11-02  Today's Date: 08/10/2014 PT Individual Time: 1630-1700 PT Individual Time Calculation (min): 30 min   Short Term Goals: Week 2:  PT Short Term Goal 1 (Week 2): pt will maintain dynamic sitting balance with (S) PT Short Term Goal 2 (Week 2): pt will perform bed mobility in hospital bed with minA 50% of the time and minimal cuing for sequencing PT Short Term Goal 3 (Week 2): pt will perform bed<>chair transfers with minA 50% of the time and min cuing for sequencing PT Short Term Goal 4 (Week 2): pt will perform sit<>stand from non-elevated surface with assistance of modAx1 PT Short Term Goal 5 (Week 2): pt will maintain standing for >30sec with assist of 1 person  Skilled Therapeutic Interventions/Progress Updates:  1:1. Pt was received supine in bed with HOB raised. Session focused on bed mobility, functional transfers, patient education and w/c propulsion. Pt performed supine>EOB with use of hospital bed functions, minA for steadying assist and mod verbal cues for technique. Pt demonstrated limited recall of supine>EOB and quat pivot t/f bed>w/c from earlier session; pt and wife were educated on techniques for ease of transfers including utilizing hospital bed functions and scooting unilateral hips for scooting EOB. Pt required maxAx1 for squat pivot t/f EOB>w/c and +2 for safety and to stabilize chair. Pt propelled w/c 100' around unit with modA for stearing and verbal cues for hand placement and technique, requiring significantly increased time. Pt and wife both noted improvement in respiratory function since morning session, citing improved airway clearance and expulsion of secretions.  Pt left seated in w/c with quick release belt in place, wife in room and all needs within reach.  Therapy Documentation Precautions:  Precautions Precautions: Fall Restrictions Weight Bearing  Restrictions: No  See FIM for current functional status  Therapy/Group: Individual Therapy  Everlene Otherck, Suella Cogar 08/10/2014, 5:14 PM

## 2014-08-10 NOTE — Progress Notes (Signed)
Physical Therapy Make Up Session Note  Patient Details  Name: Roy Mitchell MRN: [id number] Date of Birth: [birth year]  Today's Date: 08/10/2014 PT Individual Time: 1430-1500 PT Individual Time Calculation (min): 30 min (make up session)  Short Term Goals: Week 2:  PT Short Term Goal 1 (Week 2): pt will maintain dynamic sitting balance with (S) PT Short Term Goal 2 (Week 2): pt will perform bed mobility in hospital bed with minA 50% of the time and minimal cuing for sequencing PT Short Term Goal 3 (Week 2): pt will perform bed<>chair transfers with minA 50% of the time and min cuing for sequencing PT Short Term Goal 4 (Week 2): pt will perform sit<>stand from non-elevated surface with assistance of modAx1 PT Short Term Goal 5 (Week 2): pt will maintain standing for >30sec with assist of 1 person  Skilled Therapeutic Interventions/Progress Updates:    Patient received semi-reclined in bed. Session focused on supine<>sit with use of hospital bed functions. Patient requires minA for supine>sit with HOB elevated and use of bed rails, modA for sit>supine with HOB slightly elevated and use of bed rails. Patient with difficulty lifting B LEs onto bed. Emphasis on sit>side sitting on elbow for transition from sit>supine. Education/discussion about use of hospital bed functions, patient appears to have no knowledge of how to use appropriately. Patient left semi-reclined in bed with all needs within reach and wife present.  Therapy Documentation Precautions:  Precautions Precautions: Fall Restrictions Weight Bearing Restrictions: No Vital Signs: Oxygen Therapy O2 Device: Nasal Cannula O2 Flow Rate (L/min): 2 L/min Pain: Pain Assessment Pain Assessment: No/denies pain Pain Score: 0-No pain Locomotion : Ambulation Ambulation/Gait Assistance: Not tested (comment)   See FIM for current functional status  Therapy/Group: Individual Therapy  Chipper HerbBridget S Lener Ventresca S. Schawn Byas, PT,  DPT 08/10/2014, 2:58 PM

## 2014-08-11 ENCOUNTER — Encounter (HOSPITAL_COMMUNITY): Payer: Medicare Other | Admitting: *Deleted

## 2014-08-11 ENCOUNTER — Inpatient Hospital Stay (HOSPITAL_COMMUNITY): Payer: Medicare Other

## 2014-08-11 ENCOUNTER — Inpatient Hospital Stay (HOSPITAL_COMMUNITY): Payer: Medicare Other | Admitting: Speech Pathology

## 2014-08-11 LAB — GLUCOSE, CAPILLARY
GLUCOSE-CAPILLARY: 129 mg/dL — AB (ref 70–99)
Glucose-Capillary: 128 mg/dL — ABNORMAL HIGH (ref 70–99)
Glucose-Capillary: 140 mg/dL — ABNORMAL HIGH (ref 70–99)
Glucose-Capillary: 127 mg/dL — ABNORMAL HIGH (ref 70–99)
Glucose-Capillary: 143 mg/dL — ABNORMAL HIGH (ref 70–99)

## 2014-08-11 MED ORDER — KETOTIFEN FUMARATE 0.025 % OP SOLN
1.0000 [drp] | Freq: Two times a day (BID) | OPHTHALMIC | Status: DC
  Administered 2014-08-11 – 2014-08-13 (×5): 1 [drp] via OPHTHALMIC
  Filled 2014-08-11 (×2): qty 5

## 2014-08-11 NOTE — Progress Notes (Signed)
Speech Language Pathology Daily Session Note  Patient Details  Name: Roy Mitchell MRN: [id number] Date of Birth: [birth year]  Today's Date: 08/11/2014 SLP Individual Time: 1100-1200 SLP Individual Time Calculation (min): 60 min  Short Term Goals: Week 2: SLP Short Term Goal 1 (Week 2): Pt will tolerate presentations of his currently prescribed diet with no overt s/s of aspiration with supervision cues for use of swallowing precautions.   SLP Short Term Goal 2 (Week 2): Pt will tolerate trials of regular water with no overt s/s of aspiration with supervision cues for use of swallowing precautions.  SLP Short Term Goal 3 (Week 2): Pt will improve speech intelligibility at the phrase level to >80 % accuracy with min cues for use of dysarthria strategies.   SLP Short Term Goal 4 (Week 2): Pt will improve functional word finding at the phrase level over 75% of observable opportunities with min cues for use of compensatory strategies.  SLP Short Term Goal 5 (Week 2): Pt will improve functional problem solving during basic, structured, therapeutic tasks over 75% of observable opportunities with mod cues.    Skilled Therapeutic Interventions: Skilled treatment session focused on cognitive-linguistic goals. Patient was reclined in bed and reported nausea and 'just not feeling well' but was agreeable to participate in therapy. Noted increased ronchi during phonation and increased rate of breathing with SpO2 saturations remaining WFL. Student facilitated session by providing extra time and Max A verbal and question cues during verbal description task targeting word finding, speech intelligibility, and categorization. Patient demonstrated limited mental flexibility when identifying differences between animals (i.e. unable to state difference between cow and horse because cow pictured had no udders) and needed Max multimodal cues for differentiation and categorization. Patient required Mod verbal cues to  sustain vocal intensity and intelligibility in a quiet environment at the phrase and sentence level. Patient left reclined in bed with all needs within reach and wife and daughter present. Continue with current plan of care.   FIM:  Comprehension Comprehension Mode: Auditory Comprehension: 3-Understands basic 50 - 74% of the time/requires cueing 25 - 50%  of the time Expression Expression: 3-Expresses basic 50 - 74% of the time/requires cueing 25 - 50% of the time. Needs to repeat parts of sentences. Social Interaction Social Interaction: 6-Interacts appropriately with others with medication or extra time (anti-anxiety, antidepressant). Problem Solving Problem Solving: 3-Solves basic 50 - 74% of the time/requires cueing 25 - 49% of the time Memory Memory: 3-Recognizes or recalls 50 - 74% of the time/requires cueing 25 - 49% of the time  Pain Pain Assessment Pain Assessment: No/denies pain  Therapy/Group: Individual Therapy  Tacey RuizKatherine Polette Nofsinger 08/11/2014, 2:40 PM

## 2014-08-11 NOTE — Progress Notes (Signed)
NUTRITION FOLLOW UP  INTERVENTION: Continue 30 ml Prostat po TID, each supplement provides 100 kcal and 15 grams of protein.  Continue Ensure Pudding po TID, each supplement provides 170 kcal and 4 grams of protein.  Provide Magic cup TID between meals, each supplement provides 290 kcal and 9 grams of protein.  Encourage adequate PO intake.  NUTRITION DIAGNOSIS: Inadequate PO intake related to decreased appetite as evidenced by meal completion of 50%; onging  Goal: Pt to meet >/= 90% of their estimated nutrition needs; progressing  Monitor:  PO intake, weight trends, labs, I/O's  81 y.o. male  Admitting Dx: Traumatic subdural hematoma with loss of consciousness of 6 hours to 24 hours  ASSESSMENT: Pt with history of DDD, HTN, vertigo, tinnitus,TVAR, oxygen dependent at nights, PAD who sustained a fall 2 days PTA on 06/26/14. CT head done revealing acute left parietal SDH.  Meal completion has been varied from 0-80%. Appetite has been improving. Pt has been consuming his Prostat and Ensure pudding. Will continue with current orders. Pt encouraged to eat his food at meals and to take his supplements.   Labs and medications reviewed. Weight stable.  Height: Ht Readings from Last 1 Encounters:  07/28/14 5\' 6"  (1.676 m)    Weight: Wt Readings from Last 1 Encounters:  08/05/14 174 lb 6.1 oz (79.1 kg)    BMI:  Body mass index is 28.16 kg/(m^2).  Re-Estimated Nutritional Needs: Kcal: 1850-2050 Protein: 85-95 grams Fluid: 1.8 - 2 L/day  Skin: incision on head, +1 LE edema  Diet Order: DIET DYS 3 with nectar thick liquids   Intake/Output Summary (Last 24 hours) at 08/11/14 1528 Last data filed at 08/11/14 0908  Gross per 24 hour  Intake    420 ml  Output    750 ml  Net   -330 ml    Last BM: 1/26  Labs:  No results for input(s): NA, K, CL, CO2, BUN, CREATININE, CALCIUM, MG, PHOS, GLUCOSE in the last 168 hours.  CBG (last 3)   Recent Labs  08/11/14 0650  08/11/14 1157 08/11/14 1414  GLUCAP 128* 140* 129*    Scheduled Meds: . antiseptic oral rinse  15 mL Mouth Rinse TID  . budesonide (PULMICORT) nebulizer solution  0.5 mg Nebulization BID  . feeding supplement (ENSURE)  1 Container Oral TID BM  . feeding supplement (PRO-STAT SUGAR FREE 64)  30 mL Oral TID WC  . finasteride  5 mg Oral QHS  . insulin aspart  0-5 Units Subcutaneous QHS  . insulin aspart  0-9 Units Subcutaneous TID WC  . ketotifen  1 drop Both Eyes BID  . levalbuterol  0.63 mg Nebulization TID PC & HS  . levETIRAcetam  250 mg Oral BID  . megestrol  400 mg Oral Daily  . metFORMIN  500 mg Oral BID WC  . metoprolol tartrate  25 mg Oral BID  . mirtazapine  15 mg Oral QHS  . modafinil  100 mg Oral Daily  . omega-3 acid ethyl esters  1 g Oral BID  . predniSONE  5 mg Oral Q breakfast    Continuous Infusions:   Past Medical History  Diagnosis Date  . Hypercholesterolemia   . Benign prostatic hypertrophy   . GERD (gastroesophageal reflux disease)   . DDD (degenerative disc disease), cervical   . Tinnitus   . Multinodular goiter   . Prediabetes   . DDD (degenerative disc disease), lumbar   . Hypertension   . Pulmonary nodules   .  Anxiety disorder     Past Surgical History  Procedure Laterality Date  . Aortic valve replacement  01/2014  . History of blood transfusion    . Hernia repair      6 months old  . Craniotomy Left 06/26/2014    Procedure: Left Parietal Craniotomy for Subdural Hematoma;  Surgeon: Barnett Abu, MD;  Location: [facility] NEURO [facility]S;  Service: Neurosurgery;  Laterality: Left;    Marijean Niemann, MS, RD, LDN Pager # [phone] After hours/ weekend pager # [phone]

## 2014-08-11 NOTE — Plan of Care (Signed)
Problem: RH Balance Goal: LTG Patient will maintain dynamic sitting balance (PT) LTG: Patient will maintain dynamic sitting balance with assistance during mobility activities (PT)  Downgraded goal due to decreased sitting balance overall requiring cues Goal: LTG Patient will maintain dynamic standing balance (PT) LTG: Patient will maintain dynamic standing balance with assistance during mobility activities (PT)  Downgraded goal due to continued generalized deconditioned status.  Problem: RH Bed to Chair Transfers Goal: LTG Patient will perform bed/chair transfers w/assist (PT) LTG: Patient will perform bed/chair transfers with assistance, with/without cues (PT).  Downgraded goal due to decreased strength and cognition.  Problem: RH Car Transfers Goal: LTG Patient will perform car transfers with assist (PT) LTG: Patient will perform car transfers with assistance (PT).  Downgraded goal due to decreased strength and cognition.  Problem: RH Furniture Transfers Goal: LTG Patient will perform furniture transfers w/assist (OT/PT LTG: Patient will perform furniture transfers with assistance (OT/PT).  Downgraded goal due to decreased strength and cognition.  Problem: RH Ambulation Goal: LTG Patient will ambulate in controlled environment (PT) LTG: Patient will ambulate in a controlled environment, # of feet with assistance (PT).  Outcome: Not Applicable Date Met:  31/42/76 Goal discharged, not appropriate due to decreased functional strength and mobility in B LE.  Problem: RH Wheelchair Mobility Goal: LTG Patient will propel w/c in controlled environment (PT) LTG: Patient will propel wheelchair in controlled environment, # of feet with assist (PT)  Downgraded goal due to decreased strength and endurance. Goal: LTG Patient will propel w/c in home environment (PT) LTG: Patient will propel wheelchair in home environment, # of feet with assistance (PT).  Downgraded goal due to decreased  strength and endurance.  Problem: RH Stairs Goal: LTG Patient will ambulate up and down stairs w/assist (PT) LTG: Patient will ambulate up and down # of stairs with assistance (PT)  Outcome: Not Applicable Date Met:  70/11/00 Goal discharged due to continued decreased B LE strength.  Problem: RH Memory Goal: LTG Patient demonstrate ability for day to day recall (PT) LTG: Patient will demonstrate ability for day to day recall/carryover during mobility activities with assist (PT)  Goal downgraded due to current cognitive status of the patient.

## 2014-08-11 NOTE — Progress Notes (Signed)
[place] PHYSICAL MEDICINE & REHABILITATION     PROGRESS NOTE    Subjective/Complaints: Slept well. Breathing stable---PT may have helped. Appetite better A  review of systems has been performed and if not noted above is otherwise negative. Patient Insight is reduced however   Objective: Vital Signs: Blood pressure 136/77, pulse 107, temperature 97.8 F (36.6 C), temperature source Oral, resp. rate 16, height  (1.676 m), weight 79.1 kg (174 lb 6.1 oz), SpO2 90 %. No results found. No results for input(s): WBC, HGB, HCT, PLT in the last 72 hours. No results for input(s): NA, K, CL, GLUCOSE, BUN, CREATININE, CALCIUM in the last 72 hours.  Invalid input(s): CO CBG (last 3)   Recent Labs  08/10/14 1617 08/10/14 2053 08/11/14 0650  GLUCAP 122* 131* 128*    Wt Readings from Last 3 Encounters:  08/05/14 79.1 kg (174 lb 6.1 oz)  07/08/14 84.3 kg (185 lb 13.6 oz)  04/30/14 89.585 kg (197 lb 8 oz)    Physical Exam:  Constitutional: He is oriented to person, place, and time. He appears well-developed and well-nourished.  HENT: mucosa still pretty dry Head: Normocephalic and atraumatic.  Well healed left crani incision.  Eyes: Conjunctivae are normal. Pupils are equal, round, and reactive to light.  Neck: Normal range of motion. Neck supple.  Cardiovascular: Normal rate and regular rhythm.  No murmur heard. Respiratory: appears a little more dsypneic. He has some crackles at the right base greater than left.  GI: Soft. Bowel sounds are normal. He exhibits no distension. There is no tenderness.  Musculoskeletal: He exhibits no edema or tenderness.  Neurological: He is alert and oriented to person, place, and time.  Continued dysarthria and dysphonia with expressive deficits. Decreased oro-motor control---but better.  apraxia noted with MMT. UE grossly 4/5 prox to distal. LE: 2+ hf, 3 ke and 4/5 ankle dorsi and plantarflexion. Sensory exam grossly intact Skin:  Skin is warm and dry. Some scattered ecchymoses.   Psych: pt pleasant and generally appropriate  Assessment/Plan: 1. Functional deficits secondary to left SDH complicated by deconditioning from multiple medical issues which require 3+ hours per day of interdisciplinary therapy in a comprehensive inpatient rehab setting. Physiatrist is providing close team supervision and 24 hour management of active medical problems listed below. Physiatrist and rehab team continue to assess barriers to discharge/monitor patient progress toward functional and medical goals. FIM: FIM - Bathing Bathing Steps Patient Completed: Chest, Right Arm, Left Arm, Abdomen Bathing: 2: Max-Patient completes 3-4 28f 10 parts or 25-49%  FIM - Upper Body Dressing/Undressing Upper body dressing/undressing steps patient completed: Thread/unthread right sleeve of pullover shirt/dresss, Thread/unthread left sleeve of pullover shirt/dress Upper body dressing/undressing: 3: Mod-Patient completed 50-74% of tasks FIM - Lower Body Dressing/Undressing Lower body dressing/undressing: 1: Two helpers  FIM - Toileting Toileting steps completed by patient: Performs perineal hygiene Toileting: 1: Two helpers  FIM - Diplomatic Services operational officer Devices: Therapist, music Transfers: 1-Two helpers  FIM - Banker Devices: Bed rails, HOB elevated Bed/Chair Transfer: 4: Supine > Sit: Min A (steadying Pt. > 75%/lift 1 leg), 1: Two helpers, 1: Mechanical lift  FIM - Locomotion: Wheelchair Distance: 100 Locomotion: Wheelchair: 2: Travels 50 - 149 ft with moderate assistance (Pt: 50 - 74%) FIM - Locomotion: Ambulation Locomotion: Ambulation Assistive Devices: Parallel bars Ambulation/Gait Assistance: Not tested (comment) Locomotion: Ambulation: 0: Activity did not occur  Comprehension Comprehension Mode: Auditory Comprehension: 3-Understands basic 50 - 74% of the time/requires  cueing 25 - 50%  of the time  Expression Expression Mode: Verbal Expression: 4-Expresses basic 75 - 89% of the time/requires cueing 10 - 24% of the time. Needs helper to occlude trach/needs to repeat words.  Social Interaction Social Interaction: 6-Interacts appropriately with others with medication or extra time (anti-anxiety, antidepressant).  Problem Solving Problem Solving Mode: Not assessed Problem Solving: 4-Solves basic 75 - 89% of the time/requires cueing 10 - 24% of the time  Memory Memory: 3-Recognizes or recalls 50 - 74% of the time/requires cueing 25 - 49% of the time Medical Problem List and Plan: 1. Functional deficits secondary to Left SDH complicated by acute respiratory failure 2. DVT Prophylaxis/Anticoagulation: Mechanical: Sequential compression devices, below knee Bilateral lower extremities 3. Pain Management: tylenol prn 4. Mood: Continue remeron at bedtime. LCSW to follow for evaluation and support.  5. Neuropsych: This patient is not capable of making decisions on his own behalf. 6. Skin/Wound Care: Routine pressure relief measures.  7. Fluids/Electrolytes/Nutrition:  .    - Continue pro stat supplement tid. Offer ensure pudding or magic cup between meals.  -PO seems to be picking up  -BMET within normal limits most recently  -continue low dose megace 8. Hematuria: Has h/o BPH--sees Dr. Saddie Bendershao in [place]. Continue flomax and finasteride. Monitor voiding with PVR check. Monitor H/H.   -HGB trending up 9. Reactive airway disease: Was on nocturnal oxygen with nebs bid PTA.   -follow up cxr stable to improved  -decreased prednisone to  5 mg daily   -IS, coughing, OOB, biotene for oral mucosa--humidified Oxygen helped 10. HTN:   Continue metoprolol bid.  11. Seizure prophylaxis: Will continue Keppra bid 12. Acute respiratory failure, low grade temp: Continue budesonide nebs bid. Encourage IS every 4 hours while awake.   -oob, encouraged him to cough up  secretions,  Mucomyst stopped  -completed levaquin  -discussed chest PT with therapy.  Not sure how helpful it will be given his baseline airway disease, but I'm willing to give it a trial.   -trial of PT---continue as long as he displays a benefit  LOS (Days) 14 A FACE TO FACE EVALUATION WAS PERFORMED  Azaliyah Kennard T 08/11/2014 7:59 AM

## 2014-08-11 NOTE — Plan of Care (Signed)
Problem: RH Bathing Goal: LTG Patient will bathe with assist, cues/equipment (OT) LTG: Patient will bathe specified number of body parts with assist with/without cues using equipment (position) (OT)  Downgraded 1/26 due to slow progress in therapy  Problem: RH Dressing Goal: LTG Patient will perform upper body dressing (OT) LTG Patient will perform upper body dressing with assist, with/without cues (OT).  Downgraded 1/26 due to slow progress in therapy Goal: LTG Patient will perform lower body dressing w/assist (OT) LTG: Patient will perform lower body dressing with assist, with/without cues in positioning using equipment (OT)  Downgraded 1/26 due to slow progress in therapy  Problem: RH Toileting Goal: LTG Patient will perform toileting w/assist, cues/equip (OT) LTG: Patient will perform toiletiing (clothes management/hygiene) with assist, with/without cues using equipment (OT)  Downgraded 1/26 due to slow progress in therapy  Problem: RH Toilet Transfers Goal: LTG Patient will perform toilet transfers w/assist (OT) LTG: Patient will perform toilet transfers with assist, with/without cues using equipment (OT)  Downgraded 1/26 due to slow progress in therapy

## 2014-08-11 NOTE — Progress Notes (Signed)
Occupational Therapy Session Note  Patient Details  Name: Roy Mitchell MRN: [id number] Date of Birth: 06-Feb-1924  Today's Date: 08/11/2014 OT Individual Time: 0700-0800 OT Individual Time Calculation (min): 60 min    Short Term Goals: Week 2:  OT Short Term Goal 1 (Week 2): Pt will perform LB dressing with max A OT Short Term Goal 2 (Week 2): Pt will perform toileting tasks with max A OT Short Term Goal 3 (Week 2): Pt will perform toilet transfers with max A  OT Short Term Goal 4 (Week 2): Pt will perform sit<>stand at sink with mod A in preparation for pulling up pants  Skilled Therapeutic Interventions/Progress Updates:    Pt resting in bed upon arrival and agreeable to engaging in BADL retraining including bathing and dressing with sit<>stand from w/c at sink.  Pt incontinent of bowel and required tot A for toileting tasks.  Pt noted with increased fatigue this morning and required increased assistance when standing with Stedy.  Pt stood X 4 with the longest duration for approx 30 seconds.  Pt required mod A when standing with Stedy.  Pt required increased time during rest breaks this morning.  Pt's O2 sats at 87% on 2L O2 during activities.  Pt required increased assistance with BADLs this morning.  LTGs downgraded after consultation with OTR. Focus on activity tolerance, sit<>stand, standing tolerance, BADLs, and safety awareness.  Therapy Documentation Precautions:  Precautions Precautions: Fall Restrictions Weight Bearing Restrictions: No Pain: Pain Assessment Pain Assessment: No/denies pain  See FIM for current functional status  Therapy/Group: Individual Therapy  [name], Kariah Loredo Chappell 08/11/2014, 8:01 AM

## 2014-08-11 NOTE — Progress Notes (Signed)
Speech Language Pathology Weekly Progress Note  Patient Details  Name: Roy Mitchell MRN: [id number] Date of Birth: 07/09/1924  Beginning of progress report period: August 05, 2014 End of progress report period: August 11, 2013   Short Term Goals: Week 2: SLP Short Term Goal 1 (Week 2): Pt will tolerate presentations of his currently prescribed diet with no overt s/s of aspiration with supervision cues for use of swallowing precautions.   SLP Short Term Goal 1 - Progress (Week 2): Not met SLP Short Term Goal 2 (Week 2): Pt will tolerate trials of regular water with no overt s/s of aspiration with supervision cues for use of swallowing precautions.  SLP Short Term Goal 2 - Progress (Week 2): Not met SLP Short Term Goal 3 (Week 2): Pt will improve speech intelligibility at the phrase level to >80 % accuracy with min cues for use of dysarthria strategies.   SLP Short Term Goal 3 - Progress (Week 2): Not met SLP Short Term Goal 4 (Week 2): Pt will improve functional word finding at the phrase level over 75% of observable opportunities with min cues for use of compensatory strategies.  SLP Short Term Goal 4 - Progress (Week 2): Not met SLP Short Term Goal 5 (Week 2): Pt will improve functional problem solving during basic, structured, therapeutic tasks over 75% of observable opportunities with mod cues.   SLP Short Term Goal 5 - Progress (Week 2): Met    New Short Term Goals: Week 3: SLP Short Term Goal 1 (Week 3): Pt will consume Dys. 3 textures with nectar-thick liquids with minimal overt s/s of aspiration with supervision cues for use of swallowing precautions.   SLP Short Term Goal 2 (Week 3): Pt will tolerate trials of regular water with no overt s/s of aspiration with supervision cues for use of swallowing precautions.  SLP Short Term Goal 3 (Week 3): Pt will improve speech intelligibility at the phrase level to >80 % accuracy with min cues for use of dysarthria strategies.   SLP Short  Term Goal 4 (Week 3): Pt will improve functional word finding at the phrase level over 75% of observable opportunities with min cues for use of compensatory strategies.  SLP Short Term Goal 5 (Week 3): Pt will improve functional problem solving during basic, structured, therapeutic tasks over 75% of observable opportunities with min cues.    Weekly Progress Updates: Patient has made minimal and inconsistent gains and has met 1 of 5 STG's this reporting due to increased problem solving. Currently, patient is consuming Dys. 3 textures with nectar-thick liquids with intermittent overt s/s of aspiration and requires Min-Mod A for use of swallowing compensatory strategies. Trials of thin liquids were not attempted this week due to decreased respiratory status and decreased overall endurance. Patient requires Mod A multimodal cues for word-finding during basic conversations and Min-Mod A for use of speech intelligibility strategies at the phrase and sentence level, however, function is impacted by decreased breath support and overall endurance. Patient is also overall Min-Mod A for problem solving, working memory and awareness during basic and familiar tasks. Patient/family education ongoing. Patient would benefit from continued  skilled SLP intervention to maximize his cognitive-linguistic and swallowing function in order to maximize his overall functional independence prior to discharge.    Intensity: Minumum of 1-2 x/day, 30 to 90 minutes Frequency: 5 out of 7 days Duration/Length of Stay: 08/25/14 Treatment/Interventions: Cognitive remediation/compensation;Cueing hierarchy;Dysphagia/aspiration precaution training;Functional tasks;Patient/family education;Therapeutic Activities;Speech/Language facilitation;Oral motor exercises;Multimodal communication approach;Internal/external aids;Environmental controls  [place] Marney, [place] 08/11/2014, 4:21 PM

## 2014-08-11 NOTE — Progress Notes (Signed)
Physical Therapy Session Note  Patient Details  Name: Roy Mitchell MRN: [id number] Date of Birth: June 23, 1924  Today's Date: 08/11/2014 PT Individual Time: 1300-1415 PT Individual Time Calculation (min): 75 min   Short Term Goals: Week 2:  PT Short Term Goal 1 (Week 2): pt will maintain dynamic sitting balance with (S) PT Short Term Goal 2 (Week 2): pt will perform bed mobility in hospital bed with minA 50% of the time and minimal cuing for sequencing PT Short Term Goal 3 (Week 2): pt will perform bed<>chair transfers with minA 50% of the time and min cuing for sequencing PT Short Term Goal 4 (Week 2): pt will perform sit<>stand from non-elevated surface with assistance of modAx1 PT Short Term Goal 5 (Week 2): pt will maintain standing for >30sec with assist of 1 person  Skilled Therapeutic Interventions/Progress Updates:  1:1. Pt received semi-reclined in bed, noted to be very fatigued and not feeling well today but agreeable to participate as able. Focus this session on activity tolerance, chest PT, functional transfers and functional w/c propulsion. Pt req overall max A for t/f sup>sit x2 and sit>supx1 with use of hospital bed functions as well as SBT bed>w/c with overall mod-max cues for seq and safety. Chest PT completed, see details below. Pt with fair tolerance to w/c propulsion at end of session upon completion of chest PT. Pt able to propel w/c 100'x1 with B UE and overall supervision, demonstrating very slow but steady pace. Pt left sitting in w/c at end of session, noted overall less labored breathing at end vs. Start of session. All needs in reach, quick release belt in place, family and RN in room.    PRE-INTERVENTION  Vitals: BP 100/58 HR 104 SaO2 91% 2LO2 via nasal cannula, RR: 32 Sputum: no sputum noted  Cough Spontaneous, wet, weak, nonproductive Auscultation sitting EOB: - Anterior Fields:  decreased breath sounds bilaterally intermittent rhonchi noted on inspiration right  anterior lobe.  - Posterior Fields: L upper/lower lobe: decreased breath sounds, and intermittent rhonchi noted on left lower lobe; R upper/middle lobes: decreased breath sounds, rhonchi noted on expiration; R lower lobe: decreased breath sounds, crackles and rhonchi noted on inspiration and strong rhonchi noted on expiration.  INTERVENTION: modified postural drainage in L sidelying with quarter turn anteriorly; percussion 3x2 minutes to R middle/lower lobes; manual vibration, mild increase in sputum production noted.      POST INTERVENTION:  Vitals: BP 92/62 HR 103 O2 93% RR 36 Sputum: minimal amounts, mild thickness, light yellow, no odor noted. Patient noted with 3 episodes of coughing and sputum production during and after percussion/vibration.  COUGH: limited and spontaneous, wet, weak, mildly productive Auscultation sitting EOB: -Improved breath sounds throughout however decreased on inspiration throughout all lobes, absent crackles and rhonchi. Right diminished more than left. Decreased rhonchi noted on expiration throughout right lung field.  ASSESSMENT: Patient responded well to percussion with rest breaks and re-education needed throughout the session. He demonstrated mild increase in cough frequency and sputum production with interventions. He would benefit from continued chest PT including percussion and vibration in order to improve ventilation and perfusion, as well as secretion mobilization and patient comfort in order to improve functional abilities.   Therapy Documentation Precautions:  Precautions Precautions: Fall Restrictions Weight Bearing Restrictions: No Pain: Pain Assessment Pain Assessment: No/denies pain  See FIM for current functional status  Therapy/Group: Individual Therapy  Denzil HughesKing, Shaquinta Peruski S 08/11/2014, 3:19 PM

## 2014-08-11 NOTE — Plan of Care (Signed)
Problem: RH BOWEL ELIMINATION Goal: RH STG MANAGE BOWEL WITH ASSISTANCE STG Manage Bowel with mod Assistance.  Outcome: Not Progressing Incontinent-required total assist  Problem: RH SKIN INTEGRITY Goal: RH STG SKIN FREE OF INFECTION/BREAKDOWN Free of skin breakdown with min assist  Outcome: Not Progressing Redness to bottom-requires turn q2 prompting

## 2014-08-12 ENCOUNTER — Inpatient Hospital Stay (HOSPITAL_COMMUNITY): Payer: Medicare Other | Admitting: Physical Therapy

## 2014-08-12 ENCOUNTER — Encounter (HOSPITAL_COMMUNITY): Payer: Medicare Other | Admitting: *Deleted

## 2014-08-12 ENCOUNTER — Inpatient Hospital Stay (HOSPITAL_COMMUNITY): Payer: Medicare Other | Admitting: Speech Pathology

## 2014-08-12 ENCOUNTER — Inpatient Hospital Stay (HOSPITAL_COMMUNITY): Payer: Medicare Other | Admitting: *Deleted

## 2014-08-12 LAB — GLUCOSE, CAPILLARY
GLUCOSE-CAPILLARY: 130 mg/dL — AB (ref 70–99)
GLUCOSE-CAPILLARY: 128 mg/dL — AB (ref 70–99)
Glucose-Capillary: 497 mg/dL — ABNORMAL HIGH (ref 70–99)
Glucose-Capillary: 138 mg/dL — ABNORMAL HIGH (ref 70–99)
Glucose-Capillary: 124 mg/dL — ABNORMAL HIGH (ref 70–99)

## 2014-08-12 MED ORDER — LEVALBUTEROL HCL 0.63 MG/3ML IN NEBU
0.6300 mg | INHALATION_SOLUTION | Freq: Three times a day (TID) | RESPIRATORY_TRACT | Status: DC
  Administered 2014-08-12 – 2014-08-14 (×6): 0.63 mg via RESPIRATORY_TRACT
  Filled 2014-08-12 (×8): qty 3

## 2014-08-12 MED ORDER — ACETYLCYSTEINE 20 % IN SOLN
2.0000 mL | Freq: Two times a day (BID) | RESPIRATORY_TRACT | Status: DC
  Administered 2014-08-12: 4 mL via RESPIRATORY_TRACT
  Administered 2014-08-13 – 2014-08-14 (×3): 2 mL via RESPIRATORY_TRACT
  Filled 2014-08-12 (×7): qty 4

## 2014-08-12 NOTE — Progress Notes (Signed)
Physical Therapy Session Note  Patient Details  Name: Roy Mitchell MRN: [id number] Date of Birth: 04-16-1924  Today's Date: 08/12/2014 PT Individual Time: 1020-1123 PT Individual Time Calculation (min): 63 min   Short Term Goals: Week 2:  PT Short Term Goal 1 (Week 2): pt will maintain dynamic sitting balance with (S) PT Short Term Goal 2 (Week 2): pt will perform bed mobility in hospital bed with minA 50% of the time and minimal cuing for sequencing PT Short Term Goal 3 (Week 2): pt will perform bed<>chair transfers with minA 50% of the time and min cuing for sequencing PT Short Term Goal 4 (Week 2): pt will perform sit<>stand from non-elevated surface with assistance of modAx1 PT Short Term Goal 5 (Week 2): pt will maintain standing for >30sec with assist of 1 person Week 3:     Therapy Documentation Precautions:  Precautions Precautions: Fall Restrictions Weight Bearing Restrictions: No Pain: Pain Assessment Pain Assessment: No/denies pain Pain Score: 0-No pain    Chest PT Assessment Intervention: postural correction with focus on promoting effective inspiration/expiration ASSESSMENT INTERVENTION: Pre-intervention BP 96/62 HR 99 SaO2 95% 3LO2 via nasal cannula, RR: 32 Sputum: no sputum noted  Cough None.  Auscultation in short sit (anterior fields) decreased breath sounds bilaterally intermittent rhonchi noted on expiation right anterior lobe. (posterior fields) L upper/lower lobe: decreased breath sounds throughout no adventitious sounds noted; R upper lobe & R middle lobe: decreased breath sounds noted throughout with rhonchi noted on expiration, R lower lobe: decreased breath sounds on inspiration and rhonchi noted on and expiration.   INTERVENTION: diaphragmatic breathing, modified postural drainage (Left side lying with quarter turn anterior in left sidelying and percussion 3x2 minutes bilaterally; manual vibration supine two trials, with marked increase in sputum  production noted.    POST INTERVENTION: BP 94/58 HR 98 O2 99% 3LO2 via nasal cannula RR 26 Sputum: quarter sized amounts, moderate thickness, yellow, no odor noted. Patient noted with 4 episodes of coughing and sputum production during and after percussion/vibration.  COUGH: spontaneous, wet, weak, productive Auscultation in short sit: Improved breath sounds throughout however decreased on inspiration throughout all lobes. Right diminished more than left. Intermittent rhonchi noted on expiration on right lower lobe only.  ASSESSMENT: Patient responded well to percussion with rest breaks needed throughout the session. He demonstrated increased cough frequency and sputum production with interventions. He would benefit from continued chest PT including percussion and vibration in order to improve ventilation and perfusion, as well as secretion mobilization and patient comfort in order to improve functional abilities.   Transfers: Wheelchair to and from bed max assist stand pivot transfer. Sit to and from stand transfer with rolling walker mod assist  Standing Balance: Patient stood with rolling walker for 90 seconds with min assist. Focus on endurance, upright posture, controlled breathing, and strengthening.  Patient ambulated 2 steps with rolling walker max assistx1 with a close wheelchair follow for a two person assistance.   Patient attempted second ambulation trial however was unable secondary to increased fatigue.   Patient returned to room at end of session with all needs met resting in wheelchair with with quick release belt engaged and call bell within reach. Vitals monitored and remained stable throughout session responding appropriately with activity.    Therapy/Group: Individual Therapy  Retta Diones 08/12/2014, 11:31 AM

## 2014-08-12 NOTE — Plan of Care (Signed)
Problem: RH BLADDER ELIMINATION Goal: RH STG MANAGE BLADDER WITH ASSISTANCE STG Manage Bladder With min Assistance  Outcome: Not Progressing Incontinent     Problem: RH SKIN INTEGRITY Goal: RH STG SKIN FREE OF INFECTION/BREAKDOWN Free of skin breakdown with min assist  Outcome: Not Progressing Stage II sacrum -new Goal: RH STG MAINTAIN SKIN INTEGRITY WITH ASSISTANCE STG Maintain Skin Integrity With min Assistance.  Outcome: Not Progressing Requires max assist for turning in bed

## 2014-08-12 NOTE — Progress Notes (Signed)
[facility]     PROGRESS NOTE    Subjective/Complaints: No new complaints. Denies new shortness of breath. Staff as a whole noted that he sounded "wetter" yesterday. A  review of systems has been performed and if not noted above is otherwise negative. Patient Insight is reduced however   Objective: Vital Signs: Blood pressure 125/64, pulse 112, temperature 98.1 F (36.7 C), temperature source Oral, resp. rate 21, height 5\' 6"  (1.676 m), weight 79 kg (174 lb 2.6 oz), SpO2 92 %. No results found. No results for input(s): WBC, HGB, HCT, PLT in the last 72 hours. No results for input(s): NA, K, CL, GLUCOSE, BUN, CREATININE, CALCIUM in the last 72 hours.  Invalid input(s): CO CBG (last 3)   Recent Labs  08/11/14 2059 08/12/14 0657 08/12/14 0659  GLUCAP 143* 497* 130*    Wt Readings from Last 3 Encounters:  08/12/14 79 kg (174 lb 2.6 oz)  07/08/14 84.3 kg (185 lb 13.6 oz)  04/30/14 89.585 kg (197 lb 8 oz)    Physical Exam:  Constitutional: He is oriented to person, place, and time. He appears well-developed and well-nourished.  HENT: mucosa still pretty dry Head: Normocephalic and atraumatic.  Well healed left crani incision.  Eyes: Conjunctivae are normal. Pupils are equal, round, and reactive to light.  Neck: Normal range of motion. Neck supple.  Cardiovascular: Normal rate and regular rhythm.  No murmur heard. Respiratory: more rhonchi and upper airway, wet sounds today---no distress beyond baseline GI: Soft. Bowel sounds are normal. He exhibits no distension. There is no tenderness.  Musculoskeletal: He exhibits no edema or tenderness.  Neurological: He is alert and oriented to person, place, and time.  Continued dysarthria and dysphonia with expressive deficits. Decreased oro-motor control---but better.  apraxia noted with MMT. UE grossly 4/5 prox to distal. LE: 3- hf, 3 ke and 4/5 ankle dorsi and plantarflexion. Sensory exam  grossly intact Skin: Skin is warm and dry.    Psych: pt pleasant and generally appropriate  Assessment/Plan: 1. Functional deficits secondary to left SDH complicated by deconditioning from multiple medical issues which require 3+ hours per day of interdisciplinary therapy in a comprehensive inpatient rehab setting. Physiatrist is providing close team supervision and 24 hour management of active medical problems listed below. Physiatrist and rehab team continue to assess barriers to discharge/monitor patient progress toward functional and medical goals. FIM: FIM - Bathing Bathing Steps Patient Completed: Chest, Right Arm, Left Arm, Abdomen Bathing: 1: Total-Patient completes 0-2 of 10 parts or less than 25%  FIM - Upper Body Dressing/Undressing Upper body dressing/undressing steps patient completed: Thread/unthread right sleeve of pullover shirt/dresss, Thread/unthread left sleeve of pullover shirt/dress Upper body dressing/undressing: 3: Mod-Patient completed 50-74% of tasks FIM - Lower Body Dressing/Undressing Lower body dressing/undressing: 1: Two helpers  FIM - Toileting Toileting steps completed by patient: Performs perineal hygiene Toileting: 1: Two helpers  FIM - Diplomatic Services operational officerToilet Transfers Toilet Transfers Assistive Devices: Therapist, musicGrab bars Toilet Transfers: 1-Two helpers  FIM - BankerBed/Chair Transfer Bed/Chair Transfer Assistive Devices: Bed rails, HOB elevated, Sliding board Bed/Chair Transfer: 2: Supine > Sit: Max A (lifting assist/Pt. 25-49%), 2: Sit > Supine: Max A (lifting assist/Pt. 25-49%), 2: Bed > Chair or W/C: Max A (lift and lower assist)  FIM - Locomotion: Wheelchair Distance: 100 Locomotion: Wheelchair: 2: Travels 50 - 149 ft with supervision, cueing or coaxing FIM - Locomotion: Ambulation Locomotion: Ambulation Assistive Devices: Parallel bars Ambulation/Gait Assistance: Not tested (comment) Locomotion: Ambulation: 0: Activity did not  occur  Comprehension Comprehension  Mode: Auditory Comprehension: 3-Understands basic 50 - 74% of the time/requires cueing 25 - 50%  of the time  Expression Expression Mode: Verbal Expression: 3-Expresses basic 50 - 74% of the time/requires cueing 25 - 50% of the time. Needs to repeat parts of sentences.  Social Interaction Social Interaction: 6-Interacts appropriately with others with medication or extra time (anti-anxiety, antidepressant).  Problem Solving Problem Solving Mode: Not assessed Problem Solving: 3-Solves basic 50 - 74% of the time/requires cueing 25 - 49% of the time  Memory Memory: 3-Recognizes or recalls 50 - 74% of the time/requires cueing 25 - 49% of the time Medical Problem List and Plan: 1. Functional deficits secondary to Left SDH complicated by acute respiratory failure 2. DVT Prophylaxis/Anticoagulation: Mechanical: Sequential compression devices, below knee Bilateral lower extremities 3. Pain Management: tylenol prn 4. Mood: Continue remeron at bedtime. LCSW to follow for evaluation and support.  5. Neuropsych: This patient is not capable of making decisions on his own behalf. 6. Skin/Wound Care: Routine pressure relief measures.  7. Fluids/Electrolytes/Nutrition:  .    - Continue pro stat supplement tid. Offer ensure pudding or magic cup between meals.  -PO seems to be picking up  -BMET within normal limits most recently  -continue low dose megace 8. Hematuria: Has h/o BPH--sees Dr. Saddie Benders in [place]. Continue flomax and finasteride. Monitor voiding with PVR check. Monitor H/H.   -HGB trending up 9. Reactive airway disease: Was on nocturnal oxygen with nebs bid PTA.   -follow up cxr stable to improved  -decreased prednisone to  5 mg daily   -IS, coughing, OOB, biotene for oral mucosa--humidified Oxygen helped 10. HTN:   Continue metoprolol bid.  11. Seizure prophylaxis: Will continue Keppra bid 12. Acute respiratory failure, low grade temp: Continue budesonide nebs bid. Encourage IS  every 4 hours while awake.   -oob, encouraged him to cough up secretions,  Mucomyst stopped  -completed levaquin  -discussed chest PT with therapy.  Not sure how helpful it will be given his baseline airway disease, but I'm willing to give it a trial.   -trial of PT---continue as long as he displays a benefit  -resumed mucomyst at low dose as he's responded to this in past  LOS (Days) 15 A FACE TO FACE EVALUATION WAS PERFORMED  Takenya Travaglini T 08/12/2014 8:33 AM

## 2014-08-12 NOTE — Progress Notes (Signed)
Occupational Therapy Session Note  Patient Details  Name: Roy Mitchell MRN: [id number] Date of Birth: 09-Feb-1924  Today's Date: 08/12/2014 OT Individual Time: 0700-0800 OT Individual Time Calculation (min): 60 min    Short Term Goals: Week 2:  OT Short Term Goal 1 (Week 2): Pt will perform LB dressing with max A OT Short Term Goal 2 (Week 2): Pt will perform toileting tasks with max A OT Short Term Goal 3 (Week 2): Pt will perform toilet transfers with max A  OT Short Term Goal 4 (Week 2): Pt will perform sit<>stand at sink with mod A in preparation for pulling up pants  Skilled Therapeutic Interventions/Progress Updates:    Pt resting in bed upon arrival and agreeable to therapy.  Pt required mod A for supine->sit EOB and min A for sitting balance at EOB.  Pt transferred with use of Stedy with tot A + 2 for sit->stand (pt=approx 25%).  Pt's O2 sats at 81% on 2L O2.  RN notified and attended to patient.  Per respiratory therapist instructions to RN, breathing treatment administered with resulting O2 at 88% after treatment.  Pt required tot A for bathing and dressing tasks this morning.  Pt noted with later lean to left when sitting in w/c.  Focus on activity tolerance, sit<>stand, and sitting balance.  Therapy Documentation Precautions:  Precautions Precautions: Fall Restrictions Weight Bearing Restrictions: No Pain: Pain Assessment Pain Assessment: No/denies pain  See FIM for current functional status  Therapy/Group: Individual Therapy  [name], Raeshaun Simson Chappell 08/12/2014, 8:02 AM

## 2014-08-12 NOTE — Progress Notes (Signed)
Speech Language Pathology Daily Session Note  Patient Details  Name: Roy Mitchell MRN: [id number] Date of Birth: January 30, 1924  Today's Date: 08/12/2014 SLP Individual Time: 1300-1400 SLP Individual Time Calculation (min): 60 min  Short Term Goals: Week 3: SLP Short Term Goal 1 (Week 3): Pt will consume Dys. 3 textures with nectar-thick liquids with minimal overt s/s of aspiration with supervision cues for use of swallowing precautions.   SLP Short Term Goal 2 (Week 3): Pt will tolerate trials of regular water with no overt s/s of aspiration with supervision cues for use of swallowing precautions.  SLP Short Term Goal 3 (Week 3): Pt will improve speech intelligibility at the phrase level to >80 % accuracy with min cues for use of dysarthria strategies.   SLP Short Term Goal 4 (Week 3): Pt will improve functional word finding at the phrase level over 75% of observable opportunities with min cues for use of compensatory strategies.  SLP Short Term Goal 5 (Week 3): Pt will improve functional problem solving during basic, structured, therapeutic tasks over 75% of observable opportunities with min cues.    Skilled Therapeutic Interventions:   Skilled treatment session focused on cognitive-linguistic and dysphagia goals. Student facilitated session by providing extra time and Mod A verbal and question cues for problem-solving during a 4-step picture sequencing task. Patient also required Mod verbal cues to sustain vocal intensity and intelligibility in a quiet environment at the phrase and sentence level during verbal description task and functional conversation. Student also facilitated session through skilled observation with snack of chicken salad sandwich and required Min A verbal cues for utilization of a slow pace and small bites. Patient with intermittant coughing throughout session and snack, but difficult to differentiate possible swallowing difficulty versus mucous. Patient left reclined in bed  with spouse and daughter present. Continue with current plan of care.  FIM:  Comprehension Comprehension Mode: Auditory Comprehension: 3-Understands basic 50 - 74% of the time/requires cueing 25 - 50%  of the time Expression Expression Mode: Verbal Expression: 3-Expresses basic 50 - 74% of the time/requires cueing 25 - 50% of the time. Needs to repeat parts of sentences. Social Interaction Social Interaction: 6-Interacts appropriately with others with medication or extra time (anti-anxiety, antidepressant). Problem Solving Problem Solving: 3-Solves basic 50 - 74% of the time/requires cueing 25 - 49% of the time Memory Memory: 3-Recognizes or recalls 50 - 74% of the time/requires cueing 25 - 49% of the time FIM - Eating Eating Activity: 5: Supervision/cues;5: Set-up assist for open containers  Pain Pain Assessment Pain Assessment: No/denies pain Pain Score: 3  Pain Type: Acute pain Pain Location: Sacrum Pain Descriptors / Indicators: Aching Pain Frequency: Intermittent Pain Onset: Gradual Patients Stated Pain Goal: 3 Pain Intervention(s): Medication (See eMAR)  Therapy/Group: Individual Therapy  Tacey RuizKatherine Lyliana Dicenso 08/12/2014, 3:36 PM

## 2014-08-12 NOTE — Progress Notes (Signed)
Recreational Therapy Session Note  Patient Details  Name: Roy Mitchell MRN: [id number] Date of Birth: 02-Dec-1923 Today's Date: 08/12/2014  Multiple attempts to fully complete TR eval.  Pt with poor activity tolerance.  Pt on HOLD for TR services.  Will continue to visit pt for animal assisted therapy as pt enjoys these visits.  [name] 08/12/2014, 4:20 PM

## 2014-08-12 NOTE — Progress Notes (Signed)
Occupational Therapy Weekly Progress Note  Patient Details  Name: Roy Mitchell MRN: [id number] Date of Birth: 1924/03/25  Beginning of progress report period: August 05, 2014 End of progress report period: August 12, 2014     Patient has met 0 of 4 short term goals.  Pt initially demonstrated progress towards STGs at beginning of reporting period but has since declined in terms of ability to actively assist with BADLs.  Pt currently requires use of Stedy for transfers and when standing for LB BADLs.  Pt's activity tolerance is limited and he requires multiple lengthy rest breaks throughout sessions.  Pt's assist level varies between mod A to max A for bathing.  Pt is tot A for LB dressing tasks and mod A/min A for UB dressing tasks.  Pt requires the use of Stedy for toilet transfers and is frequently incontinent of bowel. Pt's LTGs have been downgraded per discussion with OTR.  Patient continues to demonstrate the following deficits: decreased postural control in sitting and standing, decreased balance, decreased strength, decreased coordination, decreased activity tolerance, decreased problem solving, decreased awareness and therefore will continue to benefit from skilled OT intervention to enhance overall performance with BADL, postural control, balance, activity tolerance, and ability to compensate for deficits.  Patient not progressing toward long term goals.  See goal revision..  Goals downgraded to mod-max assist overall.  OT Short Term Goals Week 2:  OT Short Term Goal 1 (Week 2): Pt will perform LB dressing with max A OT Short Term Goal 1 - Progress (Week 2): Progressing toward goal OT Short Term Goal 2 (Week 2): Pt will perform toileting tasks with max A OT Short Term Goal 2 - Progress (Week 2): Progressing toward goal OT Short Term Goal 3 (Week 2): Pt will perform toilet transfers with max A  OT Short Term Goal 3 - Progress (Week 2): Progressing toward goal OT Short Term Goal 4  (Week 2): Pt will perform sit<>stand at sink with mod A in preparation for pulling up pants OT Short Term Goal 4 - Progress (Week 2): Progressing toward goal Week 3:  OT Short Term Goal 1 (Week 3): Pt will perform LB dressing tasks with max A OT Short Term Goal 2 (Week 3): Pt will perform toileting tasks with max A OT Short Term Goal 3 (Week 3): Pt will perform toilet transfers with max A OT Short Term Goal 4 (Week 3): Pt will perform sit<>stand at sink with mod A in preparation for pulling up pants      Therapy Documentation Precautions:  Precautions Precautions: Fall Restrictions Weight Bearing Restrictions: No  See FIM for current functional status  Leroy Libman 08/12/2014, 8:38 AM

## 2014-08-12 NOTE — Patient Care Conference (Signed)
Inpatient RehabilitationTeam Conference and Plan of Care Update Date: 08/11/2014   Time: 2:10 PM    Patient Name: Roy Mitchell      Medical Record Number: [id number]  Date of Birth: 1924-07-13 Sex: Male         Room/Bed: 4W18C/4W18C-01 Payor Info: Payor: MEDICARE / Plan: MEDICARE PART A AND B / Product Type: *No Product type* /    Admitting Diagnosis: L PARIETAL SDH  CRANI  Admit Date/Time:  07/28/2014  3:48 PM Admission Comments: No comment available   Primary Diagnosis:  Traumatic subdural hematoma with loss of consciousness of 6 hours to 24 hours Principal Problem: Traumatic subdural hematoma with loss of consciousness of 6 hours to 24 hours  Patient Active Problem List   Diagnosis Date Noted  . Mild reactive airways disease 07/29/2014  . Dysphagia, pharyngoesophageal phase 07/29/2014  . DNR (do not resuscitate) 07/04/2014  . Lung collapse   . Acute respiratory failure with hypoxia 06/27/2014  . Acute respiratory failure   . Traumatic subdural hematoma with loss of consciousness of 6 hours to 24 hours 06/26/2014  . Abnormality of gait 06/26/2014  . HTN (hypertension) 06/26/2014  . Anxiety and depression 06/26/2014    Expected Discharge Date: Expected Discharge Date: 08/25/14 (vs. SNF)  Team Members Present: Physician leading conference: Dr. Faith RogueZachary Swartz Social Worker Present: Amada JupiterLucy Hoyle, LCSW Nurse Present: Carmie EndAngie Joyce, RN PT Present: Cyndia SkeetersBridgett Ripa, Scot JunPT;Caroline King, PT OT Present: Ardis Rowanom [name], COTA;Ahmari Garton Fredrich RomansSmith, OT;Kayla Perkinson, OT SLP Present: Feliberto Gottronourtney Payne, SLP PPS Coordinator present : Tora DuckMarie Noel, RN, CRRN     Current Status/Progress Goal Weekly Team Focus  Medical   ongoing respiratory issues, receiving chest PT. continues to sound wet at times  lung management,   lungs, nutrition, chest PT   Bowel/Bladder   Incontinent of bowel and bladder; preadmit baseline; using condom cath to conserve energy and prevent moisture.  Max assist  Use assistive devices  as needed   Swallow/Nutrition/ Hydration   Dys. 3 textures with nectar-thick liquids, Min A for use of swallow strategies   supervision with lest restrictive diet   increased use of swallow strategies, trials of thin liquids   ADL's   bathing-mod A; UB dressing-min/mod A; LB dressing-tot A; transfers-max A/tot A; decreased activity tolerance; decreased sitting balance  min A overall  activity tolerance, sitting balance, funcitonal transfers, sit<>stand   Mobility   Min-mod bed mobility with use of hospital bed functions; supervision-min A for w/c propulsion with increased time; mod-maxA of x1-2 persons for standing, frequently requiring use of Steady. Overall very poor functional endurance and tolerance to therapeutic activity, continues to be unable to safely progress to ambulation and stair negotiation.  LTG downgraded due to poor activity tolerance. Supervision for sitting balance; s-minA for w/c propulsion; mod A for bed mobility, transfers; max A for standing balance for short periods.  Functional endurance, activity tolerance, functional transfers, w/c propulsion, strength, pt/family education for safety during activites upon d/c   Communication   Min-Mod A  Min A  speech intellgibility and word-finding    Safety/Cognition/ Behavioral Observations  Mod A   Min A   basic problem solving, working memory, awareness    Pain   C/o soreness in buttocks; tylenol 650 mg po prn effective in controlling pain  < 3  Assess for pain q shift and prn   Skin   Stage II sacrum; barrier cream applied- dressing wouldnt be effective due to location  No further breakdown or infection with mod assist.  Assess skin q shift and prn; encourage pressure relief measures    Rehab Goals Patient on target to meet rehab goals: No Rehab Goals Revised: many of pt's goals were changed to moderate assistance and some to maximum assistance, compared to minimal assistance at w/c level *See Care Plan and progress notes for  long and short-term goals.  Barriers to Discharge: severe dysphagia    Possible Resolutions to Barriers:  ongoing diet mod, strategies    Discharge Planning/Teaching Needs:  Pt plans to go to his home with his wife and she will hire private duty caregivers as needed to help with pt's physical care.  Team is recommending that pt go to SNF, but family is adamant that home would be better for pt.  We plan to see how pt does this week and revisit the plan next week.  Pt's wife is present daily and can be available for family education.  Wife is welcome to have private duty caregivers come for family education, as well.   Team Discussion:  Pt will need daily nursing and around the clock care at home and will most likely need mod to max assist, therefore, team is recommending SNF for pt.  Pt is responding well to chest PT, but still with congestion.  PT/OT goals are downgraded to mod assist and team is feeling those may be difficult to achieve, with some goals being max assist.  Family re-educated on thickened liquids this week, as pt had ice in one of his thickened drinks.  Revisions to Treatment Plan:  Goals downgraded   Continued Need for Acute Rehabilitation Level of Care: The patient requires daily medical management by a physician with specialized training in physical medicine and rehabilitation for the following conditions: Daily direction of a multidisciplinary physical rehabilitation program to ensure safe treatment while eliciting the highest outcome that is of practical value to the patient.: Yes Daily medical management of patient stability for increased activity during participation in an intensive rehabilitation regime.: Yes Daily analysis of laboratory values and/or radiology reports with any subsequent need for medication adjustment of medical intervention for : Post surgical problems;Neurological problems;Pulmonary problems;Cardiac problems  Neola Worrall, Vista Deck 08/13/2014, 10:45  AM

## 2014-08-13 ENCOUNTER — Inpatient Hospital Stay (HOSPITAL_COMMUNITY): Payer: Medicare Other

## 2014-08-13 ENCOUNTER — Encounter (HOSPITAL_COMMUNITY): Payer: Medicare Other | Admitting: *Deleted

## 2014-08-13 ENCOUNTER — Inpatient Hospital Stay (HOSPITAL_COMMUNITY): Payer: Medicare Other | Admitting: *Deleted

## 2014-08-13 LAB — GLUCOSE, CAPILLARY
GLUCOSE-CAPILLARY: 122 mg/dL — AB (ref 70–99)
GLUCOSE-CAPILLARY: 130 mg/dL — AB (ref 70–99)
Glucose-Capillary: 166 mg/dL — ABNORMAL HIGH (ref 70–99)
Glucose-Capillary: 135 mg/dL — ABNORMAL HIGH (ref 70–99)

## 2014-08-13 NOTE — Plan of Care (Signed)
Problem: RH BLADDER ELIMINATION Goal: RH STG MANAGE BLADDER WITH ASSISTANCE STG Manage Bladder With min Assistance  Outcome: Not Progressing Patient wore briefs for incontinence at home; using condom cath at this time for energy conservation while in bed     Problem: RH SKIN INTEGRITY Goal: RH STG SKIN FREE OF INFECTION/BREAKDOWN Free of skin breakdown with min assist  Outcome: Not Progressing Air mattress ordered; stage II sacrum

## 2014-08-13 NOTE — Progress Notes (Signed)
Speech Language Pathology Daily Session Note  Patient Details  Name: Theophilus KindsSherwood P Bergland MRN: [id number] Date of Birth: [birth year]  Today's Date: 08/13/2014 SLP Concurrent Time: 1300-1400 SLP Concurrent Time Calculation (min): 60 min   Short Term Goals: Week 3: SLP Short Term Goal 1 (Week 3): Pt will consume Dys. 3 textures with nectar-thick liquids with minimal overt s/s of aspiration with supervision cues for use of swallowing precautions.   SLP Short Term Goal 2 (Week 3): Pt will tolerate trials of regular water with no overt s/s of aspiration with supervision cues for use of swallowing precautions.  SLP Short Term Goal 3 (Week 3): Pt will improve speech intelligibility at the phrase level to >80 % accuracy with min cues for use of dysarthria strategies.   SLP Short Term Goal 4 (Week 3): Pt will improve functional word finding at the phrase level over 75% of observable opportunities with min cues for use of compensatory strategies.  SLP Short Term Goal 5 (Week 3): Pt will improve functional problem solving during basic, structured, therapeutic tasks over 75% of observable opportunities with min cues.    Skilled Therapeutic Interventions: Skilled group treatment session focused on cognitive-linguistic goals. Upon arrival, patient was supine in bed but was agreeable to participate in treatment session. SLP facilitated session by transferring the patient to the wheelchair with +2 assist utilizing the [place]. Patient was reporting feeling "hot" with heavy perspiration which required a change of shirt. Patient was utilizing 3L of O2 via nasal cannula and his SpO2 saturations remained between 88-92% throughout the session. RN made aware. Patient participated in a verbal description task and required Mod A question cues for decreased circumlocution at the phrase-sentence level. Patient also required Mod A multimodal phonemic and verbal cues to self-monitor and correct phonemic paraphasias throughout the task.   Patient also generated appropriate item while other group members were verbally describing a word with extra time and Supervision verbal cues. Patient left in wheelchair with quick release belt in place. Continue with current plan of care.    FIM:  Comprehension Comprehension Mode: Auditory Comprehension: 3-Understands basic 50 - 74% of the time/requires cueing 25 - 50%  of the time Expression Expression Mode: Verbal Expression: 3-Expresses basic 50 - 74% of the time/requires cueing 25 - 50% of the time. Needs to repeat parts of sentences. Social Interaction Social Interaction: 6-Interacts appropriately with others with medication or extra time (anti-anxiety, antidepressant). Problem Solving Problem Solving: 3-Solves basic 50 - 74% of the time/requires cueing 25 - 49% of the time Memory Memory: 3-Recognizes or recalls 50 - 74% of the time/requires cueing 25 - 49% of the time FIM - Eating Eating Activity: 5: Supervision/cues  Pain No reports of pain throughout the session   Therapy/Group: Group Therapy  Adayah Arocho 08/13/2014, 2:18 PM

## 2014-08-13 NOTE — Progress Notes (Signed)
Social Work Patient ID: Wess Botts, male   DOB: 1923/11/04, 81 y.o.   MRN: [id number]  CSW met with pt, pt's wife, and pt's dtr to update them on team conference discussion.  CSW shared team recommendation for SNF care and pt's wife and dtr shared their concern over that, as pt had a very bad experience at a facility last summer.  It is their plan, at this time, for pt to go home with home health and 24/7 paid caregivers.  They ultimately want what's best for the pt and we decided to see how pt is doing in about a week with the efforts/treatments to try to improve pt's respiratory status and see if that improves pt's overall condition at all.  CSW will continue to follow and assist as needed.

## 2014-08-13 NOTE — Progress Notes (Signed)
Social Work Patient ID: Roy Mitchell, male   DOB: 05-16-1924, 81 y.o.   MRN: [id number]   Vista DeckJennifer Capps Duha Abair, LCSW Social Worker Signed  Patient Care Conference 08/12/2014  2:30 PM    Expand All Collapse All   Inpatient RehabilitationTeam Conference and Plan of Care Update Date: 08/11/2014   Time: 2:10 PM     Patient Name: Roy KindsSherwood P Prange       Medical Record Number: [id number]  Date of Birth: 05-16-1924 Sex: Male         Room/Bed: 4W18C/4W18C-01 Payor Info: Payor: MEDICARE / Plan: MEDICARE PART A AND B / Product Type: *No Product type* /    Admitting Diagnosis: L PARIETAL SDH  CRANI   Admit Date/Time:  07/28/2014  3:48 PM Admission Comments: No comment available   Primary Diagnosis:  Traumatic subdural hematoma with loss of consciousness of 6 hours to 24 hours Principal Problem: Traumatic subdural hematoma with loss of consciousness of 6 hours to 24 hours    Patient Active Problem List     Diagnosis  Date Noted   .  Mild reactive airways disease  07/29/2014   .  Dysphagia, pharyngoesophageal phase  07/29/2014   .  DNR (do not resuscitate)  07/04/2014   .  Lung collapse     .  Acute respiratory failure with hypoxia  06/27/2014   .  Acute respiratory failure     .  Traumatic subdural hematoma with loss of consciousness of 6 hours to 24 hours  06/26/2014   .  Abnormality of gait  06/26/2014   .  HTN (hypertension)  06/26/2014   .  Anxiety and depression  06/26/2014     Expected Discharge Date: Expected Discharge Date: 08/25/14 (vs. SNF)  Team Members Present: Physician leading conference: Dr. Faith RogueZachary Swartz Social Worker Present: Amada JupiterLucy Hoyle, LCSW Nurse Present: Carmie EndAngie Joyce, RN PT Present: Cyndia SkeetersBridgett Ripa, Scot JunPT;Caroline King, PT OT Present: Ardis Rowanom [name], COTA;Liann Spaeth Fredrich RomansSmith, OT;Kayla Perkinson, OT SLP Present: Feliberto Gottronourtney Payne, SLP PPS Coordinator present : Tora DuckMarie Noel, RN, CRRN        Current Status/Progress  Goal  Weekly Team Focus   Medical     ongoing respiratory issues,  receiving chest PT. continues to sound wet at times  lung management,   lungs, nutrition, chest PT   Bowel/Bladder     Incontinent of bowel and bladder; preadmit baseline; using condom cath to conserve energy and prevent moisture.  Max assist  Use assistive devices as needed   Swallow/Nutrition/ Hydration     Dys. 3 textures with nectar-thick liquids, Min A for use of swallow strategies   supervision with lest restrictive diet    increased use of swallow strategies, trials of thin liquids    ADL's     bathing-mod A; UB dressing-min/mod A; LB dressing-tot A; transfers-max A/tot A; decreased activity tolerance; decreased sitting balance   min A overall  activity tolerance, sitting balance, funcitonal transfers, sit<>stand   Mobility     Min-mod bed mobility with use of hospital bed functions; supervision-min A for w/c propulsion with increased time; mod-maxA of x1-2 persons for standing, frequently requiring use of Steady. Overall very poor functional endurance and tolerance to therapeutic activity, continues to be unable to safely progress to ambulation and stair negotiation.   LTG downgraded due to poor activity tolerance. Supervision for sitting balance; s-minA for w/c propulsion; mod A for bed mobility, transfers; max A for standing balance for short periods.   Functional endurance, activity tolerance, functional transfers,  w/c propulsion, strength, pt/family education for safety during activites upon d/c   Communication     Min-Mod A  Min A  speech intellgibility and word-finding     Safety/Cognition/ Behavioral Observations    Mod A   Min A   basic problem solving, working memory, awareness    Pain     C/o soreness in buttocks; tylenol 650 mg po prn effective in controlling pain  < 3  Assess for pain q shift and prn   Skin     Stage II sacrum; barrier cream applied- dressing wouldnt be effective due to location  No further breakdown or infection with mod assist.   Assess skin q shift and  prn; encourage pressure relief measures     Rehab Goals Patient on target to meet rehab goals: No Rehab Goals Revised: many of pt's goals were changed to moderate assistance and some to maximum assistance, compared to minimal assistance at w/c level *See Care Plan and progress notes for long and short-term goals.    Barriers to Discharge:  severe dysphagia     Possible Resolutions to Barriers:   ongoing diet mod, strategies     Discharge Planning/Teaching Needs:   Pt plans to go to his home with his wife and she will hire private duty caregivers as needed to help with pt's physical care.  Team is recommending that pt go to SNF, but family is adamant that home would be better for pt.  We plan to see how pt does this week and revisit the plan next week.   Pt's wife is present daily and can be available for family education.  Wife is welcome to have private duty caregivers come for family education, as well.    Team Discussion:    Pt will need daily nursing and around the clock care at home and will most likely need mod to max assist, therefore, team is recommending SNF for pt.  Pt is responding well to chest PT, but still with congestion.  PT/OT goals are downgraded to mod assist and team is feeling those may be difficult to achieve, with some goals being max assist.  Family re-educated on thickened liquids this week, as pt had ice in one of his thickened drinks.   Revisions to Treatment Plan:    Goals downgraded    Continued Need for Acute Rehabilitation Level of Care: The patient requires daily medical management by a physician with specialized training in physical medicine and rehabilitation for the following conditions: Daily direction of a multidisciplinary physical rehabilitation program to ensure safe treatment while eliciting the highest outcome that is of practical value to the patient.: Yes Daily medical management of patient stability for increased activity during participation in an  intensive rehabilitation regime.: Yes Daily analysis of laboratory values and/or radiology reports with any subsequent need for medication adjustment of medical intervention for : Post surgical problems;Neurological problems;Pulmonary problems;Cardiac problems  Meryem Haertel, Vista Deck 08/13/2014, 10:45 AM

## 2014-08-13 NOTE — Progress Notes (Signed)
[place] PHYSICAL MEDICINE & REHABILITATION     PROGRESS NOTE    Subjective/Complaints: Has 6-7 tissues in bed with him containing sputum/secretions. Feels wetter but breathing not necessarily worse A  review of systems has been performed and if not noted above is otherwise negative. Patient Insight is reduced however   Objective: Vital Signs: Blood pressure 131/70, pulse 116, temperature 98.1 F (36.7 C), temperature source Oral, resp. rate 22, height  (1.676 m), weight 79 kg (174 lb 2.6 oz), SpO2 93 %. No results found. No results for input(s): WBC, HGB, HCT, PLT in the last 72 hours. No results for input(s): NA, K, CL, GLUCOSE, BUN, CREATININE, CALCIUM in the last 72 hours.  Invalid input(s): CO CBG (last 3)   Recent Labs  08/12/14 1610 08/12/14 2115 08/13/14 0650  GLUCAP 128* 124* 122*    Wt Readings from Last 3 Encounters:  08/12/14 79 kg (174 lb 2.6 oz)  07/08/14 84.3 kg (185 lb 13.6 oz)  04/30/14 89.585 kg (197 lb 8 oz)    Physical Exam:  Constitutional: He is oriented to person, place, and time. He appears well-developed and well-nourished.  HENT: mucosa still pretty dry Head: Normocephalic and atraumatic.  Well healed left crani incision.  Eyes: Conjunctivae are normal. Pupils are equal, round, and reactive to light.  Neck: Normal range of motion. Neck supple.  Cardiovascular: Normal rate and regular rhythm.  No murmur heard. Respiratory: more rhonchi and upper airway, wet sounds today again. ? A little more labored GI: Soft. Bowel sounds are normal. He exhibits no distension. There is no tenderness.  Musculoskeletal: He exhibits no edema or tenderness.  Neurological: He is alert and oriented to person, place, and time.  Continued dysarthria and dysphonia with expressive deficits. Decreased oro-motor control---but better.  apraxia noted with MMT. UE grossly 4/5 prox to distal. LE: 3- hf, 3 ke and 4/5 ankle dorsi and plantarflexion. Sensory exam  grossly intact Skin: Skin is warm and dry.    Psych: pt pleasant and generally appropriate  Assessment/Plan: 1. Functional deficits secondary to left SDH complicated by deconditioning from multiple medical issues which require 3+ hours per day of interdisciplinary therapy in a comprehensive inpatient rehab setting. Physiatrist is providing close team supervision and 24 hour management of active medical problems listed below. Physiatrist and rehab team continue to assess barriers to discharge/monitor patient progress toward functional and medical goals. FIM: FIM - Bathing Bathing Steps Patient Completed: Chest, Right Arm, Left Arm, Abdomen Bathing: 2: Max-Patient completes 3-4 76f 10 parts or 25-49%  FIM - Upper Body Dressing/Undressing Upper body dressing/undressing steps patient completed: Thread/unthread right sleeve of pullover shirt/dresss, Thread/unthread left sleeve of pullover shirt/dress Upper body dressing/undressing: 3: Mod-Patient completed 50-74% of tasks FIM - Lower Body Dressing/Undressing Lower body dressing/undressing: 1: Two helpers  FIM - Toileting Toileting steps completed by patient: Performs perineal hygiene Toileting: 1: Two helpers  FIM - Diplomatic Services operational officer Devices: Therapist, music Transfers: 1-Two helpers  FIM - Banker Devices: Bed rails Bed/Chair Transfer: 1: Mechanical lift  FIM - Locomotion: Wheelchair Distance: 100 Locomotion: Wheelchair: 2: Travels 50 - 149 ft with supervision, cueing or coaxing FIM - Locomotion: Ambulation Locomotion: Ambulation Assistive Devices: Designer, industrial/product Ambulation/Gait Assistance: 2: Max assist Locomotion: Ambulation: 1: Travels less than 50 ft with maximal assistance (Pt: 25 - 49%)  Comprehension Comprehension Mode: Auditory Comprehension: 3-Understands basic 50 - 74% of the time/requires cueing 25 - 50%  of the time  Expression Expression Mode:  Verbal Expression: 3-Expresses basic 50 - 74% of the time/requires cueing 25 - 50% of the time. Needs to repeat parts of sentences.  Social Interaction Social Interaction: 6-Interacts appropriately with others with medication or extra time (anti-anxiety, antidepressant).  Problem Solving Problem Solving Mode: Not assessed Problem Solving: 3-Solves basic 50 - 74% of the time/requires cueing 25 - 49% of the time  Memory Memory: 3-Recognizes or recalls 50 - 74% of the time/requires cueing 25 - 49% of the time Medical Problem List and Plan: 1. Functional deficits secondary to Left SDH complicated by acute respiratory failure 2. DVT Prophylaxis/Anticoagulation: Mechanical: Sequential compression devices, below knee Bilateral lower extremities 3. Pain Management: tylenol prn 4. Mood: Continue remeron at bedtime. LCSW to follow for evaluation and support.  5. Neuropsych: This patient is not capable of making decisions on his own behalf. 6. Skin/Wound Care: Routine pressure relief measures.  7. Fluids/Electrolytes/Nutrition:  .    - Continue pro stat supplement tid. Offer ensure pudding or magic cup between meals.  -PO seems to be picking up  -BMET within normal limits most recently  -continue low dose megace to spark appetite 8. Hematuria: Has h/o BPH--sees Dr. Saddie Bendershao in [place]. Continue flomax and finasteride. Monitor voiding with PVR check. Monitor H/H.   -HGB trending up 9. Reactive airway disease: Was on nocturnal oxygen with nebs bid PTA.   -follow up cxr stable to improved  -decreased prednisone to  5 mg daily   -IS, coughing, OOB, biotene for oral mucosa--humidified Oxygen 10. HTN:   Continue metoprolol bid.  11. Seizure prophylaxis: Will continue Keppra bid 12. Acute respiratory failure, low grade temp: Continue budesonide nebs bid. Encourage IS every 4 hours while awake.   -oob, encouraged him to cough up secretions  -completed levaquin---has been more wet the last 2-3  days, with ?decreased pulmonary stamina---will re-check CXR today  -discussed chest PT with therapy.  Not sure how helpful it will be given his baseline airway disease, but I'm willing to give it a trial.   -trial of PT---continue as long as he displays a benefit  -resumed mucomyst at low dose as he's responded to this in past  LOS (Days) 16 A FACE TO FACE EVALUATION WAS PERFORMED  SWARTZ,ZACHARY T 08/13/2014 8:11 AM

## 2014-08-13 NOTE — Progress Notes (Signed)
Physical Therapy Session Note  Patient Details  Name: Roy Mitchell MRN: [id number] Date of Birth: 04-Jun-1924  Today'Mitchell Date: 08/13/2014 PT Individual Time: [id number] PT Individual Time Calculation (min): 40 min   Short Term Goals: Week 2:  PT Short Term Goal 1 (Week 2): pt will maintain dynamic sitting balance with (Mitchell) PT Short Term Goal 1 - Progress (Week 2): Progressing toward goal PT Short Term Goal 2 (Week 2): pt will perform bed mobility in hospital bed with minA 50% of the time and minimal cuing for sequencing PT Short Term Goal 2 - Progress (Week 2): Progressing toward goal PT Short Term Goal 3 (Week 2): pt will perform bed<>chair transfers with minA 50% of the time and min cuing for sequencing PT Short Term Goal 3 - Progress (Week 2): Progressing toward goal PT Short Term Goal 4 (Week 2): pt will perform sit<>stand from non-elevated surface with assistance of modAx1 PT Short Term Goal 4 - Progress (Week 2): Progressing toward goal PT Short Term Goal 5 (Week 2): pt will maintain standing for >30sec with assist of 1 person PT Short Term Goal 5 - Progress (Week 2): Discontinued (comment) (pt no longer anticipated to be at a functional standing/ambulation level at discharge due to decreased  strength and functional endurance)  Skilled Therapeutic Interventions/Progress Updates:  1:1. Pt received sitting in room in w/c with wife and daughter at bedside. Session focused on functional transfers, positioning and pt/family education. Noted pt now has air bed in room. Pt was slide board t/f w/c>bed with maxA x1 person +2 present for safety. Pt needed verbal cues for hand placement and anterior weight shift over feet. Pt unable to maintain upright posture for extended periods of time with use of B UE, demonstrating progressive R anterior lean req close(Mitchell)-mid A and cueing for improved posture. Pt required modA x2 persons for EOB>supine with verbal cues. Pt required mod A x1 for B rolling in  supine w/ railings and mod verbal cues for technique and hand placement. O2 sats hovering around 90% thoughout session with pt on 3L of humidified O2; HR elevated and hovering around 120 seated at EOB.  Due to noted fatigue, pt unable to further participate in physical therapy therapy missing 20min at end of session. Due to pts decreased postural control in w/c throughout day, inability to independently correct posture or perform pressure reliefs and noted increased skin breakdown on pt buttocks per nursing, tilt-in-space w/c with Roy Mitchell Fusion cushion was placed in room for trial tomorrow with therapy. Pt and family educated on DME changes to address functional endurance and skin integrity.   Pt left supine in bed with all needs within reach and RT present performing breathing treatment.  Therapy Documentation Precautions:  Precautions Precautions: Fall Restrictions Weight Bearing Restrictions: No General: PT Amount of Missed Time (min): 20 Minutes PT Missed Treatment Reason: Patient fatigue;Patient ill (Comment) (pt appeared very fatigued, SOB, seated resting HR hovering 120'Mitchell, sats hovering 90%) Vital Signs: Therapy Vitals Pulse Rate: (!) 120 Patient Position (if appropriate): Sitting Oxygen Therapy SpO2: 90 % O2 Device: Nasal Cannula O2 Flow Rate (L/min): 3 L/min  See FIM for current functional status  Therapy/Group: Individual Therapy  Roy Mitchell, Roy Mitchell 08/13/2014, 5:50 PM

## 2014-08-13 NOTE — Progress Notes (Signed)
Occupational Therapy Session Note  Patient Details  Name: Roy Mitchell MRN: [id number] Date of Birth: Nov 18, 1923  Today's Date: 08/13/2014 OT Individual Time: 0700-0800 OT Individual Time Calculation (min): 60 min    Short Term Goals: Week 3:  OT Short Term Goal 1 (Week 3): Pt will perform LB dressing tasks with max A OT Short Term Goal 2 (Week 3): Pt will perform toileting tasks with max A OT Short Term Goal 3 (Week 3): Pt will perform toilet transfers with max A OT Short Term Goal 4 (Week 3): Pt will perform sit<>stand at sink with mod A in preparation for pulling up pants  Skilled Therapeutic Interventions/Progress Updates:    Pt engaged in BADL retraining including bating and dressing with sit<>stand using Stedy.  Pt initially attempted sit<>stand with RW but required tot A + 2 and Stedy used for subsequent sit<>stand.  Pt required max A for sit<>stand with Stedy.  Pt was able to maintain standing with max A for approx 15 seconds. Pt fatigued quickly and activilty tolerance declined as session progressed.  Pt noted with SOB after each task segment and required multiple rest breaks.  Pt unable to perform pursed lip breathing.  Pt was able to bathe UB this morning and thread BUE into shirt sleeves.  Focus on activity tolerance, sit<>stand, sitting balance, BADL retraining, and safety awareness.  Therapy Documentation Precautions:  Precautions Precautions: Fall Restrictions Weight Bearing Restrictions: No Pain: Pain Assessment Pain Assessment: No/denies pain  See FIM for current functional status  Therapy/Group: Individual Therapy  [name], Dorrian Doggett Chappell 08/13/2014, 8:00 AM

## 2014-08-13 NOTE — Progress Notes (Signed)
Physical Therapy Weekly Progress Note  Patient Details  Name: Roy Mitchell MRN: [id number] Date of Birth: 1923/10/23  Beginning of progress report period: August 06, 2014 End of progress report period: August 13, 2014  Today's Date: 08/13/2014  Patient has met 0 of 5 short term goals due to continued functional deficits including decreased muscular strength, decreased functional endurance, decreased sitting balance, decreased postural control, impaired cardiovascular response to activity, decreased memory and cognitive performance, decreased overall functional transfers and mobility. Pt currently requires S-minA for w/c propulsion, sitting balance. ModA for bed mobility with consistent use of hospital bed functions. Pt requiring overall maxA-totalA x2 people and consistent verbal/tactile cues for functional transfers. However, pt making slow and inconsistent progress toward 4/5 STG at sitting and bed level, see details below. This week, pt with good tolerance and response to skilled chest PT performed by PT with increased movement and expulsion of secretions, positive vitals response to therapy and pt reports of decreased SOB and congestion.  Pt will continue to benefit from skilled PT intervention to enhance overall performance with activity tolerance, balance, postural control, ability to compensate for deficits, awareness and overall functional transfers and mobility..  Patient not progressing toward long term goals.  See goal revision for overall downgraded goals.  Plan of care revisions: discharge ambulation goals due to decreased ability to tolerate functional standing activity due to decreased muscular strength of B LE, decreased endurance and inconsistent cardiovascular response to activity. All other LTG downgraded due to decreased strength and endurance as well as anticipated level of physical assist upon d/c from hospital.  PT Short Term Goals Week 2:  PT Short Term Goal 1 (Week 2):  pt will maintain dynamic sitting balance with (S) PT Short Term Goal 1 - Progress (Week 2): Progressing toward goal PT Short Term Goal 2 (Week 2): pt will perform bed mobility in hospital bed with minA 50% of the time and minimal cuing for sequencing PT Short Term Goal 2 - Progress (Week 2): Progressing toward goal PT Short Term Goal 3 (Week 2): pt will perform bed<>chair transfers with minA 50% of the time and min cuing for sequencing PT Short Term Goal 3 - Progress (Week 2): Progressing toward goal PT Short Term Goal 4 (Week 2): pt will perform sit<>stand from non-elevated surface with assistance of modAx1 PT Short Term Goal 4 - Progress (Week 2): Progressing toward goal PT Short Term Goal 5 (Week 2): pt will maintain standing for >30sec with assist of 1 person PT Short Term Goal 5 - Progress (Week 2): Discontinued (comment) (pt no longer anticipated to be at a functional standing/ambulation level at discharge due to decreased  strength and functional endurance) Week 3:  PT Short Term Goal 1 (Week 3): pt will maintain dynamic sitting balance with min guard PT Short Term Goal 2 (Week 3): pt will perform bed mobility in hospital bed with minA 50% of the time and minimal cuing for sequencing PT Short Term Goal 3 (Week 3): pt will perform bed<>chair transfers with modA and sliding board 50% of the time and min cuing for sequencing PT Short Term Goal 4 (Week 3): pt will perform sit<>stand from non-elevated surface with assistance of modAx1 with RW, 50% of the time PT Short Term Goal 5 (Week 3): pt will demonstrate understanding and be able to direct caregiver in technique for assistance with pressure relief on appropriate intervals with min cues  Therapy Documentation Precautions:  Precautions Precautions: Fall Restrictions Weight Bearing Restrictions: No  See FIM for current functional status  Therapy/Group: Individual Therapy  Carlos Levering 08/13/2014, 5:17 PM

## 2014-08-14 ENCOUNTER — Inpatient Hospital Stay (HOSPITAL_COMMUNITY): Payer: Medicare Other

## 2014-08-14 ENCOUNTER — Inpatient Hospital Stay (HOSPITAL_COMMUNITY): Payer: Medicare Other | Admitting: Speech Pathology

## 2014-08-14 ENCOUNTER — Encounter (HOSPITAL_COMMUNITY): Payer: Medicare Other | Admitting: *Deleted

## 2014-08-14 ENCOUNTER — Inpatient Hospital Stay (HOSPITAL_COMMUNITY)
Admission: AD | Admit: 2014-08-14 | Discharge: 2014-08-17 | DRG: 871 | Disposition: A | Discharge status: Hospice | Payer: Medicare Other | Source: Ambulatory Visit | Attending: Internal Medicine | Admitting: Internal Medicine

## 2014-08-14 DIAGNOSIS — J690 Pneumonitis due to inhalation of food and vomit: Secondary | ICD-10-CM | POA: Diagnosis present

## 2014-08-14 DIAGNOSIS — J449 Chronic obstructive pulmonary disease, unspecified: Secondary | ICD-10-CM | POA: Diagnosis present

## 2014-08-14 DIAGNOSIS — R627 Adult failure to thrive: Secondary | ICD-10-CM | POA: Diagnosis present

## 2014-08-14 DIAGNOSIS — Z880 Allergy status to penicillin: Secondary | ICD-10-CM

## 2014-08-14 DIAGNOSIS — I10 Essential (primary) hypertension: Secondary | ICD-10-CM | POA: Diagnosis present

## 2014-08-14 DIAGNOSIS — Z888 Allergy status to other drugs, medicaments and biological substances status: Secondary | ICD-10-CM | POA: Diagnosis not present

## 2014-08-14 DIAGNOSIS — R05 Cough: Secondary | ICD-10-CM | POA: Insufficient documentation

## 2014-08-14 DIAGNOSIS — Z66 Do not resuscitate: Secondary | ICD-10-CM | POA: Diagnosis present

## 2014-08-14 DIAGNOSIS — R0602 Shortness of breath: Secondary | ICD-10-CM | POA: Insufficient documentation

## 2014-08-14 DIAGNOSIS — D638 Anemia in other chronic diseases classified elsewhere: Secondary | ICD-10-CM | POA: Diagnosis present

## 2014-08-14 DIAGNOSIS — S065X4S Traumatic subdural hemorrhage with loss of consciousness of 6 hours to 24 hours, sequela: Secondary | ICD-10-CM | POA: Diagnosis not present

## 2014-08-14 DIAGNOSIS — I739 Peripheral vascular disease, unspecified: Secondary | ICD-10-CM | POA: Diagnosis present

## 2014-08-14 DIAGNOSIS — I248 Other forms of acute ischemic heart disease: Secondary | ICD-10-CM | POA: Diagnosis present

## 2014-08-14 DIAGNOSIS — E042 Nontoxic multinodular goiter: Secondary | ICD-10-CM | POA: Diagnosis present

## 2014-08-14 DIAGNOSIS — Z952 Presence of prosthetic heart valve: Secondary | ICD-10-CM | POA: Diagnosis not present

## 2014-08-14 DIAGNOSIS — A419 Sepsis, unspecified organism: Secondary | ICD-10-CM | POA: Diagnosis present

## 2014-08-14 DIAGNOSIS — S065X9A Traumatic subdural hemorrhage with loss of consciousness of unspecified duration, initial encounter: Secondary | ICD-10-CM | POA: Diagnosis present

## 2014-08-14 DIAGNOSIS — S065XAA Traumatic subdural hemorrhage with loss of consciousness status unknown, initial encounter: Secondary | ICD-10-CM

## 2014-08-14 DIAGNOSIS — E876 Hypokalemia: Secondary | ICD-10-CM | POA: Diagnosis present

## 2014-08-14 DIAGNOSIS — F418 Other specified anxiety disorders: Secondary | ICD-10-CM

## 2014-08-14 DIAGNOSIS — F329 Major depressive disorder, single episode, unspecified: Secondary | ICD-10-CM | POA: Diagnosis present

## 2014-08-14 DIAGNOSIS — I2510 Atherosclerotic heart disease of native coronary artery without angina pectoris: Secondary | ICD-10-CM | POA: Diagnosis present

## 2014-08-14 DIAGNOSIS — R1314 Dysphagia, pharyngoesophageal phase: Secondary | ICD-10-CM | POA: Diagnosis present

## 2014-08-14 DIAGNOSIS — Z515 Encounter for palliative care: Secondary | ICD-10-CM

## 2014-08-14 DIAGNOSIS — Z87891 Personal history of nicotine dependence: Secondary | ICD-10-CM | POA: Diagnosis not present

## 2014-08-14 DIAGNOSIS — R739 Hyperglycemia, unspecified: Secondary | ICD-10-CM | POA: Diagnosis present

## 2014-08-14 DIAGNOSIS — J9601 Acute respiratory failure with hypoxia: Secondary | ICD-10-CM

## 2014-08-14 DIAGNOSIS — F419 Anxiety disorder, unspecified: Secondary | ICD-10-CM | POA: Diagnosis present

## 2014-08-14 DIAGNOSIS — R059 Cough, unspecified: Secondary | ICD-10-CM | POA: Insufficient documentation

## 2014-08-14 DIAGNOSIS — R7989 Other specified abnormal findings of blood chemistry: Secondary | ICD-10-CM | POA: Diagnosis present

## 2014-08-14 DIAGNOSIS — R778 Other specified abnormalities of plasma proteins: Secondary | ICD-10-CM | POA: Diagnosis present

## 2014-08-14 LAB — BASIC METABOLIC PANEL
ANION GAP: 8 (ref 5–15)
BUN: 27 mg/dL — AB (ref 6–23)
CALCIUM: 9 mg/dL (ref 8.4–10.5)
CHLORIDE: 103 mmol/L (ref 96–112)
CO2: 32 mmol/L (ref 19–32)
Creatinine, Ser: 0.63 mg/dL (ref 0.50–1.35)
GFR calc Af Amer: >90 mL/min (ref >90)
GFR, EST NON AFRICAN AMERICAN: 85 mL/min — AB (ref >90)
Glucose, Bld: 117 mg/dL — ABNORMAL HIGH (ref 70–99)
Potassium: 3.2 mmol/L — ABNORMAL LOW (ref 3.5–5.1)
Sodium: 143 mmol/L (ref 135–145)

## 2014-08-14 LAB — BLOOD GAS, ARTERIAL
Acid-Base Excess: 5.5 mmol/L — ABNORMAL HIGH (ref 0.0–2.0)
BICARBONATE: 28.8 meq/L — AB (ref 20.0–24.0)
Drawn by: 24686
FIO2: 0.55 %
O2 SAT: 94.2 %
PH ART: 7.503 — AB (ref 7.350–7.450)
Patient temperature: 98.6
TCO2: 30 mmol/L (ref 0–100)
pCO2 arterial: 37.1 mmHg (ref 35.0–45.0)
pO2, Arterial: 65.9 mmHg — ABNORMAL LOW (ref 80.0–100.0)

## 2014-08-14 LAB — GLUCOSE, CAPILLARY
GLUCOSE-CAPILLARY: 123 mg/dL — AB (ref 70–99)
GLUCOSE-CAPILLARY: 111 mg/dL — AB (ref 70–99)
Glucose-Capillary: 121 mg/dL — ABNORMAL HIGH (ref 70–99)
Glucose-Capillary: 135 mg/dL — ABNORMAL HIGH (ref 70–99)

## 2014-08-14 LAB — URINALYSIS, ROUTINE W REFLEX MICROSCOPIC
GLUCOSE, UA: NEGATIVE mg/dL
HGB URINE DIPSTICK: NEGATIVE
KETONES UR: 40 mg/dL — AB
Leukocytes, UA: NEGATIVE
Nitrite: NEGATIVE
PROTEIN: 100 mg/dL — AB
Specific Gravity, Urine: 1.03 (ref 1.005–1.030)
Urobilinogen, UA: 1 mg/dL (ref 0.0–1.0)
pH: 5.5 (ref 5.0–8.0)

## 2014-08-14 LAB — CBC
HEMATOCRIT: 30.6 % — AB (ref 39.0–52.0)
HEMOGLOBIN: 9.7 g/dL — AB (ref 13.0–17.0)
MCH: 26 pg (ref 26.0–34.0)
MCHC: 31.7 g/dL (ref 30.0–36.0)
MCV: 82 fL (ref 78.0–100.0)
Platelets: 251 10*3/uL (ref 150–400)
RBC: 3.73 MIL/uL — ABNORMAL LOW (ref 4.22–5.81)
RDW: 16.9 % — AB (ref 11.5–15.5)
WBC: 15.5 10*3/uL — ABNORMAL HIGH (ref 4.0–10.5)

## 2014-08-14 LAB — URINE MICROSCOPIC-ADD ON

## 2014-08-14 LAB — MRSA PCR SCREENING: MRSA by PCR: POSITIVE — AB

## 2014-08-14 LAB — TROPONIN I
TROPONIN I: 0.06 ng/mL — AB (ref <0.031)
TROPONIN I: 0.39 ng/mL — AB (ref <0.031)

## 2014-08-14 MED ORDER — PREDNISONE 5 MG PO TABS: 5.0000 mg | ORAL_TABLET | Freq: Every day | ORAL | Status: DC

## 2014-08-14 MED ORDER — LEVALBUTEROL HCL 0.63 MG/3ML IN NEBU
0.6300 mg | INHALATION_SOLUTION | Freq: Four times a day (QID) | RESPIRATORY_TRACT | Status: DC | PRN
  Administered 2014-08-14: 0.63 mg via RESPIRATORY_TRACT

## 2014-08-14 MED ORDER — ACETAMINOPHEN 325 MG PO TABS: 650.0000 mg | ORAL_TABLET | Freq: Four times a day (QID) | ORAL | Status: DC | PRN

## 2014-08-14 MED ORDER — MODAFINIL 100 MG PO TABS: 100.0000 mg | ORAL_TABLET | Freq: Every day | ORAL | Status: DC

## 2014-08-14 MED ORDER — METHYLPREDNISOLONE SODIUM SUCC 40 MG IJ SOLR
40.0000 mg | Freq: Two times a day (BID) | INTRAMUSCULAR | Status: DC
  Administered 2014-08-14 – 2014-08-16 (×4): 40 mg via INTRAVENOUS
  Filled 2014-08-14 (×6): qty 1

## 2014-08-14 MED ORDER — KETOTIFEN FUMARATE 0.025 % OP SOLN
1.0000 [drp] | Freq: Two times a day (BID) | OPHTHALMIC | Status: DC
  Administered 2014-08-14 – 2014-08-17 (×5): 1 [drp] via OPHTHALMIC
  Filled 2014-08-14: qty 5

## 2014-08-14 MED ORDER — MORPHINE SULFATE 2 MG/ML IJ SOLN
1.0000 mg | INTRAMUSCULAR | Status: DC | PRN
  Administered 2014-08-14 – 2014-08-16 (×5): 1 mg via INTRAVENOUS
  Filled 2014-08-14 (×6): qty 1

## 2014-08-14 MED ORDER — CLINDAMYCIN PHOSPHATE 600 MG/50ML IV SOLN: 600.0000 mg | Freq: Three times a day (TID) | INTRAVENOUS | Status: DC

## 2014-08-14 MED ORDER — INSULIN ASPART 100 UNIT/ML ~~LOC~~ SOLN
0.0000 [IU] | SUBCUTANEOUS | Status: DC
  Administered 2014-08-14 – 2014-08-16 (×9): 1 [IU] via SUBCUTANEOUS

## 2014-08-14 MED ORDER — LEVOFLOXACIN IN D5W 500 MG/100ML IV SOLN: 500.0000 mg | INTRAVENOUS | Status: DC

## 2014-08-14 MED ORDER — FINASTERIDE 5 MG PO TABS: 5.0000 mg | ORAL_TABLET | Freq: Every day | ORAL | Status: DC

## 2014-08-14 MED ORDER — SODIUM CHLORIDE 0.9 % IV SOLN
500.0000 mg | Freq: Three times a day (TID) | INTRAVENOUS | Status: DC
  Administered 2014-08-14 – 2014-08-16 (×6): 500 mg via INTRAVENOUS
  Filled 2014-08-14 (×8): qty 500

## 2014-08-14 MED ORDER — LORAZEPAM 2 MG/ML IJ SOLN: 0.5000 mg | Freq: Four times a day (QID) | INTRAMUSCULAR | Status: DC | PRN

## 2014-08-14 MED ORDER — POTASSIUM CHLORIDE CRYS ER 20 MEQ PO TBCR: 20.0000 meq | EXTENDED_RELEASE_TABLET | Freq: Two times a day (BID) | ORAL | Status: DC

## 2014-08-14 MED ORDER — OMEGA-3-ACID ETHYL ESTERS 1 G PO CAPS: 1.0000 g | ORAL_CAPSULE | Freq: Two times a day (BID) | ORAL | Status: DC

## 2014-08-14 MED ORDER — LEVETIRACETAM 500 MG/5ML IV SOLN
250.0000 mg | Freq: Two times a day (BID) | INTRAVENOUS | Status: DC
  Administered 2014-08-14 – 2014-08-16 (×4): 250 mg via INTRAVENOUS
  Filled 2014-08-14 (×5): qty 2.5

## 2014-08-14 MED ORDER — ACETYLCYSTEINE 20 % IN SOLN
2.0000 mL | Freq: Three times a day (TID) | RESPIRATORY_TRACT | Status: DC
  Filled 2014-08-14 (×3): qty 4

## 2014-08-14 MED ORDER — LEVOFLOXACIN IN D5W 500 MG/100ML IV SOLN
500.0000 mg | INTRAVENOUS | Status: DC
  Administered 2014-08-14: 500 mg via INTRAVENOUS
  Filled 2014-08-14: qty 100

## 2014-08-14 MED ORDER — SODIUM CHLORIDE 0.9 % IJ SOLN
3.0000 mL | Freq: Two times a day (BID) | INTRAMUSCULAR | Status: DC
  Administered 2014-08-14 – 2014-08-17 (×6): 3 mL via INTRAVENOUS

## 2014-08-14 MED ORDER — BIOTENE DRY MOUTH MT LIQD
15.0000 mL | Freq: Three times a day (TID) | OROMUCOSAL | Status: DC
Start: 2014-08-14 — End: 2014-08-14

## 2014-08-14 MED ORDER — METFORMIN HCL 500 MG PO TABS: 500.0000 mg | ORAL_TABLET | Freq: Two times a day (BID) | ORAL | Status: DC

## 2014-08-14 MED ORDER — POTASSIUM CHLORIDE 2 MEQ/ML IV SOLN
INTRAVENOUS | Status: DC
  Administered 2014-08-14 – 2014-08-16 (×3): via INTRAVENOUS
  Filled 2014-08-14 (×5): qty 1000

## 2014-08-14 MED ORDER — ACETAMINOPHEN 650 MG RE SUPP: 650.0000 mg | Freq: Four times a day (QID) | RECTAL | Status: DC | PRN

## 2014-08-14 MED ORDER — SODIUM CHLORIDE 0.9 % IJ SOLN: 3.0000 mL | Freq: Two times a day (BID) | INTRAMUSCULAR | Status: DC

## 2014-08-14 MED ORDER — SODIUM CHLORIDE 0.45 % IV SOLN
INTRAVENOUS | Status: DC
  Administered 2014-08-14: 10:00:00 via INTRAVENOUS

## 2014-08-14 MED ORDER — SCOPOLAMINE 1 MG/3DAYS TD PT72
1.0000 | MEDICATED_PATCH | TRANSDERMAL | Status: DC
  Administered 2014-08-14: 1.5 mg via TRANSDERMAL
  Filled 2014-08-14: qty 1

## 2014-08-14 MED ORDER — BUDESONIDE 0.25 MG/2ML IN SUSP
0.2500 mg | Freq: Two times a day (BID) | RESPIRATORY_TRACT | Status: DC
  Administered 2014-08-14 – 2014-08-17 (×6): 0.25 mg via RESPIRATORY_TRACT
  Filled 2014-08-14 (×8): qty 2

## 2014-08-14 MED ORDER — SODIUM CHLORIDE 0.9 % IV SOLN
500.0000 mg | Freq: Three times a day (TID) | INTRAVENOUS | Status: DC
  Filled 2014-08-14 (×2): qty 500

## 2014-08-14 MED ORDER — LEVALBUTEROL HCL 0.63 MG/3ML IN NEBU
0.6300 mg | INHALATION_SOLUTION | Freq: Four times a day (QID) | RESPIRATORY_TRACT | Status: DC
  Administered 2014-08-14 – 2014-08-16 (×8): 0.63 mg via RESPIRATORY_TRACT
  Filled 2014-08-14 (×15): qty 3

## 2014-08-14 MED ORDER — METOPROLOL TARTRATE 1 MG/ML IV SOLN
2.5000 mg | Freq: Four times a day (QID) | INTRAVENOUS | Status: DC
  Administered 2014-08-14 – 2014-08-16 (×8): 2.5 mg via INTRAVENOUS
  Filled 2014-08-14 (×12): qty 5

## 2014-08-14 MED ORDER — ONDANSETRON HCL 4 MG PO TABS: 4.0000 mg | ORAL_TABLET | Freq: Four times a day (QID) | ORAL | Status: DC | PRN

## 2014-08-14 MED ORDER — VANCOMYCIN HCL IN DEXTROSE 750-5 MG/150ML-% IV SOLN
750.0000 mg | Freq: Two times a day (BID) | INTRAVENOUS | Status: DC
  Filled 2014-08-14: qty 150

## 2014-08-14 MED ORDER — ONDANSETRON HCL 4 MG/2ML IJ SOLN: 4.0000 mg | Freq: Four times a day (QID) | INTRAMUSCULAR | Status: DC | PRN

## 2014-08-14 MED ORDER — METOPROLOL TARTRATE 25 MG/10 ML ORAL SUSPENSION: 25.0000 mg | Freq: Two times a day (BID) | ORAL | Status: DC

## 2014-08-14 MED ORDER — POTASSIUM CHLORIDE CRYS ER 20 MEQ PO TBCR
20.0000 meq | EXTENDED_RELEASE_TABLET | Freq: Two times a day (BID) | ORAL | Status: DC
  Filled 2014-08-14 (×3): qty 1

## 2014-08-14 MED ORDER — SODIUM CHLORIDE 0.9 % IV SOLN: INTRAVENOUS | Status: DC

## 2014-08-14 MED ORDER — ACETYLCYSTEINE 10% NICU INHALATION SOLUTION: 2.0000 mL | Freq: Three times a day (TID) | RESPIRATORY_TRACT | Status: DC

## 2014-08-14 MED ORDER — VANCOMYCIN HCL 10 G IV SOLR
1500.0000 mg | Freq: Once | INTRAVENOUS | Status: AC
  Administered 2014-08-14: 1500 mg via INTRAVENOUS
  Filled 2014-08-14: qty 1500

## 2014-08-14 MED ORDER — BUDESONIDE 0.5 MG/2ML IN SUSP: 0.5000 mg | Freq: Two times a day (BID) | RESPIRATORY_TRACT | Status: DC

## 2014-08-14 MED ORDER — INSULIN ASPART 100 UNIT/ML ~~LOC~~ SOLN: 0.0000 [IU] | SUBCUTANEOUS | Status: DC

## 2014-08-14 MED ORDER — KETOTIFEN FUMARATE 0.025 % OP SOLN: 1.0000 [drp] | Freq: Two times a day (BID) | OPHTHALMIC | Status: DC

## 2014-08-14 MED ORDER — MIRTAZAPINE 15 MG PO TBDP: 15.0000 mg | ORAL_TABLET | Freq: Every day | ORAL | Status: DC

## 2014-08-14 NOTE — Progress Notes (Signed)
Occupational Therapy Session Note  Patient Details  Name: Roy Mitchell MRN: [id number] Date of Birth: March 20, 1924  Today's Date: 08/14/2014 OT Individual Time: 0700-0725 OT Individual Time Calculation (min): 25 min  and Today's Date: 08/14/2014 OT Missed Time: 35 Minutes Missed Time Reason: X-Ray   Short Term Goals: Week 3:  OT Short Term Goal 1 (Week 3): Pt will perform LB dressing tasks with max A OT Short Term Goal 2 (Week 3): Pt will perform toileting tasks with max A OT Short Term Goal 3 (Week 3): Pt will perform toilet transfers with max A OT Short Term Goal 4 (Week 3): Pt will perform sit<>stand at sink with mod A in preparation for pulling up pants  Skilled Therapeutic Interventions/Progress Updates:    Pt resting in bed with MD and RN present.  MD stated to complete therapies at bed level this morning.  BADLs initiated at bed level and incontinence brief changed before portable X-Ray arrived and session terminated.  Pt required mod A for rolling to left and max A for rolling to right to facilitate changing of brief and performing pericare.  Pt repositioned in bed prior to X-Ray entering.   Therapy Documentation Precautions:  Precautions Precautions: Fall Restrictions Weight Bearing Restrictions: No General: General OT Amount of Missed Time: 35 Minutes Pain:  No c/o pain  See FIM for current functional status  Therapy/Group: Individual Therapy  [name], Katiana Ruland Chappell 08/14/2014, 7:43 AM

## 2014-08-14 NOTE — Progress Notes (Signed)
Please see admission H&P for detail. atient seen and examined independently. Admission H&P, exam , assessement and plan discussed with APP. Acute hypoxic respiratory failure with sepsis related to aspiration pneumonia. Patient has severe deconditioning. Continue Ventimask monitoring stepdown for the next 24 hours. As outlined below discussed at length with wife and daughter Larita FifeLynn about goals of care and the understand that patient has progressive decline in symptoms and has poor prognosis. They agree with antibiotics , keeping NPO and IVF for the next 24 hours and reevaluate for any improvement in symptoms. If no significant improvement or further deterioration daily with goal for comfort care. Appreciate PCCM evaluation and recommendations. Rest of the plan as outlined in H&P.

## 2014-08-14 NOTE — Progress Notes (Signed)
[facility] PHYSICAL MEDICINE & REHABILITATION     PROGRESS NOTE    Subjective/Complaints: Increased respiratory distress overnight with more secretions per RN and sats into 80's--on NRB mask upon entry A  review of systems has been performed and if not noted above is otherwise negative. Patient Insight is reduced however   Objective: Vital Signs: Blood pressure 124/75, pulse 116, temperature 97.7 F (36.5 C), temperature source Oral, resp. rate 20, height  (1.676 m), weight 79 kg (174 lb 2.6 oz), SpO2 96 %. Dg Chest 2 View  08/13/2014   CLINICAL DATA:  Shortness of breath, weakness, cough, GERD, hypertension  EXAM: CHEST  2 VIEW  COMPARISON:  08/07/2014  FINDINGS: Enlargement of cardiac silhouette post AVR.  Atherosclerotic calcification aorta.  Mediastinal contours and pulmonary vascularity otherwise normal.  Increased bibasilar opacities question atelectasis versus infiltrate.  Upper lungs clear.  Tiny BILATERAL pleural effusions.  No pneumothorax.  Bones demineralized.  IMPRESSION: Increased bibasilar atelectasis versus infiltrate.   Electronically Signed   By: Ulyses Southward M.D.   On: 08/13/2014 09:10   Dg Chest Port 1 View  08/14/2014   CLINICAL DATA:  Shortness of breath  EXAM: PORTABLE CHEST - 1 VIEW  COMPARISON:  08/13/2014  FINDINGS: Cardiac shadow is stable. There again noted changes consistent with aortic valve repair. Lungs again demonstrate patchy infiltrative change worse on the right than left. This is increased slightly in the interval from the prior exam particularly on the right. No pneumothorax is seen. No bony abnormality is noted.  IMPRESSION: Bibasilar changes right greater than left which have increased slightly in the interval from the prior exam.   Electronically Signed   By: Alcide Clever M.D.   On: 08/14/2014 07:32   No results for input(s): WBC, HGB, HCT, PLT in the last 72 hours. No results for input(s): NA, K, CL, GLUCOSE, BUN, CREATININE, CALCIUM in the last 72  hours.  Invalid input(s): CO CBG (last 3)   Recent Labs  08/13/14 1650 08/13/14 2206 08/14/14 0656  GLUCAP 135* 130* 123*    Wt Readings from Last 3 Encounters:  08/12/14 79 kg (174 lb 2.6 oz)  07/08/14 84.3 kg (185 lb 13.6 oz)  04/30/14 89.585 kg (197 lb 8 oz)    Physical Exam:  Constitutional: He is oriented to person, place, and time. He appears well-developed and well-nourished.  HENT: mucosa still pretty dry Head: Normocephalic and atraumatic.  Well healed left crani incision.  Eyes: Conjunctivae are normal. Pupils are equal, round, and reactive to light.  Neck: Normal range of motion. Neck supple.  Cardiovascular: Normal rate and regular rhythm.  No murmur heard. Respiratory: diffuse rhonchi and upper airway sounds. crackles bibasalarly right more than left. In  Mild distress. Still able to speak and carry  GI: Soft. Bowel sounds are normal. He exhibits no distension. There is no tenderness.  Musculoskeletal: He exhibits no edema or tenderness.  Neurological: He is alert and oriented to person, place, and time.  Continued dysarthria and dysphonia with expressive deficits. Decreased oro-motor control---but better.  apraxia noted with MMT. UE grossly 4/5 prox to distal. LE: 3- hf, 3 ke and 4/5 ankle dorsi and plantarflexion. Sensory exam grossly intact Skin: Skin is warm and dry.    Psych: pt pleasant and generally appropriate  Assessment/Plan: 1. Functional deficits secondary to left SDH complicated by deconditioning from multiple medical issues which require 3+ hours per day of interdisciplinary therapy in a comprehensive inpatient rehab setting. Physiatrist is providing close  team supervision and 24 hour management of active medical problems listed below. Physiatrist and rehab team continue to assess barriers to discharge/monitor patient progress toward functional and medical goals.  Hold therapies for now due to respiratory issues.  FIM: FIM -  Bathing Bathing Steps Patient Completed: Chest, Right Arm, Left Arm, Abdomen Bathing: 2: Max-Patient completes 3-4 8240f 10 parts or 25-49%  FIM - Upper Body Dressing/Undressing Upper body dressing/undressing steps patient completed: Thread/unthread right sleeve of pullover shirt/dresss, Thread/unthread left sleeve of pullover shirt/dress Upper body dressing/undressing: 3: Mod-Patient completed 50-74% of tasks FIM - Lower Body Dressing/Undressing Lower body dressing/undressing: 1: Two helpers  FIM - Toileting Toileting steps completed by patient: Performs perineal hygiene Toileting: 1: Two helpers  FIM - Diplomatic Services operational officerToilet Transfers Toilet Transfers Assistive Devices: Therapist, musicGrab bars Toilet Transfers: 1-Two helpers  FIM - BankerBed/Chair Transfer Bed/Chair Transfer Assistive Devices: Bed rails, Sliding board, Arm rests Bed/Chair Transfer: 1: Two helpers  FIM - Locomotion: Wheelchair Distance: 100 Locomotion: Wheelchair: 0: Activity did not occur FIM - Locomotion: Ambulation Locomotion: Ambulation Assistive Devices: Designer, industrial/productWalker - Rolling Ambulation/Gait Assistance: 2: Max assist Locomotion: Ambulation: 0: Activity did not occur  Comprehension Comprehension Mode: Auditory Comprehension: 3-Understands basic 50 - 74% of the time/requires cueing 25 - 50%  of the time  Expression Expression Mode: Verbal Expression: 3-Expresses basic 50 - 74% of the time/requires cueing 25 - 50% of the time. Needs to repeat parts of sentences.  Social Interaction Social Interaction: 6-Interacts appropriately with others with medication or extra time (anti-anxiety, antidepressant).  Problem Solving Problem Solving Mode: Not assessed Problem Solving: 3-Solves basic 50 - 74% of the time/requires cueing 25 - 49% of the time  Memory Memory: 3-Recognizes or recalls 50 - 74% of the time/requires cueing 25 - 49% of the time Medical Problem List and Plan: 1. Functional deficits secondary to Left SDH complicated by acute respiratory  failure 2. DVT Prophylaxis/Anticoagulation: Mechanical: Sequential compression devices, below knee Bilateral lower extremities 3. Pain Management: tylenol prn 4. Mood: Continue remeron at bedtime. LCSW to follow for evaluation and support.  5. Neuropsych: This patient is not capable of making decisions on his own behalf. 6. Skin/Wound Care: Routine pressure relief measures.  7. Fluids/Electrolytes/Nutrition:  .    - Continue pro stat supplement tid. Offer ensure pudding or magic cup between meals.  -PO seems to be picking up  -BMET within normal limits most recently  -continue low dose megace to spark appetite 8. Hematuria: Has h/o BPH--sees Dr. Saddie Bendershao in [place]. Continue flomax and finasteride. Monitor voiding with PVR check. Monitor H/H.   -HGB trending up 9. Reactive airway disease: Was on nocturnal oxygen with nebs bid PTA.   -follow up cxr stable to improved  -decreased prednisone to  5 mg daily   -IS, coughing, OOB, biotene for oral mucosa--humidified Oxygen 10. HTN:   Continue metoprolol bid.  11. Seizure prophylaxis: Will continue Keppra bid 12. Acute respirato Continue budesonide nebs bid. Encourage IS every 4 hours while awake.   -cxr appears to demonstrate a RLL infiltrate this morning---?aspiration  -begin empiric zosyn  -labs, abg pending.  -will ask CCM to evaluate patient also  -increase mucomyst as well which he's done well with   -oxygen, supportive care as indicated LOS (Days) 17 A FACE TO FACE EVALUATION WAS PERFORMED  Treacy Holcomb T 08/14/2014 7:52 AM

## 2014-08-14 NOTE — Progress Notes (Signed)
Called respiratory to see patient. O2 saturation staying at 84% after reading 93% per NT for morning vitals; Patient had been changed and repositioned. Jessica, RRT, gave prn breathing treatment. Attempted to keep patient on [place] 6L, O2 saturation reading 90%.  Venturi mask now at bedside in use, 55%; O2 saturation 93%. Will continue to monitor.

## 2014-08-14 NOTE — Discharge Summary (Signed)
Physician Discharge Summary  Patient ID: Roy Mitchell MRN: [id number] DOB/AGE: 03-26-1924 81 y.o.  Admit date: 07/28/2014 Discharge date: 08/14/2014  Discharge Diagnoses:  Principal Problem:   Acute respiratory failure with hypoxia Active Problems:   Traumatic subdural hematoma with loss of consciousness of 6 hours to 24 hours   Anxiety and depression   Mild reactive airways disease   Dysphagia, pharyngoesophageal phase   Aspiration pneumonia   Discharged Condition: Guarded  Significant Diagnostic Studies: Dg Chest 2 View  08/13/2014   CLINICAL DATA:  Shortness of breath, weakness, cough, GERD, hypertension  EXAM: CHEST  2 VIEW  COMPARISON:  08/07/2014  FINDINGS: Enlargement of cardiac silhouette post AVR.  Atherosclerotic calcification aorta.  Mediastinal contours and pulmonary vascularity otherwise normal.  Increased bibasilar opacities question atelectasis versus infiltrate.  Upper lungs clear.  Tiny BILATERAL pleural effusions.  No pneumothorax.  Bones demineralized.  IMPRESSION: Increased bibasilar atelectasis versus infiltrate.   Electronically Signed   By: Ulyses SouthwardMark  Boles M.D.   On: 08/13/2014 09:10   Dg Chest 2 View  08/07/2014   CLINICAL DATA:  Fall, subdural hematoma, cough, history hypertension, hypercholesterolemia, former smoker  EXAM: CHEST  2 VIEW  COMPARISON:  07/30/2014  FINDINGS: Borderline enlargement of cardiac silhouette post aortic valve replacement.  Calcified thoracic aorta.  Mediastinal contours and pulmonary vascularity otherwise normal.  Emphysematous changes and bronchitic changes consistent with COPD.  Bibasilar atelectasis.  Lungs otherwise clear.  No pleural effusion or pneumothorax.  Bones demineralized.  IMPRESSION: COPD changes with bibasilar atelectasis.   Electronically Signed   By: Ulyses SouthwardMark  Boles M.D.   On: 08/07/2014 10:40   Dg Chest 2 View  07/30/2014   CLINICAL DATA:  Subsequent evaluation for cough and shortness of breath for a few days  EXAM: CHEST  2  VIEW  COMPARISON:  07/28/2014  FINDINGS: Stable cardiac enlargement and aortic arch calcification as well as mesh graft proximal aorta. Vascular pattern normal with no evidence of edema infiltrate or effusion. Mild bibasilar opacities again appear most consistent with scarring or atelectasis.  IMPRESSION: Stable bibasilar opacities.   Electronically Signed   By: Esperanza Heiraymond  Rubner M.D.   On: 07/30/2014 13:51   Dg Chest 2 View  07/29/2014   CLINICAL DATA:  Aspiration pneumonia, wheezing  EXAM: CHEST  2 VIEW  COMPARISON:  Portable chest x-ray 07/08/2014  FINDINGS: There is parenchymal opacity remaining at the lung bases some of which may represent atelectasis, but residual pneumonia cannot be excluded. No effusion is seen. Heart size is stable.  IMPRESSION: Persistent basilar opacities consistent with atelectasis, but pneumonia cannot be excluded.   Electronically Signed   By: Dwyane DeePaul  Barry M.D.   On: 07/29/2014 08:21   Dg Chest Port 1 View  08/14/2014   CLINICAL DATA:  Shortness of breath  EXAM: PORTABLE CHEST - 1 VIEW  COMPARISON:  08/13/2014  FINDINGS: Cardiac shadow is stable. There again noted changes consistent with aortic valve repair. Lungs again demonstrate patchy infiltrative change worse on the right than left. This is increased slightly in the interval from the prior exam particularly on the right. No pneumothorax is seen. No bony abnormality is noted.  IMPRESSION: Bibasilar changes right greater than left which have increased slightly in the interval from the prior exam.   Electronically Signed   By: Alcide CleverMark  Lukens M.D.   On: 08/14/2014 07:32    Labs:  Basic Metabolic Panel:  Recent Labs Lab 08/14/14 0750  NA 143  K 3.2*  CL 103  CO2 32  GLUCOSE 117*  BUN 27*  CREATININE 0.63  CALCIUM 9.0    CBC:  Recent Labs Lab 08/14/14 0750  WBC 15.5*  HGB 9.7*  HCT 30.6*  MCV 82.0  PLT 251    CBG:  Recent Labs Lab 08/13/14 1130 08/13/14 1650 08/13/14 2206 08/14/14 0656  08/14/14 0812  GLUCAP 166* 135* 130* 123* 121*    Brief HPI:   Roy Mitchell is a 81 y.o. male with history of DDD, HTN, vertigo, tinnitus,TVAR, oxygen dependent at nights, PAD who sustained a fall 2 days PTA on 06/26/14 with new onset of word finding difficulty as well as difficulty using eating utensils. CT head done revealing acute left parietal SDH and conservative care recommended but patient had decline in speech as well as right sided weakness requiring left parietal craniotomy for evacuation of subdural hematoma. Post op course complicated hypoxia and total right lung collapse requiring bronchoscopy 12/15 to help clear mucous plugging. He was treated with IV antibiotics for mucopurulent bronchitis and NGT was placed for nutritional support. He was transferred to [facility] on 12/18 and has had improvement in respiratory status as well as activity levels.  He was started on modified diet but has had poor appetite therefore remeron was added to help with mood as well as appetite. He has been having problems with hematuria and has completed treatment for UTI. Activity levels were improving and he was CIR was recommended by rehab team. Records were reviewed by Rehab MD and rehab coordinator and patient was felt to be a good CIR candidate.   Hospital Course: Roy Mitchell was admitted to rehab 07/28/2014 for inpatient therapies to consist of PT, ST and OT at least three hours five days a week. Past admission physiatrist, therapy team and rehab RN have worked together to provide customized collaborative inpatient rehab. Blood pressures have been stable and he has been afebrile. He was maintained on keppra and has been seizure free during his stay.  Follow up UA was negative for hematuria and H/H had been stable. He was maintained on dysphagia diet with nectar liquids with supervision. Po intake has been poor therefore megace was added to help stimulate appetite and nutritional supplements were added in  between meals.  Lytes have been monitored and renal status has been stable on thickened liquids.    His activity level was slowly improving till decline this week.He has continued to require oxygen at all times. He has had problems mobilizing upper airway secretions and improved with addition of mucomyst. This was discontinued but patient was noted to have increase in wet cough with difficulty mobilizing secretions and SOB with activity on 01/22.  CXR was negative for acute changes and he was treated with 5 day course of Levaquin for bronchitis. Chest PT was added additionally to help mobilize secretions.  In the past 48 hours, he has had increase in fatigue and was noted to be hypoxic this am with desaturation to 84% requiring NRB mask.  CBC this am shows leucocytosis and CXR revealed patchy infiltrates R>LLL suspicious for aspiration PNA. Check of lytes reveal dehydration with hypokalemia therefore IVF were added with kdur for supplement.  CCM was contacted for input and recommended broad spectrum antibiotics for treatment.  Patient was made NPO.  [facility] hospitalist were contacted to admit patient to step down unit for close monitoring.  Dr. Gonzella Lex has graciously accepted patient and he was discharged to acute services.    Rehab course: During patient's stay in  rehab weekly team conferences were held to monitor patient's progress, set goals and discuss barriers to discharge. Patient has shown decline in activity tolerance this week. He is requires moderate assist for transfers with moderate verbal cues. He is able to propel his wheelchair for 100' with moderate assist. He requires moderate to max assist for bathing and UB dressing with increased time and multiple rest breaks. He is requires moderate assist with multimodal phenomic and verbal cues to self monitor and correct paraphasias. He is requires min to moderate assist for problems solving with basic tasks as well as express basic needs.     Disposition:  Stepdown  Unit.  Diet: NPO  Medications at Discharge:   . antiseptic oral rinse  15 mL Mouth Rinse TID  . budesonide (PULMICORT) nebulizer solution  0.5 mg Nebulization BID  . feeding supplement (ENSURE)  1 Container Oral TID BM  . feeding supplement (PRO-STAT SUGAR FREE 64)  30 mL Oral TID WC  . finasteride  5 mg Oral QHS  . imipenem-cilastatin  500 mg Intravenous Q8H  . insulin aspart  0-5 Units Subcutaneous QHS  . insulin aspart  0-9 Units Subcutaneous TID WC  . ketotifen  1 drop Both Eyes BID  . levalbuterol  0.63 mg Nebulization TID PC  . levETIRAcetam  250 mg Oral BID  . megestrol  400 mg Oral Daily  . metFORMIN  500 mg Oral BID WC  . metoprolol tartrate  25 mg Oral BID  . mirtazapine  15 mg Oral QHS  . modafinil  100 mg Oral Daily  . omega-3 acid ethyl esters  1 g Oral BID  . potassium chloride  20 mEq Oral BID  . predniSONE  5 mg Oral Q breakfast  . vancomycin  750 mg Intravenous Q12H    Follow-up Information    Follow up with Ranelle Oyster, MD On 09/16/2014.   Specialty:  Physical Medicine and Rehabilitation   Why:  Be there at 11:30  for noon  appointment    Contact information:   [address], [address] [place] [address] [phone]       Signed: Jacquelynn Cree 08/14/2014, 11:06 AM

## 2014-08-14 NOTE — Consult Note (Signed)
Name: Roy Mitchell MRN: [id number] DOB: Jan 05, 1924    ADMISSION DATE:  07/28/2014 CONSULTATION DATE:  1/29 REFERRING MD :  Riley Kill   CHIEF COMPLAINT:  Acute respiratory failure   BRIEF PATIENT DESCRIPTION:  81 year old male admitted to rehab 1/12 s/p prolonged acute/subacute stay after traumatic SDH requiring evacuation. His acute stay complicated by severe dysphagia, recurrent aspiration and severe deconditioning. PCCM asked to see on 1/29 for recurrent aspiration PNA w/ associated  acute respiratory failure.   SIGNIFICANT EVENTS    STUDIES:     HISTORY OF PRESENT ILLNESS:  81 year old male admitted to [facility] 12/11 for word finding difficulty and difficulty using eating utensils. Larey Seat 12/9 and hit head.) Deficits progressed to difficulty with speech and right-sided weakness, taken to OR 12/11 for evacuation of acute left parietal SDH and admitted to ICU. Extubated 12/13 and later placed on NRB for hypoxia. 12/15 chest x-ray with total right lung collapse and bronchoscopy performed for mucous plugging. Increased oxygen requirements 12/18 requiring venturi mask. Completed course of IV antibiotics (ceftriaxone) for mucopurulent bronchitis and was kept NPO for dysphagia. Transferred to [facility] 12/23 (records unavailable). Admitted to rehab 1/12. Completed course of Levaquin while in rehab. Chest x-ray obtained 1/28 for possible decrease in stamina and for patient sound "wet" x 2-3 days. Oxygen requirements increasing 1/29 am after desaturations to 84%. Patient now on NRB mask. WBC 15.5 today, up from 8.4 1/22.  PCCM asked to see for worsening resp failure  PMH significant for reactive airway disease on nocturnal 02, albuterol, and advair PTA.   PAST MEDICAL HISTORY :   has a past medical history of Hypercholesterolemia; Benign prostatic hypertrophy; GERD (gastroesophageal reflux disease); DDD (degenerative disc disease), cervical; Tinnitus; Multinodular goiter; Prediabetes; DDD (degenerative  disc disease), lumbar; Hypertension; Pulmonary nodules; and Anxiety disorder.  has past surgical history that includes Aortic valve replacement (01/2014); history of blood transfusion; Hernia repair; and Craniotomy (Left, 06/26/2014). Prior to Admission medications   Medication Sig Start Date End Date Taking? Authorizing Provider  ADVAIR DISKUS 250-50 MCG/DOSE AEPB Inhale 1 puff into the lungs 2 (two) times daily.  06/24/14  Yes Historical Provider, MD  clopidogrel (PLAVIX) 75 MG tablet Take 75 mg by mouth daily. 06/24/14  Yes Historical Provider, MD  escitalopram (LEXAPRO) 5 MG tablet Take 5 mg by mouth daily. 06/23/14  Yes Historical Provider, MD  finasteride (PROSCAR) 5 MG tablet Take 5 mg by mouth daily.   Yes Historical Provider, MD  levETIRAcetam 250 mg in sodium chloride 0.9 % 100 mL Inject 250 mg into the vein every 12 (twelve) hours. 07/08/14  Yes William S Minor, NP  LORazepam (ATIVAN) 2 MG tablet Take 2 mg by mouth at bedtime. 06/16/14  Yes Historical Provider, MD  pantoprazole (PROTONIX) 20 MG tablet Take 20 mg by mouth daily. 06/24/14  Yes Historical Provider, MD  tamsulosin (FLOMAX) 0.4 MG CAPS capsule Take 0.4 mg by mouth at bedtime.   Yes Historical Provider, MD   Allergies  Allergen Reactions  . Ace Inhibitors Cough  . Penicillins Rash    FAMILY HISTORY:  family history includes Congestive Heart Failure in his mother. SOCIAL HISTORY:  reports that he has quit smoking. He has never used smokeless tobacco. He reports that he does not drink alcohol or use illicit drugs.  REVIEW OF SYSTEMS:   Unable   SUBJECTIVE:  In some respiratory distress.  VITAL SIGNS: Temp:  [97.1 F (36.2 C)-97.8 F (36.6 C)] 97.7 F (36.5 C) (01/29  1610) Pulse Rate:  [103-121] 116 (01/29 0608) Resp:  [20] 20 (01/28 1428) BP: (90-124)/(52-75) 124/75 mmHg (01/29 0608) SpO2:  [90 %-96 %] 92 % (01/29 0907) FiO2 (%):  [55 %] 55 % (01/29 0907)  PHYSICAL EXAMINATION: General:  Frail 81 year old male,  in acute distress.  Neuro:  Sp slurred, oriented x1, generalized weakness HEENT:  Mucous membranes are dry and caked. Neck veins flat. Loud wet upper airway noises audible  Cardiovascular:  rrr Lungs:  Course scattered rhonchi w/ + accessory muscle use and + tactile frem  Abdomen:  Soft, + bowel sounds  Musculoskeletal:  Generalized weakness  Skin:  Trace dependent edema    Recent Labs Lab 08/14/14 0750  NA 143  K 3.2*  CL 103  CO2 32  BUN 27*  CREATININE 0.63  GLUCOSE 117*    Recent Labs Lab 08/14/14 0750  HGB 9.7*  HCT 30.6*  WBC 15.5*  PLT 251   Dg Chest 2 View  08/13/2014   CLINICAL DATA:  Shortness of breath, weakness, cough, GERD, hypertension  EXAM: CHEST  2 VIEW  COMPARISON:  08/07/2014  FINDINGS: Enlargement of cardiac silhouette post AVR.  Atherosclerotic calcification aorta.  Mediastinal contours and pulmonary vascularity otherwise normal.  Increased bibasilar opacities question atelectasis versus infiltrate.  Upper lungs clear.  Tiny BILATERAL pleural effusions.  No pneumothorax.  Bones demineralized.  IMPRESSION: Increased bibasilar atelectasis versus infiltrate.   Electronically Signed   By: Ulyses Southward M.D.   On: 08/13/2014 09:10   Dg Chest Port 1 View  08/14/2014   CLINICAL DATA:  Shortness of breath  EXAM: PORTABLE CHEST - 1 VIEW  COMPARISON:  08/13/2014  FINDINGS: Cardiac shadow is stable. There again noted changes consistent with aortic valve repair. Lungs again demonstrate patchy infiltrative change worse on the right than left. This is increased slightly in the interval from the prior exam particularly on the right. No pneumothorax is seen. No bony abnormality is noted.  IMPRESSION: Bibasilar changes right greater than left which have increased slightly in the interval from the prior exam.   Electronically Signed   By: Alcide Clever M.D.   On: 08/14/2014 07:32    ASSESSMENT / PLAN:  Acute Hypoxic respiratory Failure in the setting of Recurrent Aspiration  Pneumonia: Has poor cough mechanics. Severe dysphagia and significant deconditioning. All a recipe for recurrent aspiration. He is DNR which we have verified goals of care w/ his wife. He is NOT a candidate for NIPPV/BIPAP given poor cough mechanics and aspiration risk.   Recommendations Admit to SDU Empiric vanc/imipenem (need to cover also for HCAP) Strict NPO; may need Nasogastric feeding tube in short term for oral meds.  Supplemental Humidified O2 Aggressive oral hygiene measures.  D/c mucomyst  NTS PRN DNR confirmed NO BIPAP If gets worse we would highly recommend early transition to palliative focus  May need PEG if survives  Severe Sepsis d/t above  Recommendations  Admit to SDU Ck lactic acid, if > 2, give 30 ml/kg bolus of NS and cont IVFs Not a candidate for further Goal directed therapy (example Central access or pressors) given limitations of care discussed w/ spouse    Subacute SDH s/p evacuation complicated by severe dysphagia and deconditioning. Has not tolerated rehab work-outs. Recommendations Would continue to discuss goals of care Will need SNF (likely)  Anemia of critical illness: hgb stable. No evidence of bleeding rec Routine VTE precautions  Hypokalemia  rec  Replace via IV    See  above. Full DNR, but no longer an candidate for acute rehab. Has new hypoxic respiratory failure d/t recurrent aspiration events. Would keep NPO, maximize pulm hygiene and oral hygiene, add empiric HCAP coverage and avoid BIPAP. Family understands he could die from this. If he gets worse would transition to comfort early. We will s/o. Nothing for PCCM to offer    Simonne MartinetPeter E Babcock ACNP-BC [facility] Pulmonary/Critical Care Pager # [phone] OR # [phone] if no answer   08/14/2014, 10:52 AM   STAFF NOTE: I, Rory Percyaniel Anaih Brander, MD FACP have personally reviewed patient's available data, including medical history, events of note, physical examination and test results as part of my  evaluation. I have discussed with resident/NP and other care providers such as pharmacist, RN and RRT. In addition, I personally evaluated patient and elicited key findings of:  Ronchi, diffuse, Updated wife in full, mild distress, poor secretion control, HCAP, concern is worsening left with already sig rt base infiltrate, start aggressive nosocomial ABX given exposure in past, if declines - comfort care, NO NIMV will have high risk aspiration and worsen status, NTS important, to acute care cone, [facility], call if needed, if declines distress - comfort care  Mcarthur Rossettianiel J. Tyson AliasFeinstein, MD, FACP Pgr: [phone] [facility] 08/14/2014 12:17 PM

## 2014-08-14 NOTE — Significant Event (Signed)
Patient has had steady overall decline in the last few days.  Respiratory status significantly compromised this morning.  Pulmonology consulted, made choice to transfer patient off rehab for closer monitoring.  Patient is presently resting in bed with wife at bedside, comfortable using oxygen via venturi mask.  IVF/ abx infusing.[place] [facility] [place] [facility] [place], Charity fundraiserN.

## 2014-08-14 NOTE — Progress Notes (Addendum)
Pt arrived from rehab, performed NTS & oral swab, suction, cleaned nares w/saline. Pt remains on venti mask, still desats sharply & quickly when mask off temp. Will continue to monitor. Domingo DimesMaggie Vaughn, RN

## 2014-08-14 NOTE — H&P (Signed)
[facility] History and Physical                                                                                    Patient Demographics  Roy Mitchell, is a 81 y.o. male  MRN: [id number]   DOB - 11-Jun-1924  Admit Date - 07/28/2014  Outpatient Primary MD for the patient is Lonie Peak, PA-C   With History of -  Past Medical History  Diagnosis Date  . Hypercholesterolemia   . Benign prostatic hypertrophy   . GERD (gastroesophageal reflux disease)   . DDD (degenerative disc disease), cervical   . Tinnitus   . Multinodular goiter   . Prediabetes   . DDD (degenerative disc disease), lumbar   . Hypertension   . Pulmonary nodules   . Anxiety disorder       Past Surgical History  Procedure Laterality Date  . Aortic valve replacement  01/2014  . History of blood transfusion    . Hernia repair      6 months old  . Craniotomy Left 06/26/2014    Procedure: Left Parietal Craniotomy for Subdural Hematoma;  Surgeon: Barnett Abu, MD;  Location: [facility] NEURO ORS;  Service: Neurosurgery;  Laterality: Left;    in for   No chief complaint on file.    HPI  Roy Mitchell  is a 81 y.o. male, with a pmh of TVAR, vertigo, peripheral artery disease, and recurrent aspiration.  He was most recently admitted to [facility] in December 2015 with a left parietal subdural hematoma after a fall. He underwent craniotomy on December 11.  His postop course was complicated by hypoxia and total right lung collapse which necessitated bronchoscopy to remove mucous plugging. Further, he developed severe dysphagia and was kept NPO. After he stabilized he was transferred to [facility]. After further improvement he was transferred to [facility] inpatient rehabilitation on January 12.  Since being in rehabilitation he has had difficulty with secretions and oral pharyngeal dysphagia.  He has been followed by SLP and most recently on a D3 diet with nectar thick. On the morning of August 14, 2014 he was found to be  lethargic and hypoxic with an oxygen sat of 84%. Inpatient rehabilitation consulted [facility] who recommended starting IV antibiotics,  transferring Mr. Plagge to an inpatient stepdown unit and making him NPO. The patient was made DNR, DNI.  Review  of Systems    In addition to the HPI above.  I am unable to gather a detailed ROS from the patient as he is currently somewhat lethargic and on a non rebreather mask.   No Chest pain No Abdominal pain, No Nausea or Vomiting, Bowel movements are regular, No dysuria   Social History History  Substance Use Topics  . Smoking status: Former Games developer  . Smokeless tobacco: Never Used     Comment: QUIT IN 1970  . Alcohol Use: No     Family History Family History  Problem Relation Age of Onset  . Congestive Heart Failure Mother      Prior to Admission medications   Medication Sig Start Date End Date Taking? Authorizing Provider  ADVAIR DISKUS 250-50 MCG/DOSE AEPB Inhale 1  puff into the lungs 2 (two) times daily.  06/24/14  Yes Historical Provider, MD  clopidogrel (PLAVIX) 75 MG tablet Take 75 mg by mouth daily. 06/24/14  Yes Historical Provider, MD  escitalopram (LEXAPRO) 5 MG tablet Take 5 mg by mouth daily. 06/23/14  Yes Historical Provider, MD  finasteride (PROSCAR) 5 MG tablet Take 5 mg by mouth daily.   Yes Historical Provider, MD  levETIRAcetam 250 mg in sodium chloride 0.9 % 100 mL Inject 250 mg into the vein every 12 (twelve) hours. 07/08/14  Yes William S Minor, NP  LORazepam (ATIVAN) 2 MG tablet Take 2 mg by mouth at bedtime. 06/16/14  Yes Historical Provider, MD  pantoprazole (PROTONIX) 20 MG tablet Take 20 mg by mouth daily. 06/24/14  Yes Historical Provider, MD  tamsulosin (FLOMAX) 0.4 MG CAPS capsule Take 0.4 mg by mouth at bedtime.   Yes Historical Provider, MD    Allergies  Allergen Reactions  . Ace Inhibitors Cough  . Penicillins Rash    Physical Exam  Vitals  Blood pressure 124/75, pulse 116, temperature 97.7 F  (36.5 C), temperature source Oral, resp. rate 20, height  (1.676 m), weight 79 kg (174 lb 2.6 oz), SpO2 92 %.   General: Elderly, frail man, lying in bed in NAD, very pleasant despite the non-rebreather mask.  ENT:  Ears and Eyes appear Normal, Conjunctivae clear, PERR.   Neck:  Supple Neck, No JVD, No cervical lymphadenopathy appreciated  Respiratory:  +increased work of breathing with coarse wet breath sounds bilaterally.  Cardiac:  Tachycardic, no frank rub or gallop.  Abdomen:  Positive Bowel Sounds, Abdomen Soft, Non tender, No organomegaly appreciated  Skin:  No Cyanosis, Normal Skin Turgor, No Skin Rash or Bruise.  Extremities:  1+ edema noted bilaterally.  Able to move all 4 extremities.  Left sided lower extremity much stronger than right.   Data Review  CBC  Recent Labs Lab 08/14/14 0750  WBC 15.5*  HGB 9.7*  HCT 30.6*  PLT 251  MCV 82.0  MCH 26.0  MCHC 31.7  RDW 16.9*   ------------------------------------------------------------------------------------------------------------------  Chemistries   Recent Labs Lab 08/14/14 0750  NA 143  K 3.2*  CL 103  CO2 32  GLUCOSE 117*  BUN 27*  CREATININE 0.63  CALCIUM 9.0    Urinalysis    Component Value Date/Time   COLORURINE YELLOW 07/29/2014 0936   APPEARANCEUR CLEAR 07/29/2014 0936   LABSPEC 1.023 07/29/2014 0936   PHURINE 6.0 07/29/2014 0936   GLUCOSEU NEGATIVE 07/29/2014 0936   HGBUR NEGATIVE 07/29/2014 0936   BILIRUBINUR NEGATIVE 07/29/2014 0936   KETONESUR NEGATIVE 07/29/2014 0936   PROTEINUR NEGATIVE 07/29/2014 0936   UROBILINOGEN 1.0 07/29/2014 0936   NITRITE NEGATIVE 07/29/2014 0936   LEUKOCYTESUR SMALL* 07/29/2014 0936    ----------------------------------------------------------------------------------------------------------------  Imaging results:   Dg Chest 2 View  08/13/2014   CLINICAL DATA:  Shortness of breath, weakness, cough, GERD, hypertension  EXAM: CHEST  2 VIEW   COMPARISON:  08/07/2014  FINDINGS: Enlargement of cardiac silhouette post AVR.  Atherosclerotic calcification aorta.  Mediastinal contours and pulmonary vascularity otherwise normal.  Increased bibasilar opacities question atelectasis versus infiltrate.  Upper lungs clear.  Tiny BILATERAL pleural effusions.  No pneumothorax.  Bones demineralized.  IMPRESSION: Increased bibasilar atelectasis versus infiltrate.   Electronically Signed   By: Ulyses Southward M.D.   On: 08/13/2014 09:10   Dg Chest 2 View  08/07/2014   CLINICAL DATA:  Fall, subdural hematoma, cough, history hypertension, hypercholesterolemia, former  smoker  EXAM: CHEST  2 VIEW  COMPARISON:  07/30/2014  FINDINGS: Borderline enlargement of cardiac silhouette post aortic valve replacement.  Calcified thoracic aorta.  Mediastinal contours and pulmonary vascularity otherwise normal.  Emphysematous changes and bronchitic changes consistent with COPD.  Bibasilar atelectasis.  Lungs otherwise clear.  No pleural effusion or pneumothorax.  Bones demineralized.  IMPRESSION: COPD changes with bibasilar atelectasis.   Electronically Signed   By: Ulyses Southward M.D.   On: 08/07/2014 10:40   Dg Chest 2 View  07/30/2014   CLINICAL DATA:  Subsequent evaluation for cough and shortness of breath for a few days  EXAM: CHEST  2 VIEW  COMPARISON:  07/28/2014  FINDINGS: Stable cardiac enlargement and aortic arch calcification as well as mesh graft proximal aorta. Vascular pattern normal with no evidence of edema infiltrate or effusion. Mild bibasilar opacities again appear most consistent with scarring or atelectasis.  IMPRESSION: Stable bibasilar opacities.   Electronically Signed   By: Esperanza Heir M.D.   On: 07/30/2014 13:51   Dg Chest 2 View  07/29/2014   CLINICAL DATA:  Aspiration pneumonia, wheezing  EXAM: CHEST  2 VIEW  COMPARISON:  Portable chest x-ray 07/08/2014  FINDINGS: There is parenchymal opacity remaining at the lung bases some of which may represent  atelectasis, but residual pneumonia cannot be excluded. No effusion is seen. Heart size is stable.  IMPRESSION: Persistent basilar opacities consistent with atelectasis, but pneumonia cannot be excluded.   Electronically Signed   By: Dwyane Dee M.D.   On: 07/29/2014 08:21   Dg Chest Port 1 View  08/14/2014   CLINICAL DATA:  Shortness of breath  EXAM: PORTABLE CHEST - 1 VIEW  COMPARISON:  08/13/2014  FINDINGS: Cardiac shadow is stable. There again noted changes consistent with aortic valve repair. Lungs again demonstrate patchy infiltrative change worse on the right than left. This is increased slightly in the interval from the prior exam particularly on the right. No pneumothorax is seen. No bony abnormality is noted.  IMPRESSION: Bibasilar changes right greater than left which have increased slightly in the interval from the prior exam.   Electronically Signed   By: Alcide Clever M.D.   On: 08/14/2014 07:32    Assessment & Plan  Principal Problem:   Acute respiratory failure with hypoxia Active Problems:   Traumatic subdural hematoma with loss of consciousness of 6 hours to 24 hours   Anxiety and depression   Mild reactive airways disease   Dysphagia, pharyngoesophageal phase   Aspiration pneumonia   Acute hypoxic respiratory failure with Sepsis Patient is stable on non-rebreather.  He is being transferred to step down.   He is a DNR / DNI.  Pulmonary recommends against bipap due to thick secretions. After discussions between Dr. Gonzella Lex, the patient, his wife and daugther it was decided to employ strong antibiotics, steroids, and supportive care (oxygen, suctioning, nebulizers) for 24 - 48 hours.  If there is not significant improvement the family would like to move towards comfort care and likely take Mr. Sirois home with hospice support.  Will add low dose morphine prn for respiratory distress.  Aspiration Pneumonia Per pulmonology's recommendation, Vanc and Imipenem have been  started. Strict NPO.  Family would like to avoid N/G tube if possible. No bipap recommended by Pulm as the patient has such thick secretions. Discussed PEG tube with wife.  After understanding that it will not prevent aspiration, she does not opt for PEG.  SDH s/p craniotomy with residual dysphagia  and deconditioning Failure to thrive scenario.  Patient is unable to effectively participate in CIR therapy.  Hypokalemia (mild) Replete via IVF.  Hx of Anxiety Disorder Will give IV ativan PRN anxiety or insomnia.    HTN Patient currently with soft -> normal blood pressures.  Not on blood pressure medications.  Hyperglycemia. Will continue SSI - Sensitive.  Anemia of critical illness No frank bleeding or hematoma.   Consults: Pulmonary CCM Palliative Medicine.   DVT Prophylaxis:  SCDs.  Give recent SDH.  AM Labs Ordered, also please review Full Orders  Family Communication:   Wife at bedside.  Dtr Larita FifeLynn via telephone.   Code Status:  DNR, No pressors, no bi-pap.  Likely DC to  Home vs residential hospice.  Condition:  Guarded.  Time spent in minutes : 60    York, Tora KindredMarianne L PA-C on 08/14/2014 at 11:57 AM  Between 7am to 7pm - Pager - [phone]  After 7pm go to [email or web address] - password [email or web address]  And look for the night coverage person covering me after hours  [facility]

## 2014-08-14 NOTE — Progress Notes (Signed)
ANTIBIOTIC CONSULT NOTE - INITIAL  Pharmacy Consult for vancomycin, primaxin Indication: pneumonia  Allergies  Allergen Reactions  . Ace Inhibitors Cough  . Penicillins Rash    Patient Measurements:   Adjusted Body Weight:   Vital Signs: Temp: 97.7 F (36.5 C) (01/29 0608) Temp Source: Oral (01/29 40980608) BP: 124/75 mmHg (01/29 11910608) Pulse Rate: 116 (01/29 0608) Intake/Output from previous day:   Intake/Output from this shift:    Labs:  Recent Labs  08/14/14 0750  WBC 15.5*  HGB 9.7*  PLT 251  CREATININE 0.63   Medical History: Past Medical History  Diagnosis Date  . Hypercholesterolemia   . Benign prostatic hypertrophy   . GERD (gastroesophageal reflux disease)   . DDD (degenerative disc disease), cervical   . Tinnitus   . Multinodular goiter   . Prediabetes   . DDD (degenerative disc disease), lumbar   . Hypertension   . Pulmonary nodules   . Anxiety disorder     Medications:  Prescriptions prior to admission  Medication Sig Dispense Refill Last Dose  . ADVAIR DISKUS 250-50 MCG/DOSE AEPB Inhale 1 puff into the lungs 2 (two) times daily.    07/27/2014  . finasteride (PROSCAR) 5 MG tablet Take 5 mg by mouth daily.   07/27/2014  . levETIRAcetam 250 mg in sodium chloride 0.9 % 100 mL Inject 250 mg into the vein every 12 (twelve) hours.   07/27/2014   Assessment: 81 yo male s/p SDH, admitted from rehab with severe deconditioning and possible aspiration pneumonia. WBC 15.5, cxr concerning for RLL infiltrate.   Goal of Therapy:  Vancomycin trough level 15-20 mcg/ml  Plan:  Primaxin 500 mg IV q8h Vancomycin 1500 mg IV x1 then 750 mg IV q12h Monitor renal fx, cultures, duration of therapy    Agapito GamesAlison Clevie Prout, PharmD, BCPS Clinical Pharmacist Pager: [phone] 08/14/2014 1:57 PM

## 2014-08-14 NOTE — Progress Notes (Signed)
ANTIBIOTIC CONSULT NOTE - INITIAL  Pharmacy Consult for vancomycin and imipenem Indication: pneumonia  Allergies  Allergen Reactions  . Ace Inhibitors Cough  . Penicillins Rash    Patient Measurements: Height: 5\' 6"  (167.6 cm) Weight: 174 lb 2.6 oz (79 kg) IBW/kg (Calculated) : 63.8 Adjusted Body Weight:   Vital Signs: Temp: 97.7 F (36.5 C) (01/29 0608) Temp Source: Oral (01/29 16100608) BP: 124/75 mmHg (01/29 96040608) Pulse Rate: 116 (01/29 0608) Intake/Output from previous day: 01/28 0701 - 01/29 0700 In: 480 [P.O.:480] Out: 201 [Urine:200; Stool:1] Intake/Output from this shift:    Labs:  Recent Labs  08/14/14 0750  WBC 15.5*  HGB 9.7*  PLT 251  CREATININE 0.63   Estimated Creatinine Clearance: 63.1 mL/min (by C-G formula based on Cr of 0.63). No results for input(s): VANCOTROUGH, VANCOPEAK, VANCORANDOM, GENTTROUGH, GENTPEAK, GENTRANDOM, TOBRATROUGH, TOBRAPEAK, TOBRARND, AMIKACINPEAK, AMIKACINTROU, AMIKACIN in the last 72 hours.   Microbiology: Recent Results (from the past 720 hour(s))  Culture, Urine     Status: None   Collection Time: 07/16/14  4:51 PM  Result Value Ref Range Status   Specimen Description URINE, RANDOM  Final   Special Requests NONE  Final   Colony Count NO GROWTH Performed at [facility]   Final   Culture NO GROWTH Performed at [facility]   Final   Report Status 07/19/2014 FINAL  Final  Urine culture     Status: None   Collection Time: 07/29/14  9:36 AM  Result Value Ref Range Status   Specimen Description URINE, CLEAN CATCH  Final   Special Requests NONE  Final   Colony Count   Final    4,000 COLONIES/ML Performed at [facility]    Culture   Final    INSIGNIFICANT GROWTH Performed at [facility]    Report Status 07/30/2014 FINAL  Final  Culture, Urine     Status: None   Collection Time: 08/07/14  3:39 PM  Result Value Ref Range Status   Specimen Description URINE, CLEAN CATCH  Final   Special Requests NONE  Final   Colony Count   Final    4,000 COLONIES/ML Performed at [facility]    Culture   Final    INSIGNIFICANT GROWTH Performed at [facility]    Report Status 08/08/2014 FINAL  Final    Medical History: Past Medical History  Diagnosis Date  . Hypercholesterolemia   . Benign prostatic hypertrophy   . GERD (gastroesophageal reflux disease)   . DDD (degenerative disc disease), cervical   . Tinnitus   . Multinodular goiter   . Prediabetes   . DDD (degenerative disc disease), lumbar   . Hypertension   . Pulmonary nodules   . Anxiety disorder     Medications:  Scheduled:  . antiseptic oral rinse  15 mL Mouth Rinse TID  . budesonide (PULMICORT) nebulizer solution  0.5 mg Nebulization BID  . feeding supplement (ENSURE)  1 Container Oral TID BM  . feeding supplement (PRO-STAT SUGAR FREE 64)  30 mL Oral TID WC  . finasteride  5 mg Oral QHS  . insulin aspart  0-5 Units Subcutaneous QHS  . insulin aspart  0-9 Units Subcutaneous TID WC  . ketotifen  1 drop Both Eyes BID  . levalbuterol  0.63 mg Nebulization TID PC  . levETIRAcetam  250 mg Oral BID  . megestrol  400 mg Oral Daily  . metFORMIN  500 mg Oral BID WC  . metoprolol  tartrate  25 mg Oral BID  . mirtazapine  15 mg Oral QHS  . modafinil  100 mg Oral Daily  . omega-3 acid ethyl esters  1 g Oral BID  . potassium chloride  20 mEq Oral BID  . predniSONE  5 mg Oral Q breakfast   Infusions:  . sodium chloride 75 mL/hr at 08/14/14 1019   Assessment: 81 yo male with pneumonia will be started on vancomycin and imipenem.  SCr 0.63 (CrCl ~46 if adjust SCr to 1 due to age) and WBC 15.5 K on 08/14/14.  No new cultures at this time.   Goal of Therapy:  Vancomycin trough level 15-20 mcg/ml  Plan:  - vancomycin 750 mg iv q12h and imipenem 500 mg iv q8h - monitor renal function  - follow on antibiotic plan; check vancomycin trough when it's approriate  Laurelai Lepp, Tsz-Yin 08/14/2014,11:01 AM

## 2014-08-15 ENCOUNTER — Inpatient Hospital Stay (HOSPITAL_COMMUNITY): Payer: Medicare Other

## 2014-08-15 ENCOUNTER — Encounter (HOSPITAL_COMMUNITY): Payer: Self-pay | Admitting: *Deleted

## 2014-08-15 DIAGNOSIS — S065XAA Traumatic subdural hemorrhage with loss of consciousness status unknown, initial encounter: Secondary | ICD-10-CM | POA: Diagnosis present

## 2014-08-15 DIAGNOSIS — R7989 Other specified abnormal findings of blood chemistry: Secondary | ICD-10-CM | POA: Diagnosis present

## 2014-08-15 DIAGNOSIS — D638 Anemia in other chronic diseases classified elsewhere: Secondary | ICD-10-CM

## 2014-08-15 DIAGNOSIS — I6200 Nontraumatic subdural hemorrhage, unspecified: Secondary | ICD-10-CM

## 2014-08-15 DIAGNOSIS — R778 Other specified abnormalities of plasma proteins: Secondary | ICD-10-CM | POA: Diagnosis present

## 2014-08-15 DIAGNOSIS — R1314 Dysphagia, pharyngoesophageal phase: Secondary | ICD-10-CM

## 2014-08-15 DIAGNOSIS — S065X9A Traumatic subdural hemorrhage with loss of consciousness of unspecified duration, initial encounter: Secondary | ICD-10-CM | POA: Diagnosis present

## 2014-08-15 DIAGNOSIS — F418 Other specified anxiety disorders: Secondary | ICD-10-CM

## 2014-08-15 DIAGNOSIS — J690 Pneumonitis due to inhalation of food and vomit: Secondary | ICD-10-CM

## 2014-08-15 DIAGNOSIS — I10 Essential (primary) hypertension: Secondary | ICD-10-CM

## 2014-08-15 DIAGNOSIS — J9601 Acute respiratory failure with hypoxia: Secondary | ICD-10-CM

## 2014-08-15 DIAGNOSIS — R739 Hyperglycemia, unspecified: Secondary | ICD-10-CM | POA: Diagnosis present

## 2014-08-15 LAB — EXPECTORATED SPUTUM ASSESSMENT W REFEX TO RESP CULTURE

## 2014-08-15 LAB — BASIC METABOLIC PANEL
Anion gap: 8 (ref 5–15)
BUN: 29 mg/dL — ABNORMAL HIGH (ref 6–23)
CO2: 28 mmol/L (ref 19–32)
Calcium: 8.5 mg/dL (ref 8.4–10.5)
Chloride: 107 mmol/L (ref 96–112)
Creatinine, Ser: 0.63 mg/dL (ref 0.50–1.35)
GFR calc non Af Amer: 85 mL/min — ABNORMAL LOW (ref >90)
Glucose, Bld: 150 mg/dL — ABNORMAL HIGH (ref 70–99)
Potassium: 3.6 mmol/L (ref 3.5–5.1)
SODIUM: 143 mmol/L (ref 135–145)

## 2014-08-15 LAB — GLUCOSE, CAPILLARY
Glucose-Capillary: 146 mg/dL — ABNORMAL HIGH (ref 70–99)
Glucose-Capillary: 125 mg/dL — ABNORMAL HIGH (ref 70–99)
Glucose-Capillary: 139 mg/dL — ABNORMAL HIGH (ref 70–99)
Glucose-Capillary: 139 mg/dL — ABNORMAL HIGH (ref 70–99)
Glucose-Capillary: 137 mg/dL — ABNORMAL HIGH (ref 70–99)
Glucose-Capillary: 131 mg/dL — ABNORMAL HIGH (ref 70–99)

## 2014-08-15 LAB — BLOOD GAS, ARTERIAL
Acid-Base Excess: 1.2 mmol/L (ref 0.0–2.0)
BICARBONATE: 25 meq/L — AB (ref 20.0–24.0)
DRAWN BY: 281201
FIO2: 0.55 %
O2 SAT: 95.4 %
PATIENT TEMPERATURE: 98.6
PCO2 ART: 37.7 mmHg (ref 35.0–45.0)
TCO2: 26.2 mmol/L (ref 0–100)
pH, Arterial: 7.437 (ref 7.350–7.450)
pO2, Arterial: 73.4 mmHg — ABNORMAL LOW (ref 80.0–100.0)

## 2014-08-15 LAB — CBC
HCT: 27.2 % — ABNORMAL LOW (ref 39.0–52.0)
Hemoglobin: 8.5 g/dL — ABNORMAL LOW (ref 13.0–17.0)
MCH: 26.2 pg (ref 26.0–34.0)
MCHC: 31.3 g/dL (ref 30.0–36.0)
MCV: 84 fL (ref 78.0–100.0)
Platelets: 227 10*3/uL (ref 150–400)
RBC: 3.24 MIL/uL — AB (ref 4.22–5.81)
RDW: 17.2 % — ABNORMAL HIGH (ref 11.5–15.5)
WBC: 15.9 10*3/uL — AB (ref 4.0–10.5)

## 2014-08-15 LAB — TROPONIN I
TROPONIN I: 0.06 ng/mL — AB (ref <0.031)
Troponin I: 0.11 ng/mL — ABNORMAL HIGH (ref <0.031)
Troponin I: 0.3 ng/mL — ABNORMAL HIGH (ref <0.031)

## 2014-08-15 LAB — EXPECTORATED SPUTUM ASSESSMENT W GRAM STAIN, RFLX TO RESP C

## 2014-08-15 LAB — INFLUENZA PANEL BY PCR (TYPE A & B)
H1N1FLUPCR: NOT DETECTED
INFLAPCR: NEGATIVE
INFLBPCR: NEGATIVE

## 2014-08-15 LAB — HEPARIN LEVEL (UNFRACTIONATED): Heparin Unfractionated: 0.39 IU/mL (ref 0.30–0.70)

## 2014-08-15 MED ORDER — ACETYLCYSTEINE 10% NICU INHALATION SOLUTION
2.0000 mL | Freq: Once | RESPIRATORY_TRACT | Status: DC
  Filled 2014-08-15: qty 2

## 2014-08-15 MED ORDER — CHLORHEXIDINE GLUCONATE CLOTH 2 % EX PADS
6.0000 | MEDICATED_PAD | Freq: Every day | CUTANEOUS | Status: DC
  Administered 2014-08-16: 6 via TOPICAL

## 2014-08-15 MED ORDER — HEPARIN (PORCINE) IN NACL 100-0.45 UNIT/ML-% IJ SOLN
1550.0000 [IU]/h | INTRAMUSCULAR | Status: DC
  Administered 2014-08-15 – 2014-08-16 (×3): 1550 [IU]/h via INTRAVENOUS
  Filled 2014-08-15 (×3): qty 250

## 2014-08-15 MED ORDER — DM-GUAIFENESIN ER 30-600 MG PO TB12
1.0000 | ORAL_TABLET | Freq: Two times a day (BID) | ORAL | Status: DC
  Filled 2014-08-15 (×2): qty 1

## 2014-08-15 MED ORDER — ACETYLCYSTEINE 20 % IN SOLN
4.0000 mL | Freq: Once | RESPIRATORY_TRACT | Status: AC
  Administered 2014-08-15: 4 mL via RESPIRATORY_TRACT
  Filled 2014-08-15: qty 4

## 2014-08-15 MED ORDER — ASPIRIN 300 MG RE SUPP
150.0000 mg | Freq: Every day | RECTAL | Status: DC
  Administered 2014-08-15 – 2014-08-16 (×2): 150 mg via RECTAL
  Filled 2014-08-15 (×2): qty 1

## 2014-08-15 MED ORDER — ACETYLCYSTEINE 20 % IN SOLN
4.0000 mL | Freq: Two times a day (BID) | RESPIRATORY_TRACT | Status: DC
  Administered 2014-08-15 – 2014-08-16 (×2): 4 mL via RESPIRATORY_TRACT
  Filled 2014-08-15 (×3): qty 4

## 2014-08-15 MED ORDER — HEPARIN (PORCINE) IN NACL 100-0.45 UNIT/ML-% IJ SOLN
1200.0000 [IU]/h | INTRAMUSCULAR | Status: DC
  Administered 2014-08-15: 1200 [IU]/h via INTRAVENOUS
  Filled 2014-08-15 (×2): qty 250

## 2014-08-15 MED ORDER — HEPARIN BOLUS VIA INFUSION
3000.0000 [IU] | Freq: Once | INTRAVENOUS | Status: AC
  Administered 2014-08-15: 3000 [IU] via INTRAVENOUS
  Filled 2014-08-15: qty 3000

## 2014-08-15 MED ORDER — HEPARIN BOLUS VIA INFUSION
4000.0000 [IU] | Freq: Once | INTRAVENOUS | Status: AC
  Administered 2014-08-15: 4000 [IU] via INTRAVENOUS
  Filled 2014-08-15: qty 4000

## 2014-08-15 MED ORDER — MUPIROCIN 2 % EX OINT
1.0000 "application " | TOPICAL_OINTMENT | Freq: Two times a day (BID) | CUTANEOUS | Status: DC
  Administered 2014-08-15 – 2014-08-17 (×3): 1 via NASAL
  Filled 2014-08-15 (×2): qty 22

## 2014-08-15 NOTE — Progress Notes (Signed)
Attempt to notify daughter about change in pt was made to phone number on chart. Unable to reach daughter per request. Wife was called and notify of pt's change. Will continue to monitor pt and update family.

## 2014-08-15 NOTE — Progress Notes (Signed)
[place] TEAM 1 - Stepdown/ICU TEAM Progress Note  Roy KindsSherwood P Mitchell ZOX:[id number] DOB: Apr 09, 1924 DOA: 08/14/2014 PCP: Arlyss QueenONROY,NATHAN, PA-C  Admit HPI / Brief Narrative: Roy SeltzerSherwood Mitchell is a 81 y.o. WM PMHx anxiety disorder TVAR, vertigo, peripheral artery disease, Recurrent Aspiration, pulmonary nodules, multinodular goiter. He was most recently admitted [facility] December 2015 with a left parietal subdural hematoma after a fall. He underwent craniotomy on December 11. His postop course was complicated by hypoxia and total right lung collapse which necessitated bronchoscopy to remove mucous plugging. Further, he developed severe dysphagia and was kept NPO. After he stabilized [facility]. After further improvement he was [facility] inpatient [facility] on January 12. Since being in rehabilitation he has had difficulty with secretions and oral pharyngeal dysphagia. He has been followed by SLP and most recently on a D3 diet with nectar thick. On the morning of August 14, 2014 he was found to be lethargic and hypoxic with an oxygen sat of 84%. Inpatient rehabilitation consulted [facility] who recommended starting IV antibiotics, transferring Roy Mitchell to an inpatient stepdown unit and making him NPO. The patient was made DNR, DNI.   HPI/Subjective: 1/30 A/O x 2 ( does not know when, why), Follows commands  Assessment/Plan: Acute hypoxic respiratory failure with Sepsis -Titrate O2 to maintain SPO2 > 93%  -Currently on nonrebreather  -Pulmonary recommends against bipap due to thick secretions. -Patient with unknown cardiac function obtain echocardiogram  Aspiration Pneumonia -Continue  Vanc and Imipenem  -Strict NPO. Family would like to avoid N/G tube if possible. -NTS  BID  -Solu-Medrol 40 mg BID -. Xopenex QID -Mucomyst BID with Xopenex -PEG placement rejected by family.  SDH s/p craniotomy with residual dysphagia and  deconditioning -Failure to thrive scenario. Patient is unable to effectively participate in CIR therapy. -Head CT appears stable see results below  Hypokalemia (mild) -Monitor closely and Replete via IVF.   Anxiety Disorder -Continue IV Ativan PRN anxiety  HTN -Currently BP soft, gently hydrating secondary to unknown cardiac functionality.  -Hold all BP meds  Elevated troponin -Most likely secondary to demand ischemia will trend -Per checkout notes cardiology where of increased troponin and recommended aspirin and heparin  Hyperglycemia. -Will continue SSI - Sensitive.  Anemia of critical illness/chronic disease -No frank bleeding or hematoma.   Code Status: FULL Family Communication: family present at time of exam Disposition Plan: SNF vs home health vs [facility] care; dependent upon patient's respiratory treatment    Consultants: Dr. Gershon Cullave Feinstein (PCCM)   Procedure/Significant Events: 1/29 PCXR;-changes consistent with AV repair. -patchy infiltrative change Rt > Lt  1/30 CT head without contrast;5 mm residual chronic LEFT subdural hematoma. -Slight mass effect on the subjacent sulci without midline shift.  -Resolution of parafalcine subdural hematoma. -Stable moderate to severe global parenchymal brain volume loss.   Culture 1/29 MRSA by PCR positive 1/30 sputum pending 1/30 respiratory virus panel pending 1/30 influenza panel pending 1/30 blood pending 1/30 urine pending  Antibiotics: Primaxin 1/29>> Vancomycin 1/29>>  DVT prophylaxis: Heparin   Devices    LINES / TUBES:      Continuous Infusions: . heparin 1,200 Units/hr (08/15/14 0305)  . sodium chloride 0.9 % 1,000 mL with potassium chloride 40 mEq infusion 75 mL/hr at 08/14/14 2140    Objective: VITAL SIGNS: Temp: 98.2 F (36.8 C) (01/30 0900) Temp Source: Oral (01/30 0900) BP: 96/66 mmHg (01/30 0900) Pulse Rate: 108 (01/30 0900) SPO2; FIO2:   Intake/Output Summary (Last  24  hours) at 08/15/14 1058 Last data filed at 08/15/14 0900  Gross per 24 hour  Intake    200 ml  Output    450 ml  Net   -250 ml     Exam: General: A/O x 2 ( does not know when, why), Follows commands, mild acute respiratory distress secondary to pneumonia and inability to clear secretions effectively. Lungs: diffuse rhonchi  Rt > Lt, without wheezes or crackles Cardiovascular: Tachycardic, Regular rhythm without murmur gallop or rub normal S1 and S2 Abdomen: Nontender, nondistended, soft, bowel sounds positive, no rebound, no ascites, no appreciable mass Extremities: No significant cyanosis, clubbing, or edema bilateral lower extremities  Data Reviewed: Basic Metabolic Panel:  Recent Labs Lab 08/14/14 0750 08/15/14 0235  NA 143 143  K 3.2* 3.6  CL 103 107  CO2 32 28  GLUCOSE 117* 150*  BUN 27* 29*  CREATININE 0.63 0.63  CALCIUM 9.0 8.5   Liver Function Tests: No results for input(s): AST, ALT, ALKPHOS, BILITOT, PROT, ALBUMIN in the last 168 hours. No results for input(s): LIPASE, AMYLASE in the last 168 hours. No results for input(s): AMMONIA in the last 168 hours. CBC:  Recent Labs Lab 08/14/14 0750 08/15/14 0235  WBC 15.5* 15.9*  HGB 9.7* 8.5*  HCT 30.6* 27.2*  MCV 82.0 84.0  PLT 251 227   Cardiac Enzymes:  Recent Labs Lab 08/14/14 1454 08/14/14 1955 08/15/14 0235 08/15/14 0915  TROPONINI 0.06* 0.39* 0.11* 0.30*   BNP (last 3 results) No results for input(s): PROBNP in the last 8760 hours. CBG:  Recent Labs Lab 08/14/14 0656 08/14/14 0812 08/14/14 1630 08/14/14 2039 08/15/14 0032  GLUCAP 123* 121* 111* 135* 146*    Recent Results (from the past 240 hour(s))  Culture, Urine     Status: None   Collection Time: 08/07/14  3:39 PM  Result Value Ref Range Status   Specimen Description URINE, CLEAN CATCH  Final   Special Requests NONE  Final   Colony Count   Final    4,000 COLONIES/ML Performed at [facility]    Culture   Final     INSIGNIFICANT GROWTH Performed at [facility]    Report Status 08/08/2014 FINAL  Final  MRSA PCR Screening     Status: Abnormal   Collection Time: 08/14/14 12:39 PM  Result Value Ref Range Status   MRSA by PCR POSITIVE (A) NEGATIVE Final    Comment:        The GeneXpert MRSA Assay (FDA approved for NASAL specimens only), is one component of a comprehensive MRSA colonization surveillance program. It is not intended to diagnose MRSA infection nor to guide or monitor treatment for MRSA infections. RESULT CALLED TO, READ BACK BY AND VERIFIED WITH: Victorino Dike RN 16:00 08/14/14 ([email or web address])      Studies:  Recent x-ray studies have been reviewed in detail by the Attending Physician  Scheduled Meds:  Scheduled Meds: . aspirin  150 mg Rectal Daily  . budesonide (PULMICORT) nebulizer solution  0.25 mg Nebulization BID  . imipenem-cilastatin  500 mg Intravenous 3 times per day  . insulin aspart  0-9 Units Subcutaneous 6 times per day  . ketotifen  1 drop Both Eyes BID  . levalbuterol  0.63 mg Nebulization Q6H  . levETIRAcetam  250 mg Intravenous Q12H  . methylPREDNISolone (SOLU-MEDROL) injection  40 mg Intravenous Q12H  . metoprolol  2.5 mg Intravenous Q6H  . scopolamine  1 patch Transdermal Q72H  . sodium chloride  3 mL Intravenous Q12H    Time spent on care of this patient: 40 mins   Drema [facility]  [facility] Office  [phone] Pager - [phone]  On-Call/Text Page:      [email or web address]      password [email or web address]  If 7PM-7AM, please contact night-coverage [email or web address] Password [email or web address] 08/15/2014, 10:58 AM   LOS: 1 day   Care during the described time interval was provided by me .  I have reviewed this patient's available data, including medical history, events of note, physical examination, radiology studies and test results as part of my evaluation  Carolyne Littles, MD [phone] Pager

## 2014-08-15 NOTE — Progress Notes (Signed)
ANTICOAGULATION CONSULT NOTE - Initial Consult  Pharmacy Consult for heparin Indication: chest pain/ACS  Allergies  Allergen Reactions  . Ace Inhibitors Cough  . Penicillins Rash    Patient Measurements: Height: 5\' 7"  (170.2 cm) Weight: 208 lb 15.9 oz (94.8 kg) IBW/kg (Calculated) : 66.1 Heparin Dosing Weight: 90kg  Vital Signs: Temp: 98.6 F (37 C) (01/30 0040) Temp Source: Axillary (01/30 0040) BP: 114/67 mmHg (01/30 0040) Pulse Rate: 89 (01/30 0040)  Labs:  Recent Labs  08/14/14 0750 08/14/14 1454 08/14/14 1955  HGB 9.7*  --   --   HCT 30.6*  --   --   PLT 251  --   --   CREATININE 0.63  --   --   TROPONINI  --  0.06* 0.39*    Estimated Creatinine Clearance: 70.1 mL/min (by C-G formula based on Cr of 0.63).   Medical History: Past Medical History  Diagnosis Date  . Hypercholesterolemia   . Benign prostatic hypertrophy   . GERD (gastroesophageal reflux disease)   . DDD (degenerative disc disease), cervical   . Tinnitus   . Multinodular goiter   . Prediabetes   . DDD (degenerative disc disease), lumbar   . Hypertension   . Pulmonary nodules   . Anxiety disorder     Medications:  Prescriptions prior to admission  Medication Sig Dispense Refill Last Dose  . ADVAIR DISKUS 250-50 MCG/DOSE AEPB Inhale 1 puff into the lungs 2 (two) times daily.    07/27/2014  . finasteride (PROSCAR) 5 MG tablet Take 5 mg by mouth daily.   07/27/2014  . levETIRAcetam 250 mg in sodium chloride 0.9 % 100 mL Inject 250 mg into the vein every 12 (twelve) hours.   07/27/2014   Scheduled:  . aspirin  150 mg Rectal Daily  . budesonide (PULMICORT) nebulizer solution  0.25 mg Nebulization BID  . imipenem-cilastatin  500 mg Intravenous 3 times per day  . insulin aspart  0-9 Units Subcutaneous 6 times per day  . ketotifen  1 drop Both Eyes BID  . levalbuterol  0.63 mg Nebulization Q6H  . levETIRAcetam  250 mg Intravenous Q12H  . methylPREDNISolone (SOLU-MEDROL) injection  40 mg  Intravenous Q12H  . metoprolol  2.5 mg Intravenous Q6H  . scopolamine  1 patch Transdermal Q72H  . sodium chloride  3 mL Intravenous Q12H   Infusions:  . sodium chloride 0.9 % 1,000 mL with potassium chloride 40 mEq infusion 75 mL/hr at 08/14/14 2140    Assessment: 81yo male was transferred from inpt rehab to SDU for acute respiratory failure and sepsis, now w/ rising troponin, to begin heparin.  Goal of Therapy:  Heparin level 0.3-0.7 units/ml Monitor platelets by anticoagulation protocol: Yes   Plan:  Will give heparin 4000 units IV bolus x1 followed by gtt at 1200 units/hr and monitor heparin levels and CBC.  Vernard GamblesVeronda Demeco Ducksworth, PharmD, BCPS  08/15/2014,1:17 AM

## 2014-08-15 NOTE — Progress Notes (Signed)
RT NTS patient got out moderate thick yellow secretions but patient cried out to stop.

## 2014-08-15 NOTE — Progress Notes (Signed)
[facility] progress note. Chief complaint. Elevated troponins. History of present illness. This 81 year old male admitted with lethargy and hypoxia secondary to acute respiratory failure secondary to pneumonia. Pneumonia likely aspiration in origin. Patient was recently hospitalized in December 2015 following a fall and underwent craniotomy for a subdural hematoma. Initial troponin resulted elevated 0.06. Second troponin has now resulted further elevated 0.39. I requested a 12-lead EKG and this was obtained showing flipped T waves in V1-V3 which appears to be new. I discussed the case with Dr. Ace GinsVora of cardiology and he felt that utilization of aspirin and heparin would be appropriate. Because the patient is status post craniotomy with subdural hematoma 06/26/14 I discussed the case with neurosurgery who had seen the patient last December. They recommended a CT scan of the brain to evaluate for signs of further bleed or hematoma worsening. If no worsening noted then anti-coagulation with heparin and aspirin would be appropriate. CT scan as now resulted showing a 5 mm residual chronic left subdural hematoma subjacent to craniotomy with probable membrane formation. Slight mass effect on the subjacent Salk I without midline shift. Resolution of parafalcine subdural hematoma. Physical exam. Vital signs. Temperature 98.6, pulse 89, respiration 23, blood pressure 114/67. O2 sats 96%. General appearance. Frail elderly male who is alert and in no distress. He denies chest pain. Respiratory. Course rhonchi heard throughout. No distress and stable O2 sats. Abdomen. Soft and obese with positive bowel sounds. No pain. Impression/plan. Problem #1. Elevated troponin. Suspect this may represent demand ischemia due to sepsis/pneumonia. As per my discussion with cardiology and neurosurgery I will start rectal suppository aspirin 150 mg daily including now. I've also initiated heparin drip per pharmacy protocol. Defer  decision regarding cardiology consult to a.m. rounding physician.

## 2014-08-15 NOTE — Progress Notes (Signed)
Pt HR changed to A-fib. Callahan notefied. EKG completed will continue to monitor pt

## 2014-08-15 NOTE — Progress Notes (Signed)
Notified Dr. Joseph ArtWoods of pts decreased urinary output. New orders received. Will continue to monitor pt.

## 2014-08-15 NOTE — Progress Notes (Signed)
ANTICOAGULATION CONSULT NOTE - Follow-Up Consult  Pharmacy Consult for heparin Indication: chest pain/ACS  Allergies  Allergen Reactions  . Ace Inhibitors Cough  . Penicillins Rash    Patient Measurements: Height: 5\' 7"  (170.2 cm) Weight: 215 lb 2.7 oz (97.6 kg) IBW/kg (Calculated) : 66.1 Heparin Dosing Weight: 90kg  Vital Signs: Temp: 98.6 F (37 C) (01/30 1943) Temp Source: Oral (01/30 1943) BP: 107/68 mmHg (01/30 1943) Pulse Rate: 94 (01/30 1943)  Labs:  Recent Labs  08/14/14 0750  08/15/14 0235 08/15/14 0915 08/15/14 1935  HGB 9.7*  --  8.5*  --   --   HCT 30.6*  --  27.2*  --   --   PLT 251  --  227  --   --   HEPARINUNFRC  --   --   --  <0.10* 0.39  CREATININE 0.63  --  0.63  --   --   TROPONINI  --   < > 0.11* 0.30* 0.06*  < > = values in this interval not displayed.  Estimated Creatinine Clearance: 71 mL/min (by C-G formula based on Cr of 0.63).   Medical History: Past Medical History  Diagnosis Date  . Hypercholesterolemia   . Benign prostatic hypertrophy   . GERD (gastroesophageal reflux disease)   . DDD (degenerative disc disease), cervical   . Tinnitus   . Multinodular goiter   . Prediabetes   . DDD (degenerative disc disease), lumbar   . Hypertension   . Pulmonary nodules   . Anxiety disorder     Medications:  Prescriptions prior to admission  Medication Sig Dispense Refill Last Dose  . ADVAIR DISKUS 250-50 MCG/DOSE AEPB Inhale 1 puff into the lungs 2 (two) times daily.    november  . albuterol (PROVENTIL HFA;VENTOLIN HFA) 108 (90 BASE) MCG/ACT inhaler Inhale 1 puff into the lungs every 6 (six) hours as needed for wheezing or shortness of breath.   december  . albuterol (PROVENTIL) (2.5 MG/3ML) 0.083% nebulizer solution Take 2.5 mg by nebulization every 6 (six) hours as needed for wheezing or shortness of breath.   Past Week at Unknown time  . Calcium Carb-Cholecalciferol 600-200 MG-UNIT TABS Take 1 tablet by mouth 2 (two) times daily.    december  . escitalopram (LEXAPRO) 5 MG tablet Take 1 tablet by mouth daily.  11 december  . finasteride (PROSCAR) 5 MG tablet Take 5 mg by mouth daily.   december  . hydroxypropyl methylcellulose / hypromellose (ISOPTO TEARS / GONIOVISC) 2.5 % ophthalmic solution Place 1-2 drops into both eyes daily.   Past Week at Unknown time  . ibuprofen (ADVIL,MOTRIN) 200 MG tablet Take 200 mg by mouth every 6 (six) hours as needed for mild pain or moderate pain.   december  . LORazepam (ATIVAN) 2 MG tablet Take 3 mg by mouth at bedtime.  5 december  . Multiple Vitamins-Minerals (MULTIVITAMIN ADULTS 50+) TABS Take 1 tablet by mouth daily.   december  . pantoprazole (PROTONIX) 40 MG tablet Take 40 mg by mouth daily.   december at Unknown time  . tamsulosin (FLOMAX) 0.4 MG CAPS capsule Take 0.4 mg by mouth.   december  . Vitamins-Lipotropics (LIPO-FLAVONOID PLUS) TABS Take 1 tablet by mouth 2 (two) times daily.   december  . levETIRAcetam 250 mg in sodium chloride 0.9 % 100 mL Inject 250 mg into the vein every 12 (twelve) hours. (Patient not taking: Reported on 08/15/2014)   Not Taking at Unknown time   Scheduled:  . acetylcysteine  4 mL Nebulization BID  . aspirin  150 mg Rectal Daily  . budesonide (PULMICORT) nebulizer solution  0.25 mg Nebulization BID  . [START ON 08/16/2014] Chlorhexidine Gluconate Cloth  6 each Topical Q0600  . imipenem-cilastatin  500 mg Intravenous 3 times per day  . insulin aspart  0-9 Units Subcutaneous 6 times per day  . ketotifen  1 drop Both Eyes BID  . levalbuterol  0.63 mg Nebulization Q6H  . levETIRAcetam  250 mg Intravenous Q12H  . methylPREDNISolone (SOLU-MEDROL) injection  40 mg Intravenous Q12H  . metoprolol  2.5 mg Intravenous Q6H  . mupirocin ointment  1 application Nasal BID  . scopolamine  1 patch Transdermal Q72H  . sodium chloride  3 mL Intravenous Q12H   Infusions:  . heparin 1,550 Units/hr (08/15/14 1305)  . sodium chloride 0.9 % 1,000 mL with potassium  chloride 40 mEq infusion 75 mL/hr at 08/15/14 1512    Assessment: 81yo male was transferred from inpt rehab to SDU for acute respiratory failure and sepsis, now w/ rising troponin, to begin heparin.  Follow-up HL is therapeutic at 0.39 on heparin 1550 units/hr. No bleeding noted.  Goal of Therapy:  Heparin level 0.3-0.7 units/ml Monitor platelets by anticoagulation protocol: Yes   Plan:  Continue heparin 1550 units/hr Daily HL/CBC Monitor s/sx of bleeding  Arlean Hopping. Newman Pies, PharmD Clinical Pharmacist Pager [phone] 08/15/2014,8:49 PM

## 2014-08-15 NOTE — Progress Notes (Signed)
Performed patient's CPT and gave Mucomyst treatment.  Was able to make patient cough up a small amount of yellow, tan sputum that was sent down to main lab for sputum specimen.  Patient was placed on 5L nasal cannula sats currently 93%.  RT will continue to monitor.

## 2014-08-15 NOTE — Progress Notes (Signed)
Pt has elevated troponin CallahanNP notified. Orders given will continue to monitor pt.

## 2014-08-15 NOTE — Progress Notes (Signed)
ANTICOAGULATION & ANTIBIOTIC CONSULT NOTE - Follow Up Consult  Pharmacy Consult for Heparin, Vancomycin, Primaxin Indication: CP/ACS, Sepsis/PNA  Allergies  Allergen Reactions  . Ace Inhibitors Cough  . Penicillins Rash    Patient Measurements: Height: 5\' 7"  (170.2 cm) Weight: 215 lb 2.7 oz (97.6 kg) IBW/kg (Calculated) : 66.1 Heparin Dosing Weight: 87 kg  Vital Signs: Temp: 98.1 F (36.7 C) (01/30 0333) Temp Source: Axillary (01/30 0333) BP: 107/61 mmHg (01/30 0333) Pulse Rate: 102 (01/30 0333)  Labs:  Recent Labs  08/14/14 0750 08/14/14 1454 08/14/14 1955 08/15/14 0235  HGB 9.7*  --   --  8.5*  HCT 30.6*  --   --  27.2*  PLT 251  --   --  227  CREATININE 0.63  --   --  0.63  TROPONINI  --  0.06* 0.39* 0.11*    Estimated Creatinine Clearance: 71 mL/min (by C-G formula based on Cr of 0.63).   Medications:  Scheduled:  . aspirin  150 mg Rectal Daily  . budesonide (PULMICORT) nebulizer solution  0.25 mg Nebulization BID  . imipenem-cilastatin  500 mg Intravenous 3 times per day  . insulin aspart  0-9 Units Subcutaneous 6 times per day  . ketotifen  1 drop Both Eyes BID  . levalbuterol  0.63 mg Nebulization Q6H  . levETIRAcetam  250 mg Intravenous Q12H  . methylPREDNISolone (SOLU-MEDROL) injection  40 mg Intravenous Q12H  . metoprolol  2.5 mg Intravenous Q6H  . scopolamine  1 patch Transdermal Q72H  . sodium chloride  3 mL Intravenous Q12H   Infusions:  . heparin 1,200 Units/hr (08/15/14 0305)  . sodium chloride 0.9 % 1,000 mL with potassium chloride 40 mEq infusion 75 mL/hr at 08/14/14 2140    Assessment:  81yo male w/ PMH of TVAR, vertigo, peripheral artery disease, and recurrent aspiration was transferred from inpt rehab to SDU for acute lethargy and hypoxia 2/2 to ARF 2/2 to PNA.  Now w/ rising troponin, Pharmacy to begin heparin for ACS.  Admitted from rehab with resp failure +/- sepsis.    Anticoagulation: ACS - Heparin.  Heparin level today is  subtherapeutic at < 0.10.  No reports of bleeding at this time.    Cardiovascular: Patient in Afib now, BP variable, wnl. HR up. MAP > 70.  CE up x 3. ASA, metoprolol  Nephrology: CrCl ~70-75 mL/min,  K 3.2 >> 3.6 improved.    Hematology / Oncology: Anemia - hgb 9.7 >> 8.5, plts wnl  Goal of Therapy:  Heparin level 0.3-0.7 units/ml Monitor platelets by anticoagulation protocol: Yes   Plan: Note due 2/1 (Vanc/primaxin) - Heparin 3000 units IV bolus x1 - Increase heparin infusion to 1550 units/hr - slightly more aggressive as patient now in Afib to get to therapeutic level faster. - Heparin level 8 hours after changing infusion rate - Daily CBC and Heparin levels - Monitor for signs and symptoms of bleeding  Red ChristiansSamson Nour Scalise, Pharm. D. Clinical Pharmacy Resident Pager: [phone] Ph: [phone] 08/15/2014 9:17 AM

## 2014-08-15 NOTE — Progress Notes (Signed)
Nutrition Brief Note  Patient identified on the Malnutrition Screening Tool (MST) Report  Per chart review family has opted to not have feeding tube now or in future. Plan to transition to comfort care with Hospice.   Wt Readings from Last 15 Encounters:  08/15/14 215 lb 2.7 oz (97.6 kg)  08/12/14 174 lb 2.6 oz (79 kg)  07/08/14 185 lb 13.6 oz (84.3 kg)  04/30/14 197 lb 8 oz (89.585 kg)    Body mass index is 33.69 kg/(m^2). Patient meets criteria for obesity class I based on current BMI.   Current diet order is NPO. Labs and medications reviewed.   No nutrition interventions warranted at this time. If nutrition issues arise, please consult RD.   Kendell BaneHeather Jourdon Zimmerle RD, LDN, CNSC [phone] Pager [phone] After Hours Pager

## 2014-08-16 DIAGNOSIS — Z66 Do not resuscitate: Secondary | ICD-10-CM

## 2014-08-16 DIAGNOSIS — Z515 Encounter for palliative care: Secondary | ICD-10-CM

## 2014-08-16 LAB — GLUCOSE, CAPILLARY
GLUCOSE-CAPILLARY: 141 mg/dL — AB (ref 70–99)
Glucose-Capillary: 114 mg/dL — ABNORMAL HIGH (ref 70–99)
Glucose-Capillary: 122 mg/dL — ABNORMAL HIGH (ref 70–99)

## 2014-08-16 LAB — CBC WITH DIFFERENTIAL/PLATELET
Basophils Absolute: 0 10*3/uL (ref 0.0–0.1)
Basophils Relative: 0 % (ref 0–1)
EOS ABS: 0 10*3/uL (ref 0.0–0.7)
Eosinophils Relative: 0 % (ref 0–5)
HEMATOCRIT: 27.4 % — AB (ref 39.0–52.0)
Hemoglobin: 8.6 g/dL — ABNORMAL LOW (ref 13.0–17.0)
Lymphocytes Relative: 7 % — ABNORMAL LOW (ref 12–46)
Lymphs Abs: 1.2 10*3/uL (ref 0.7–4.0)
MCH: 26 pg (ref 26.0–34.0)
MCHC: 31.4 g/dL (ref 30.0–36.0)
MCV: 82.8 fL (ref 78.0–100.0)
MONO ABS: 0.5 10*3/uL (ref 0.1–1.0)
Monocytes Relative: 3 % (ref 3–12)
Neutro Abs: 15.2 10*3/uL — ABNORMAL HIGH (ref 1.7–7.7)
Neutrophils Relative %: 90 % — ABNORMAL HIGH (ref 43–77)
Platelets: 285 10*3/uL (ref 150–400)
RBC: 3.31 MIL/uL — ABNORMAL LOW (ref 4.22–5.81)
RDW: 17.7 % — AB (ref 11.5–15.5)
WBC: 16.9 10*3/uL — AB (ref 4.0–10.5)

## 2014-08-16 LAB — COMPREHENSIVE METABOLIC PANEL
ALK PHOS: 73 U/L (ref 39–117)
ALT: 17 U/L (ref 0–53)
ANION GAP: 8 (ref 5–15)
AST: 22 U/L (ref 0–37)
Albumin: 1.4 g/dL — ABNORMAL LOW (ref 3.5–5.2)
BUN: 38 mg/dL — AB (ref 6–23)
CO2: 26 mmol/L (ref 19–32)
Calcium: 8.5 mg/dL (ref 8.4–10.5)
Chloride: 111 mmol/L (ref 96–112)
Creatinine, Ser: 0.77 mg/dL (ref 0.50–1.35)
GFR calc Af Amer: >90 mL/min (ref >90)
GFR, EST NON AFRICAN AMERICAN: 79 mL/min — AB (ref >90)
Glucose, Bld: 142 mg/dL — ABNORMAL HIGH (ref 70–99)
Potassium: 4.2 mmol/L (ref 3.5–5.1)
SODIUM: 145 mmol/L (ref 135–145)
Total Bilirubin: 0.9 mg/dL (ref 0.3–1.2)
Total Protein: 5.3 g/dL — ABNORMAL LOW (ref 6.0–8.3)

## 2014-08-16 LAB — TROPONIN I
Troponin I: 0.11 ng/mL — ABNORMAL HIGH (ref <0.031)
Troponin I: 0.07 ng/mL — ABNORMAL HIGH (ref <0.031)

## 2014-08-16 LAB — MAGNESIUM: Magnesium: 1.8 mg/dL (ref 1.5–2.5)

## 2014-08-16 LAB — HEPARIN LEVEL (UNFRACTIONATED): Heparin Unfractionated: 0.5 IU/mL (ref 0.30–0.70)

## 2014-08-16 MED ORDER — VANCOMYCIN HCL IN DEXTROSE 1-5 GM/200ML-% IV SOLN
1000.0000 mg | Freq: Once | INTRAVENOUS | Status: DC
  Filled 2014-08-16: qty 200

## 2014-08-16 MED ORDER — SODIUM CHLORIDE 0.9 % IN NEBU
3.0000 mL | INHALATION_SOLUTION | RESPIRATORY_TRACT | Status: DC | PRN
  Filled 2014-08-16: qty 3

## 2014-08-16 MED ORDER — LORAZEPAM 2 MG/ML PO CONC
1.0000 mg | Freq: Four times a day (QID) | ORAL | Status: DC
  Administered 2014-08-17 (×2): 1 mg via ORAL
  Filled 2014-08-16 (×3): qty 1

## 2014-08-16 MED ORDER — SALINE SPRAY 0.65 % NA SOLN
1.0000 | Freq: Four times a day (QID) | NASAL | Status: DC
  Filled 2014-08-16: qty 44

## 2014-08-16 MED ORDER — BISACODYL 10 MG RE SUPP: 10.0000 mg | Freq: Every day | RECTAL | Status: DC | PRN

## 2014-08-16 MED ORDER — IPRATROPIUM-ALBUTEROL 0.5-2.5 (3) MG/3ML IN SOLN
3.0000 mL | Freq: Four times a day (QID) | RESPIRATORY_TRACT | Status: DC
  Administered 2014-08-16 – 2014-08-17 (×3): 3 mL via RESPIRATORY_TRACT
  Filled 2014-08-16 (×4): qty 3

## 2014-08-16 MED ORDER — METOPROLOL TARTRATE 1 MG/ML IV SOLN
5.0000 mg | INTRAVENOUS | Status: DC | PRN
  Filled 2014-08-16: qty 5

## 2014-08-16 MED ORDER — MORPHINE SULFATE (CONCENTRATE) 10 MG/0.5ML PO SOLN
10.0000 mg | Freq: Four times a day (QID) | ORAL | Status: DC
  Administered 2014-08-17 (×3): 10 mg via SUBLINGUAL
  Filled 2014-08-16 (×4): qty 0.5

## 2014-08-16 MED ORDER — AYR SALINE NASAL NA GEL
1.0000 "application " | Freq: Four times a day (QID) | NASAL | Status: DC
  Filled 2014-08-16: qty 14.1

## 2014-08-16 MED ORDER — DEXAMETHASONE 1 MG/ML PO CONC
2.0000 mg | Freq: Two times a day (BID) | ORAL | Status: DC
  Administered 2014-08-17: 2 mg via ORAL
  Filled 2014-08-16 (×4): qty 2

## 2014-08-16 MED ORDER — MORPHINE SULFATE 4 MG/ML IJ SOLN
4.0000 mg | INTRAMUSCULAR | Status: DC | PRN
  Administered 2014-08-17 (×2): 4 mg via INTRAVENOUS
  Filled 2014-08-16 (×2): qty 1

## 2014-08-16 MED ORDER — LORAZEPAM 2 MG/ML IJ SOLN
1.0000 mg | Freq: Four times a day (QID) | INTRAMUSCULAR | Status: DC | PRN
  Administered 2014-08-17 (×2): 1 mg via INTRAVENOUS
  Filled 2014-08-16 (×2): qty 1

## 2014-08-16 MED ORDER — VANCOMYCIN HCL 10 G IV SOLR
1250.0000 mg | Freq: Two times a day (BID) | INTRAVENOUS | Status: DC
  Filled 2014-08-16 (×2): qty 1250

## 2014-08-16 MED ORDER — MORPHINE SULFATE 2 MG/ML IJ SOLN
1.0000 mg | INTRAMUSCULAR | Status: DC | PRN
  Administered 2014-08-16: 2 mg via INTRAVENOUS

## 2014-08-16 NOTE — Progress Notes (Signed)
Report called to Betty Jo RN on 6N.  

## 2014-08-16 NOTE — Progress Notes (Signed)
[facility] TEAM 1 - Stepdown/ICU TEAM Progress Note  Roy Mitchell MWU:[id number] DOB: 1924/03/10 DOA: 08/14/2014 PCP: Roy Mitchell  Admit HPI / Brief Narrative: Roy Mitchell is a 81 y.o. WM PMHx anxiety disorder TVAR, vertigo, peripheral artery disease, Recurrent Aspiration, pulmonary nodules, multinodular goiter. He was most recently admitted to [facility] in December 2015 with a left parietal subdural hematoma after a fall. He underwent craniotomy on December 11. His postop course was complicated by hypoxia and total right lung collapse which necessitated bronchoscopy to remove mucous plugging. Further, he developed severe dysphagia and was kept NPO. After he stabilized he was transferred to [facility]. After further improvement he was transferred to [facility] inpatient rehabilitation on January 12. Since being in rehabilitation he has had difficulty with secretions and oral pharyngeal dysphagia. He has been followed by SLP and most recently on a D3 diet with nectar thick. On the morning of August 14, 2014 he was found to be lethargic and hypoxic with an oxygen sat of 84%. Inpatient rehabilitation consulted [facility] who recommended starting IV antibiotics, transferring Roy Mitchell to an inpatient stepdown unit and making him NPO. The patient was made DNR, DNI.   HPI/Subjective: 1/31 opens his eyes to stimulation, moans and utters  incomprehensible words. Does not follow commands.   Assessment/Plan: Acute hypoxic respiratory failure with Sepsis -Titrate O2 to maintain SPO2 > 93%  -After family meeting with Dr. Julaine Mitchell (palliative care) family has decided to make patient comfort care  Aspiration Pneumonia -All medications discontinued per family wishes except for patient comfort care measures  SDH s/p craniotomy with residual dysphagia and deconditioning - patient comfort care measures  Hypokalemia (mild) -patient comfort care measures   Anxiety  Disorder -patient comfort care measures.  HTN -patient comfort care measures  Elevated troponin -patient comfort care measures  Hyperglycemia. -patient comfort care measures  Anemia of critical illness/chronic disease -patient comfort care measures   Code Status: FULL Family Communication: family present at time of exam Disposition Plan: SNF vs home health vs Comfort care; dependent upon patient's respiratory treatment    Consultants: Dr. Gershon Mitchell (PCCM)   Procedure/Significant Events: 1/29 PCXR;-changes consistent with AV repair. -patchy infiltrative change Rt > Lt  1/30 CT head without contrast;5 mm residual chronic LEFT subdural hematoma. -Slight mass effect on the subjacent sulci without midline shift.  -Resolution of parafalcine subdural hematoma. -Stable moderate to severe global parenchymal brain volume loss.   Culture 1/29 MRSA by PCR positive 1/30 sputum pending 1/30 respiratory virus panel pending 1/30 influenza panel pending 1/30 blood pending 1/30 urine pending  Antibiotics: Primaxin 1/29>> Vancomycin 1/29>>  DVT prophylaxis: Heparin   Devices    LINES / TUBES:      Continuous Infusions:    Objective: VITAL SIGNS: Temp: 98.8 F (37.1 C) (01/31 1245) Temp Source: Axillary (01/31 1245) BP: 147/79 mmHg (01/31 1245) Pulse Rate: 111 (01/31 1245) SPO2; FIO2:   Intake/Output Summary (Last 24 hours) at 08/16/14 1518 Last data filed at 08/16/14 0900  Gross per 24 hour  Intake 1682.5 ml  Output    300 ml  Net 1382.5 ml     Exam: General: opens his eyes to stimulation, moans and utters  incomprehensible words. Does not follow commands Lungs: diffuse rhonchi  Rt > Lt, without wheezes or crackles Cardiovascular: Tachycardic, Regular rhythm without murmur gallop or rub normal S1 and S2 Abdomen: Nontender, nondistended, soft, bowel sounds positive, no rebound, no ascites, no appreciable mass Extremities: No significant cyanosis,  clubbing, or edema bilateral lower extremities  Data Reviewed: Basic Metabolic Panel:  Recent Labs Lab 08/14/14 0750 08/15/14 0235 08/16/14 0125  NA 143 143 145  K 3.2* 3.6 4.2  CL 103 107 111  CO2 32 28 26  GLUCOSE 117* 150* 142*  BUN 27* 29* 38*  CREATININE 0.63 0.63 0.77  CALCIUM 9.0 8.5 8.5  MG  --   --  1.8   Liver Function Tests:  Recent Labs Lab 08/16/14 0125  AST 22  ALT 17  ALKPHOS 73  BILITOT 0.9  PROT 5.3*  ALBUMIN 1.4*   No results for input(s): LIPASE, AMYLASE in the last 168 hours. No results for input(s): AMMONIA in the last 168 hours. CBC:  Recent Labs Lab 08/14/14 0750 08/15/14 0235 08/16/14 0125  WBC 15.5* 15.9* 16.9*  NEUTROABS  --   --  15.2*  HGB 9.7* 8.5* 8.6*  HCT 30.6* 27.2* 27.4*  MCV 82.0 84.0 82.8  PLT 251 227 285   Cardiac Enzymes:  Recent Labs Lab 08/15/14 0235 08/15/14 0915 08/15/14 1935 08/16/14 0125 08/16/14 0645  TROPONINI 0.11* 0.30* 0.06* 0.11* 0.07*   BNP (last 3 results) No results for input(s): PROBNP in the last 8760 hours. CBG:  Recent Labs Lab 08/15/14 1641 08/15/14 2038 08/16/14 0003 08/16/14 0456 08/16/14 0736  GLUCAP 125* 131* 141* 114* 122*    Recent Results (from the past 240 hour(s))  Culture, Urine     Status: None   Collection Time: 08/07/14  3:39 PM  Result Value Ref Range Status   Specimen Description URINE, CLEAN CATCH  Final   Special Requests NONE  Final   Colony Count   Final    4,000 COLONIES/ML Performed at [facility]    Culture   Final    INSIGNIFICANT GROWTH Performed at [facility]    Report Status 08/08/2014 FINAL  Final  MRSA PCR Screening     Status: Abnormal   Collection Time: 08/14/14 12:39 PM  Result Value Ref Range Status   MRSA by PCR POSITIVE (A) NEGATIVE Final    Comment:        The GeneXpert MRSA Assay (FDA approved for NASAL specimens only), is one component of a comprehensive MRSA colonization surveillance program. It is  not intended to diagnose MRSA infection nor to guide or monitor treatment for MRSA infections. RESULT CALLED TO, READ BACK BY AND VERIFIED WITH: Roy DikeM. VAUGHN RN 16:00 08/14/14 ([email or web address])   Culture, expectorated sputum-assessment     Status: None   Collection Time: 08/15/14  2:00 PM  Result Value Ref Range Status   Specimen Description SPUTUM  Final   Special Requests NONE  Final   Sputum evaluation   Final    THIS SPECIMEN IS ACCEPTABLE. RESPIRATORY CULTURE REPORT TO FOLLOW.   Report Status 08/15/2014 FINAL  Final  Culture, respiratory (NON-Expectorated)     Status: None (Preliminary result)   Collection Time: 08/15/14  2:00 PM  Result Value Ref Range Status   Specimen Description SPUTUM  Final   Special Requests NONE  Final   Gram Stain PENDING  Incomplete   Culture   Final    Culture reincubated for better growth Performed at [facility]    Report Status PENDING  Incomplete     Studies:  Recent x-ray studies have been reviewed in detail by the Attending Physician  Scheduled Meds:  Scheduled Meds: . budesonide (PULMICORT) nebulizer solution  0.25 mg Nebulization BID  . Chlorhexidine Gluconate Cloth  6  each Topical [id number]  . dexamethasone  2 mg Oral Q12H  . ipratropium-albuterol  3 mL Nebulization Q6H  . ketotifen  1 drop Both Eyes BID  . LORazepam  1 mg Oral 4 times per day  . morphine CONCENTRATE  10 mg Sublingual 4 times per day  . mupirocin ointment  1 application Nasal BID  . sodium chloride  1 spray Each Nare QID  . sodium chloride  3 mL Intravenous Q12H    Time spent on care of this patient: 40 mins   [facility]  [facility] Office  [phone] Pager - [phone]  On-Call/Text Page:      [email or web address]      password [email or web address]  If 7PM-7AM, please contact night-coverage [email or web address] Password [email or web address] 08/16/2014, 3:18 PM   LOS: 2 days   Care during the described time interval was provided by me .  I have reviewed this patient's  available data, including medical history, events of note, physical examination, radiology studies and test results as part of my evaluation  Carolyne Littles, MD [phone] Pager

## 2014-08-16 NOTE — Progress Notes (Signed)
Pt transferred to 6N. Family at bedside and aware of pt transfer.

## 2014-08-16 NOTE — Progress Notes (Signed)
Attempted to call report

## 2014-08-16 NOTE — Progress Notes (Signed)
Patient tolerated CPT for a total time of 8 minutes.  Patient began to say he was hurting and crying as though he was in pain.

## 2014-08-16 NOTE — Consult Note (Signed)
[facility]  Date: 08/16/2014   Patient Name: Roy Mitchell  DOB: 05-21-1924  MRN: [id number]  Age / Sex: 81 y.o., male   PCP: Lonie PeakNathan Conroy, PA-C Referring Physician: Drema Dallasurtis J Woods, MD  Active Problems: Principal Problem:   Acute respiratory failure with hypoxia Active Problems:   Traumatic subdural hematoma with loss of consciousness of 6 hours to 24 hours   HTN (hypertension)   Anxiety and depression   DNR (do not resuscitate)   Dysphagia, pharyngoesophageal phase   Aspiration pneumonia   Sepsis   Aspiration pneumonia due to food (regurgitated)   Subdural hematoma   Elevated troponin   Hyperglycemia   Anemia of chronic disease   HPI/Reason for Consultation: [name] is a 81 y.o. male with multiple end stage medical problems including recurrent aspiration PNA, s/p traumatic SDH, and end stage CAD, and COPD-now with severe PNA- he has had a very long complicated decline-family now requesting comfort measures. PMT consulted to assist with symptom management and hospice options.  Participants in Discussion: HCPOA: yes   Advance Directive:    Code Status Orders        Start     Ordered   08/14/14 1335  Do not attempt resuscitation (DNR)   Continuous    Question Answer Comment  In the event of cardiac or respiratory ARREST Do not call a "code blue"   In the event of cardiac or respiratory ARREST Do not perform Intubation, CPR, defibrillation or ACLS   In the event of cardiac or respiratory ARREST Use medication by any route, position, wound care, and other measures to relive pain and suffering. May use oxygen, suction and manual treatment of airway obstruction as needed for comfort.      08/14/14 1335       @ADVDIR @  I have reviewed the medical record, interviewed the patient and family, and examined the patient. The following aspects are pertinent.  Past Medical History  Diagnosis Date  . Hypercholesterolemia   . Benign  prostatic hypertrophy   . GERD (gastroesophageal reflux disease)   . DDD (degenerative disc disease), cervical   . Tinnitus   . Multinodular goiter   . Prediabetes   . DDD (degenerative disc disease), lumbar   . Hypertension   . Pulmonary nodules   . Anxiety disorder    History   Social History  . Marital Status: Married    Spouse Name: Santina EvansCatherine    Number of Children: 5  . Years of Education: 12   Occupational History  .      retired   Social History Main Topics  . Smoking status: Former Games developermoker  . Smokeless tobacco: Never Used     Comment: QUIT IN 1970  . Alcohol Use: No  . Drug Use: No  . Sexual Activity: None   Other Topics Concern  . None   Social History Narrative   Patient consumes caffiene(candy)   Family History  Problem Relation Age of Onset  . Congestive Heart Failure Mother    Scheduled Meds: . budesonide (PULMICORT) nebulizer solution  0.25 mg Nebulization BID  . Chlorhexidine Gluconate Cloth  6 each Topical Q0600  . dexamethasone  2 mg Oral Q12H  . ipratropium-albuterol  3 mL Nebulization Q6H  . ketotifen  1 drop Both Eyes BID  . LORazepam  1 mg Oral 4 times per day  . morphine CONCENTRATE  10 mg Sublingual 4 times per day  . mupirocin ointment  1 application Nasal  BID  . saline  1 application Nasal 4 times per day  . sodium chloride  3 mL Intravenous Q12H   Continuous Infusions:  PRN Meds:.acetaminophen **OR** acetaminophen, bisacodyl, LORazepam, morphine injection, sodium chloride Allergies  Allergen Reactions  . Ace Inhibitors Cough  . Penicillins Rash   CBC:    Component Value Date/Time   WBC 16.9* 08/16/2014 0125   HGB 8.6* 08/16/2014 0125   HCT 27.4* 08/16/2014 0125   PLT 285 08/16/2014 0125   MCV 82.8 08/16/2014 0125   NEUTROABS 15.2* 08/16/2014 0125   LYMPHSABS 1.2 08/16/2014 0125   MONOABS 0.5 08/16/2014 0125   EOSABS 0.0 08/16/2014 0125   BASOSABS 0.0 08/16/2014 0125   Comprehensive Metabolic Panel:    Component Value  Date/Time   NA 145 08/16/2014 0125   K 4.2 08/16/2014 0125   CL 111 08/16/2014 0125   CO2 26 08/16/2014 0125   BUN 38* 08/16/2014 0125   CREATININE 0.77 08/16/2014 0125   GLUCOSE 142* 08/16/2014 0125   CALCIUM 8.5 08/16/2014 0125   AST 22 08/16/2014 0125   ALT 17 08/16/2014 0125   ALKPHOS 73 08/16/2014 0125   BILITOT 0.9 08/16/2014 0125   PROT 5.3* 08/16/2014 0125   ALBUMIN 1.4* 08/16/2014 0125    Vital Signs: BP 138/97 mmHg  Pulse 117  Temp(Src) 98.8 F (37.1 C) (Axillary)  Resp 26  Ht  (1.702 m)  Wt 94.7 kg (208 lb 12.4 oz)  BMI 32.69 kg/m2  SpO2 98% Filed Weights   08/14/14 1500 08/15/14 0348 08/16/14 0500  Weight: 94.8 kg (208 lb 15.9 oz) 97.6 kg (215 lb 2.7 oz) 94.7 kg (208 lb 12.4 oz)   01/30 0701 - 01/31 0700 In: 2145.8 [I.V.:1945.8; IV Piggyback:200] Out: 400 [Urine:400]  Physical Exam:  Chronically ill appearing, very dry oral mucosa-thick respiratory secretions. +agitated, tearful moaning. Garbled speech.Family at bedside.  Summary of Established Goals of Care and Medical Treatment Preferences Family acknowledge his very long and difficult road-they feel they have given him every best chance to get better but he continues to decline and is suffering. They would like to take him home with Hospice care- to [facility].    Primary Diagnoses  Chronic Left Subdural hematoma Status post LEFT frontoparietal craniotomy Functional status decline Persistent dysphagia with aspiration PNA  Active Symptoms: 1. Pain- crying and teaful with CPT 2. Dyspnea- PNA and COPD 3. Agitation/confusion  Psycho-social/Spiritual:  Very supportive family, wife extremely reasonable, daughter in [place] who is an NP and a daughter in [place].  Prognosis: <2 weeks   Palliative Performance Scale:   Recommendations: PPS: 20  1. DNR 2. Comfort Feeding AS TOLERATED 3. Nebs, mucous management- stopped scopolamine with is thickening his secretions and making them more  difficult to expectorate. Started q4 duo nebs and q2 prn Saline nebs. Scheduled Roxanol and and scheduled Lorazepam, stopped keppra, stopped heparin, stopped antibiotics. 4. Refer to [facility]    Time Out: 9:30-10:40  Time Total: 70 minutes Greater than 50%  of this time was spent counseling and coordinating care related to the above assessment and plan.  Signed by: Hilbert Odor, DO  08/16/2014, 11:25 AM  Please contact Palliative Medicine Team phone at [phone] for questions and concerns.

## 2014-08-16 NOTE — Progress Notes (Signed)
Utilization review completed.  

## 2014-08-17 LAB — URINE CULTURE
Colony Count: NO GROWTH
Culture: NO GROWTH

## 2014-08-17 MED ORDER — LORAZEPAM 2 MG/ML PO CONC: 1.0000 mg | Freq: Four times a day (QID) | ORAL | Status: AC

## 2014-08-17 MED ORDER — DEXAMETHASONE 1 MG/ML PO CONC: 2.0000 mg | Freq: Two times a day (BID) | ORAL | Status: AC

## 2014-08-17 MED ORDER — MORPHINE SULFATE (CONCENTRATE) 10 MG/0.5ML PO SOLN: 10.0000 mg | Freq: Four times a day (QID) | ORAL | Status: AC

## 2014-08-17 MED ORDER — SCOPOLAMINE 1 MG/3DAYS TD PT72: 1.0000 | MEDICATED_PATCH | TRANSDERMAL | Status: AC

## 2014-08-17 MED ORDER — BISACODYL 10 MG RE SUPP: 10.0000 mg | Freq: Every day | RECTAL | Status: AC | PRN

## 2014-08-17 NOTE — Care Management Note (Addendum)
  Page 1 of 1   08/17/2014     10:52:06 AM CARE MANAGEMENT NOTE 08/17/2014  Patient:  Roy Mitchell,Roy Mitchell   Account Number:  [id number]  Date Initiated:  08/17/2014  Documentation initiated by:  Ronny FlurryWILE,Blakelyn Dinges  Subjective/Objective Assessment:     Action/Plan:   Anticipated DC Date:  08/17/2014   Anticipated DC Plan:  HOME W HOSPICE CARE         Choice offered to / List presented to:  C-3 Spouse           Status of service:   Medicare Important Message given?  YES (If response is "NO", the following Medicare IM given date fields will be blank) Date Medicare IM given:  08/17/2014 Medicare IM given by:  Ronny FlurryWILE,Petina Muraski Date Additional Medicare IM given:   Additional Medicare IM given by:    Discharge Disposition:  HOME W HOSPICE CARE  Per UR Regulation:  Reviewed for med. necessity/level of care/duration of stay  If discussed at Long Length of Stay Meetings, dates discussed:    Comments:    08-17-14 [facility] patient has just left via ambulance. Ronny FlurryHeather Jaquel Glassburn RN BSN  08-17-14 Wife and daughter at bedside  [name] [phone] , wife [phone] , confirmed face sheet , they would like  [facility] phone [phone] fax [phone] . [facility] Marcelino [facility]usterMichelle [facility]. Patient has home oxygen at [facility] through [facility] and hospital bed already. Patient for discharge today . SW consulted for ambulance transportation home. Ronny FlurryHeather Caron Ode RN BSN

## 2014-08-17 NOTE — Discharge Summary (Signed)
Physician Discharge Summary  HONG MORING ZOX:[id number] DOB: Nov 28, 1923 DOA: 08/14/2014  PCP: Arlyss Queen  Admit date: 08/14/2014 Discharge date: 08/17/2014  Time spent: 35  minutes  Recommendations for Outpatient Follow-up:  1. D/c home with home hospice.   Discharge Diagnoses:  Principal Problem:   Acute respiratory failure with hypoxia   Active Problems:   Sepsis   HTN (hypertension)   Anxiety and depression   DNR (do not resuscitate)   Dysphagia, pharyngoesophageal phase   Aspiration pneumonia   Subdural hematoma   Elevated troponin   Hyperglycemia   Anemia of chronic disease   Discharge Condition: guarded  Diet recommendation: comfort feeds  Filed Weights   08/14/14 1500 08/15/14 0348 08/16/14 0500  Weight: 94.8 kg (208 lb 15.9 oz) 97.6 kg (215 lb 2.7 oz) 94.7 kg (208 lb 12.4 oz)    History of present illness:  81 year old male with history of peripheral artery disease, aortic valve replacement, recurrent aspiration, pulmonary nodules who was admitted to [facility] in December 2015 with a left parietal subdural hematoma after a fall. He underwent craniotomy on 06/26/2014 with postop course complicated by hypoxia and total right lung collapse requiring bronchoscopy and removal of mucous plug. Further he didn't look severe dysphagia and was kept nothing by mouth. After being stabilized he was transferred to [facility] and then with some improvement was transferred to on inpatient rehabilitation on 07/28/2014. While in the rehabilitation he had continuous difficulty with oropharyngeal dysphagia and ongoing secretions. Patient was advanced to dysphagia level III diet with nectar thick as per swallow evaluation. Patient has not been mobile since hospitalization. On the morning of 08/14/2014 he was found to be lethargic and increasingly hypoxic with O2 sat dropped to 84%. Pulmonary consult was called who recommended starting him on IV antibiotics. Hospitalist  consulted and patient transferred to stepdown unit and made him nothing by mouth. Patient showed progressive decline in symptoms with persistent respiratory failure and worsened encephalopathy. After discussion with his wife and daughter patient made comfort care.  Hospital Course:  Acute hypoxic respiratory failure with sepsis Secondary to aspiration pneumonia. Initially placed on empiric antibiotic. Now patient made full comfort plan on discharge home with home hospice. -Prognosis is guarded. -Will discharge on as needed morphine concentrated solution for pain and dyspnea, as needed Ativan for anxiety, home oxygen and scopolamine patch to reduce oral secretions.    Aspiration pneumonia All medications have been discontinued as per family request. Now off antibiotics.  Subdural hematoma status post craniotomy with residual dysphagia and severe deconditioning Patient now made comfort care  Patient has guarded prognosis, nonverbal with moaning, encephalopathic and poorly responsive. Family history on taking patient home with home hospice. Appreciate palliative care discussions for goals of care.  Procedures:  None this admission  Consultations:  Pulmonary  Palliative care  Family communication: Wife and daughter at bedside  CODE STATUS: DO NOT RESUSCITATE, comfort measures  Diet: Comfort feeds if tolerated  Discharge Exam: Filed Vitals:   08/17/14 0620  BP: 168/92  Pulse: 103  Temp: 98.8 F (37.1 C)  Resp: 20    General: Elderly male lying in bed nonresponsive to verbal commands, moaning HEENT: Dry oral mucosa, supple neck Chest: Coarse breath sounds bilaterally, no rhonchi or wheeze CVS: Normal S1-S2, no murmurs rubs or gallop GI: Soft, nondistended, nontender, bowel sounds present Skeletal: Warm, no edema CNS: Encephalopatic,   Discharge Instructions    Current Discharge Medication List    START taking these medications  Details  bisacodyl (DULCOLAX)  10 MG suppository Place 1 suppository (10 mg total) rectally daily as needed for mild constipation. Qty: 12 suppository, Refills: 0    dexamethasone (DECADRON) 1 MG/ML solution Take 2 mLs (2 mg total) by mouth every 12 (twelve) hours. Qty: 30 mL, Refills: 0    LORazepam (ATIVAN) 2 MG/ML concentrated solution Take 0.5 mLs (1 mg total) by mouth every 6 (six) hours. Qty: 30 mL, Refills: 0    Morphine Sulfate (MORPHINE CONCENTRATE) 10 MG/0.5ML SOLN concentrated solution Place 0.5 mLs (10 mg total) under the tongue every 6 (six) hours. Qty: 30 mL, Refills: 0    scopolamine (TRANSDERM-SCOP) 1 MG/3DAYS Place 1 patch (1.5 mg total) onto the skin every 3 (three) days. Qty: 10 patch, Refills: 0      STOP taking these medications     ADVAIR DISKUS 250-50 MCG/DOSE AEPB      albuterol (PROVENTIL HFA;VENTOLIN HFA) 108 (90 BASE) MCG/ACT inhaler      albuterol (PROVENTIL) (2.5 MG/3ML) 0.083% nebulizer solution      Calcium Carb-Cholecalciferol 600-200 MG-UNIT TABS      escitalopram (LEXAPRO) 5 MG tablet      finasteride (PROSCAR) 5 MG tablet      hydroxypropyl methylcellulose / hypromellose (ISOPTO TEARS / GONIOVISC) 2.5 % ophthalmic solution      ibuprofen (ADVIL,MOTRIN) 200 MG tablet      LORazepam (ATIVAN) 2 MG tablet      Multiple Vitamins-Minerals (MULTIVITAMIN ADULTS 50+) TABS      pantoprazole (PROTONIX) 40 MG tablet      tamsulosin (FLOMAX) 0.4 MG CAPS capsule      Vitamins-Lipotropics (LIPO-FLAVONOID PLUS) TABS      levETIRAcetam 250 mg in sodium chloride 0.9 % 100 mL        Allergies  Allergen Reactions  . Ace Inhibitors Cough  . Penicillins Rash   Follow-up Information    Please follow up.   Why:  d/c home with home hospice       The results of significant diagnostics from this hospitalization (including imaging, microbiology, ancillary and laboratory) are listed below for reference.    Significant Diagnostic Studies: Dg Chest 2 View  08/13/2014   CLINICAL  DATA:  Shortness of breath, weakness, cough, GERD, hypertension  EXAM: CHEST  2 VIEW  COMPARISON:  08/07/2014  FINDINGS: Enlargement of cardiac silhouette post AVR.  Atherosclerotic calcification aorta.  Mediastinal contours and pulmonary vascularity otherwise normal.  Increased bibasilar opacities question atelectasis versus infiltrate.  Upper lungs clear.  Tiny BILATERAL pleural effusions.  No pneumothorax.  Bones demineralized.  IMPRESSION: Increased bibasilar atelectasis versus infiltrate.   Electronically Signed   By: Ulyses Southward M.D.   On: 08/13/2014 09:10   Dg Chest 2 View  08/07/2014   CLINICAL DATA:  Fall, subdural hematoma, cough, history hypertension, hypercholesterolemia, former smoker  EXAM: CHEST  2 VIEW  COMPARISON:  07/30/2014  FINDINGS: Borderline enlargement of cardiac silhouette post aortic valve replacement.  Calcified thoracic aorta.  Mediastinal contours and pulmonary vascularity otherwise normal.  Emphysematous changes and bronchitic changes consistent with COPD.  Bibasilar atelectasis.  Lungs otherwise clear.  No pleural effusion or pneumothorax.  Bones demineralized.  IMPRESSION: COPD changes with bibasilar atelectasis.   Electronically Signed   By: Ulyses Southward M.D.   On: 08/07/2014 10:40   Dg Chest 2 View  07/30/2014   CLINICAL DATA:  Subsequent evaluation for cough and shortness of breath for a few days  EXAM: CHEST  2 VIEW  COMPARISON:  07/28/2014  FINDINGS: Stable cardiac enlargement and aortic arch calcification as well as mesh graft proximal aorta. Vascular pattern normal with no evidence of edema infiltrate or effusion. Mild bibasilar opacities again appear most consistent with scarring or atelectasis.  IMPRESSION: Stable bibasilar opacities.   Electronically Signed   By: Esperanza Heiraymond  Rubner M.D.   On: 07/30/2014 13:51   Dg Chest 2 View  07/29/2014   CLINICAL DATA:  Aspiration pneumonia, wheezing  EXAM: CHEST  2 VIEW  COMPARISON:  Portable chest x-ray 07/08/2014  FINDINGS: There  is parenchymal opacity remaining at the lung bases some of which may represent atelectasis, but residual pneumonia cannot be excluded. No effusion is seen. Heart size is stable.  IMPRESSION: Persistent basilar opacities consistent with atelectasis, but pneumonia cannot be excluded.   Electronically Signed   By: Dwyane DeePaul  Barry M.D.   On: 07/29/2014 08:21   Ct Head Wo Contrast  08/15/2014   CLINICAL DATA:  Followup subdural hematoma, status post craniotomy June 26, 2014.  EXAM: CT HEAD WITHOUT CONTRAST  TECHNIQUE: Contiguous axial images were obtained from the base of the skull through the vertex without intravenous contrast.  COMPARISON:  CT of the head July 03, 2014  FINDINGS: Status post LEFT frontoparietal craniotomy which was present on prior examination. Subjacent chronic small subdural hematoma measures up to 5 mm with apparent membrane formation. Slight mass effect on the subjacent sulci without midline shift. No rebleed. Resolution of the parafalcine subdural hematoma.  No intraparenchymal hemorrhage. Moderate to severe ventriculomegaly, likely on the basis of global parenchymal brain volume loss. Moderate white matter changes can be seen with chronic small vessel ischemic disease. No acute large vascular territory infarct. Moderate calcific atherosclerosis of the carotid siphons. Basal cisterns are patent.  Status post bilateral ocular lens implants. RIGHT greater LEFT maxillary sinus air-fluid levels and trace mucosal thickening. The mastoid air cells are well aerated.  IMPRESSION: 5 mm residual chronic LEFT subdural hematoma subjacent to craniotomy with probable membrane formation. Slight mass effect on the subjacent sulci without midline shift. Resolution of parafalcine subdural hematoma.  Stable moderate to severe global parenchymal brain volume loss. Moderate white matter changes can be seen with chronic small vessel ischemic disease.   Electronically Signed   By: Awilda Metroourtnay  Bloomer   On:  08/15/2014 00:30   Dg Chest Port 1 View  08/14/2014   CLINICAL DATA:  Shortness of breath  EXAM: PORTABLE CHEST - 1 VIEW  COMPARISON:  08/13/2014  FINDINGS: Cardiac shadow is stable. There again noted changes consistent with aortic valve repair. Lungs again demonstrate patchy infiltrative change worse on the right than left. This is increased slightly in the interval from the prior exam particularly on the right. No pneumothorax is seen. No bony abnormality is noted.  IMPRESSION: Bibasilar changes right greater than left which have increased slightly in the interval from the prior exam.   Electronically Signed   By: Alcide CleverMark  Lukens M.D.   On: 08/14/2014 07:32    Microbiology: Recent Results (from the past 240 hour(s))  Culture, Urine     Status: None   Collection Time: 08/07/14  3:39 PM  Result Value Ref Range Status   Specimen Description URINE, CLEAN CATCH  Final   Special Requests NONE  Final   Colony Count   Final    4,000 COLONIES/ML Performed at [facility]    Culture   Final    INSIGNIFICANT GROWTH Performed at [facility]    Report Status 08/08/2014 FINAL  Final  MRSA PCR Screening     Status: Abnormal   Collection Time: 08/14/14 12:39 PM  Result Value Ref Range Status   MRSA by PCR POSITIVE (A) NEGATIVE Final    Comment:        The GeneXpert MRSA Assay (FDA approved for NASAL specimens only), is one component of a comprehensive MRSA colonization surveillance program. It is not intended to diagnose MRSA infection nor to guide or monitor treatment for MRSA infections. RESULT CALLED TO, READ BACK BY AND VERIFIED WITH: Victorino Dike RN 16:00 08/14/14 ([email or web address])   Culture, expectorated sputum-assessment     Status: None   Collection Time: 08/15/14  2:00 PM  Result Value Ref Range Status   Specimen Description SPUTUM  Final   Special Requests NONE  Final   Sputum evaluation   Final    THIS SPECIMEN IS ACCEPTABLE. RESPIRATORY CULTURE REPORT TO FOLLOW.   Report  Status 08/15/2014 FINAL  Final  Culture, respiratory (NON-Expectorated)     Status: None (Preliminary result)   Collection Time: 08/15/14  2:00 PM  Result Value Ref Range Status   Specimen Description SPUTUM  Final   Special Requests NONE  Final   Gram Stain   Final    MODERATE WBC PRESENT, PREDOMINANTLY PMN FEW SQUAMOUS EPITHELIAL CELLS PRESENT FEW GRAM POSITIVE COCCI IN PAIRS IN CLUSTERS Performed at [facility]    Culture   Final    Culture reincubated for better growth Performed at [facility]    Report Status PENDING  Incomplete  Culture, blood (routine x 2)     Status: None (Preliminary result)   Collection Time: 08/15/14  2:24 PM  Result Value Ref Range Status   Specimen Description BLOOD RIGHT HAND  Final   Special Requests BOTTLES DRAWN AEROBIC ONLY 0.5CC  Final   Culture   Final           BLOOD CULTURE RECEIVED NO GROWTH TO DATE CULTURE WILL BE HELD FOR 5 DAYS BEFORE ISSUING A FINAL NEGATIVE REPORT Performed at [facility]    Report Status PENDING  Incomplete  Culture, blood (routine x 2)     Status: None (Preliminary result)   Collection Time: 08/15/14  2:31 PM  Result Value Ref Range Status   Specimen Description BLOOD RIGHT ARM  Final   Special Requests BOTTLES DRAWN AEROBIC ONLY 5CC  Final   Culture   Final           BLOOD CULTURE RECEIVED NO GROWTH TO DATE CULTURE WILL BE HELD FOR 5 DAYS BEFORE ISSUING A FINAL NEGATIVE REPORT Performed at [facility]    Report Status PENDING  Incomplete  Culture, Urine     Status: None   Collection Time: 08/15/14  6:06 PM  Result Value Ref Range Status   Specimen Description URINE, RANDOM  Final   Special Requests NONE  Final   Colony Count NO GROWTH Performed at [facility]   Final   Culture NO GROWTH Performed at [facility]   Final   Report Status 08/17/2014 FINAL  Final     Labs: Basic Metabolic Panel:  Recent Labs Lab 08/14/14 0750 08/15/14 0235  08/16/14 0125  NA 143 143 145  K 3.2* 3.6 4.2  CL 103 107 111  CO2 32 28 26  GLUCOSE 117* 150* 142*  BUN 27* 29* 38*  CREATININE 0.63 0.63 0.77  CALCIUM 9.0 8.5 8.5  MG  --   --  1.8  Liver Function Tests:  Recent Labs Lab 08/16/14 0125  AST 22  ALT 17  ALKPHOS 73  BILITOT 0.9  PROT 5.3*  ALBUMIN 1.4*   No results for input(s): LIPASE, AMYLASE in the last 168 hours. No results for input(s): AMMONIA in the last 168 hours. CBC:  Recent Labs Lab 08/14/14 0750 08/15/14 0235 08/16/14 0125  WBC 15.5* 15.9* 16.9*  NEUTROABS  --   --  15.2*  HGB 9.7* 8.5* 8.6*  HCT 30.6* 27.2* 27.4*  MCV 82.0 84.0 82.8  PLT 251 227 285   Cardiac Enzymes:  Recent Labs Lab 08/15/14 0235 08/15/14 0915 08/15/14 1935 08/16/14 0125 08/16/14 0645  TROPONINI 0.11* 0.30* 0.06* 0.11* 0.07*   BNP: BNP (last 3 results) No results for input(s): PROBNP in the last 8760 hours. CBG:  Recent Labs Lab 08/15/14 1641 08/15/14 2038 08/16/14 0003 08/16/14 0456 08/16/14 0736  GLUCAP 125* 131* 141* 114* 122*       Signed:  Christen Bedoya  [facility] 08/17/2014, 10:37 AM

## 2014-08-17 NOTE — Progress Notes (Signed)
Roy Mitchell to be D/C'd Home with Home Hospice per MD order.  Discussed with the patient/family and all questions fully answered.  VSS, Stage II noted to sacrum, foam dressing in place.   IV catheter discontinued intact. Site without signs and symptoms of complications. Dressing and pressure applied.  An After Visit Summary was printed and given to the patient's family. Family received prescriptions.  D/c education completed with patient/family including follow up instructions, medication list, d/c activities limitations if indicated, with other d/c instructions as indicated by MD.   Patient instructed to return to ED, call 911, or call MD for any changes in condition.   Patient escorted via stretcher and D/C home via EMS transport.  [name], [name] D 08/17/2014 12:01 PM

## 2014-08-17 NOTE — Clinical Social Work Note (Signed)
Contacted [facility] to scheduled ambulance transport to patient's home due to being on oxygen.  Spoke to patient's family and informed them that an ambulance transport has been scheduled for around 1:30pm today.    Roy KnackEric R. Kelsa Mitchell, MSW, LCSWA [phone] 08/17/2014 11:49 AM

## 2014-08-17 NOTE — Progress Notes (Signed)
Pt. very uncomfortable after resp treatment, has been coughing for more than 2hrs. Orally suctioned prn pain med given with little effect, repositioned etc. Will continue to monitor.

## 2014-08-17 NOTE — Progress Notes (Signed)
Social Work Discharge Note  The overall goal for the admission was met for:   Discharge location: No - pt was transferred to acute hospital to ICU  Length of Stay: No  Discharge activity level: No  Home/community participation: No  Services provided included: MD, RD, PT, OT, SLP, RN, Pharmacy and [place]: Medicare and Private Insurance:  [place]  Follow-up services arranged: Other: pt to acute hospital  Comments (or additional information):  Patient/Family verbalized understanding of follow-up arrangements: Yes  Individual responsible for coordination of the follow-up plan: pt's wife and medical team   Confirmed correct DME delivered: Roy Mitchell, Roy Mitchell 08/17/2014    [name], Roy Mitchell

## 2014-08-18 LAB — CULTURE, RESPIRATORY W GRAM STAIN

## 2014-08-18 LAB — CULTURE, RESPIRATORY

## 2014-08-22 LAB — CULTURE, BLOOD (ROUTINE X 2)
Culture: NO GROWTH
Culture: NO GROWTH

## 2014-09-15 DEATH — deceased

## 2014-09-16 ENCOUNTER — Encounter: Payer: Medicare Other | Admitting: Physical Medicine & Rehabilitation

## 2015-05-28 IMAGING — DX DG CHEST 2V
2 series · 2 of 2 positions shown · non-contrast
Comparison: 07/28/2014

CLINICAL DATA: Subsequent evaluation for cough and shortness of
breath for a few days

EXAM:
CHEST  2 VIEW

[chest lat]
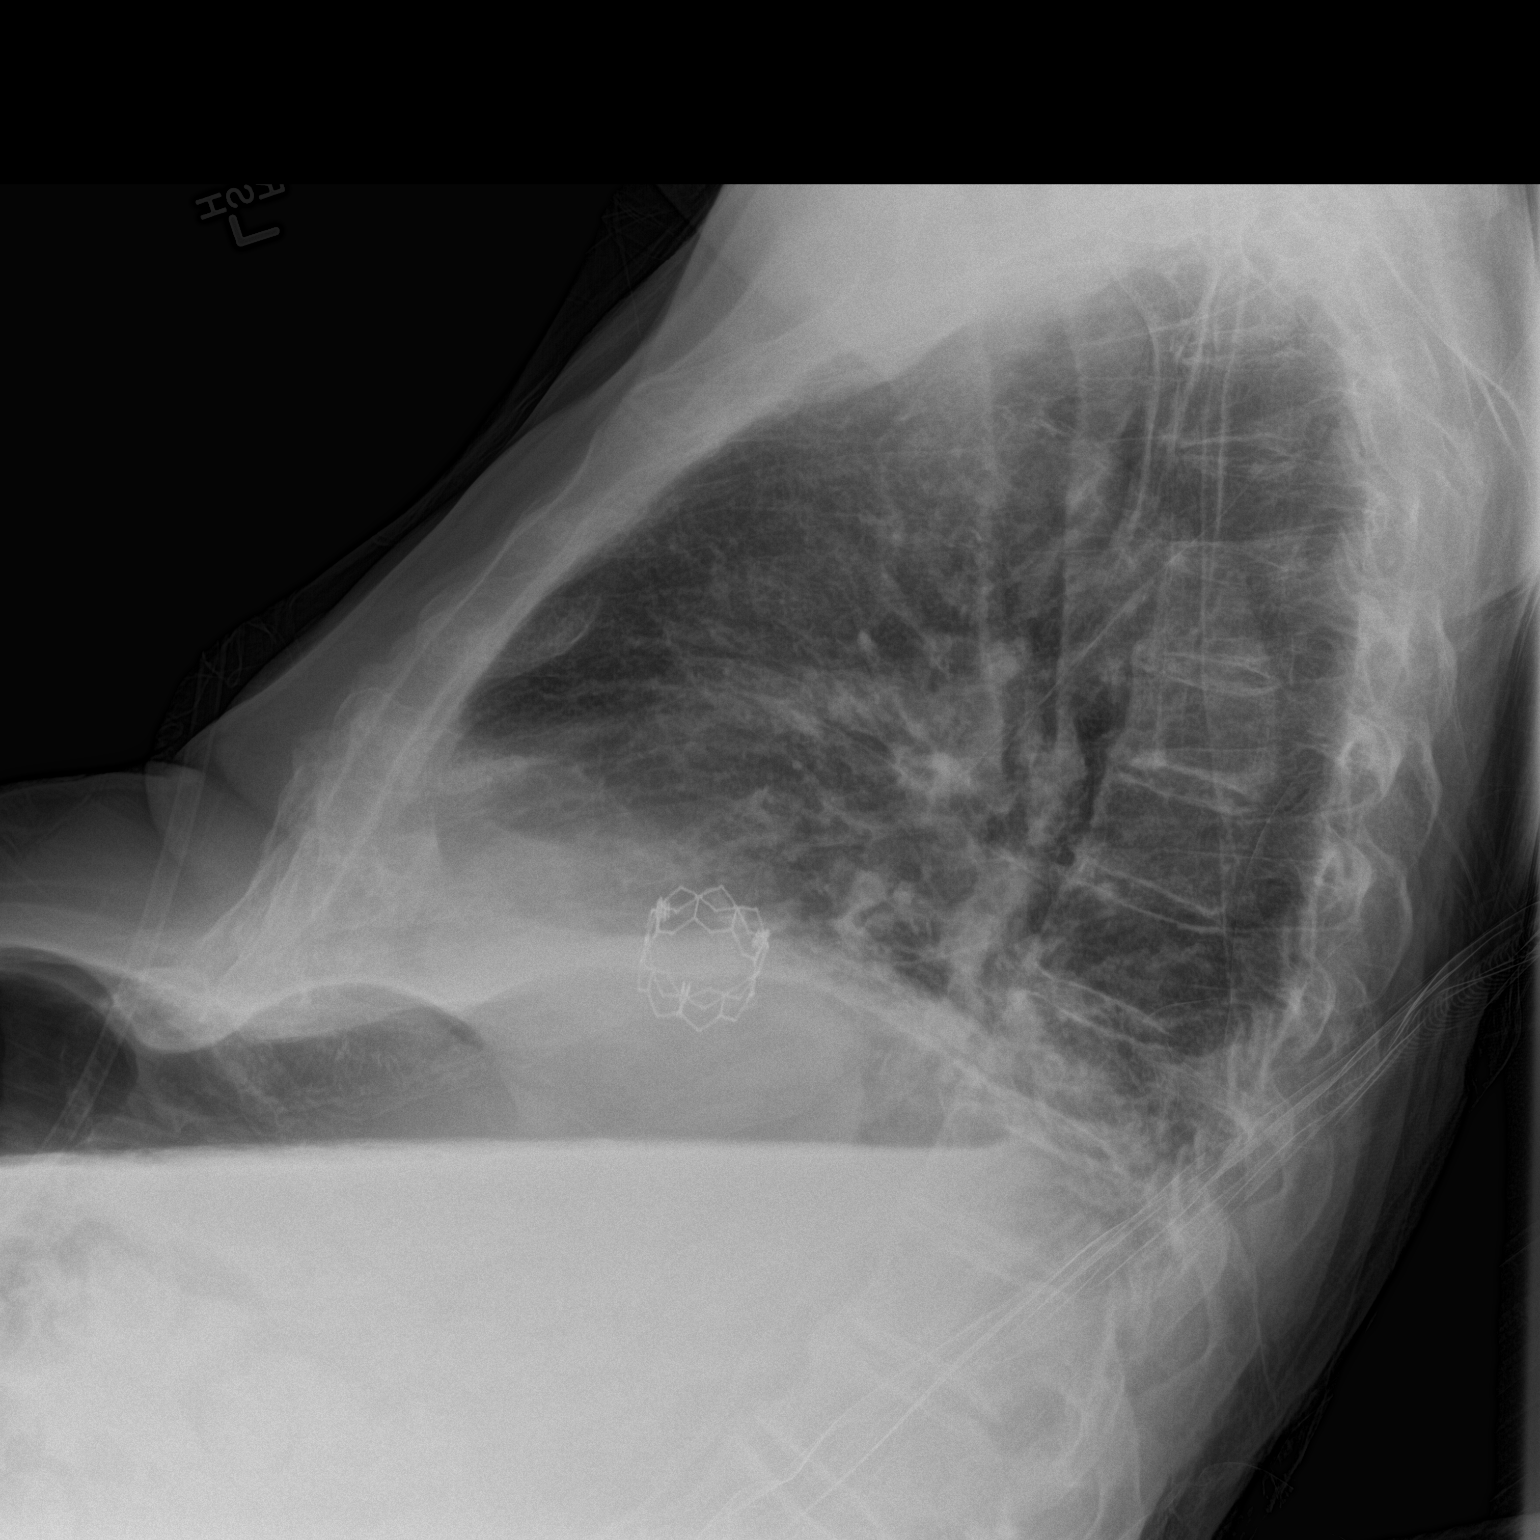

[chest ap]
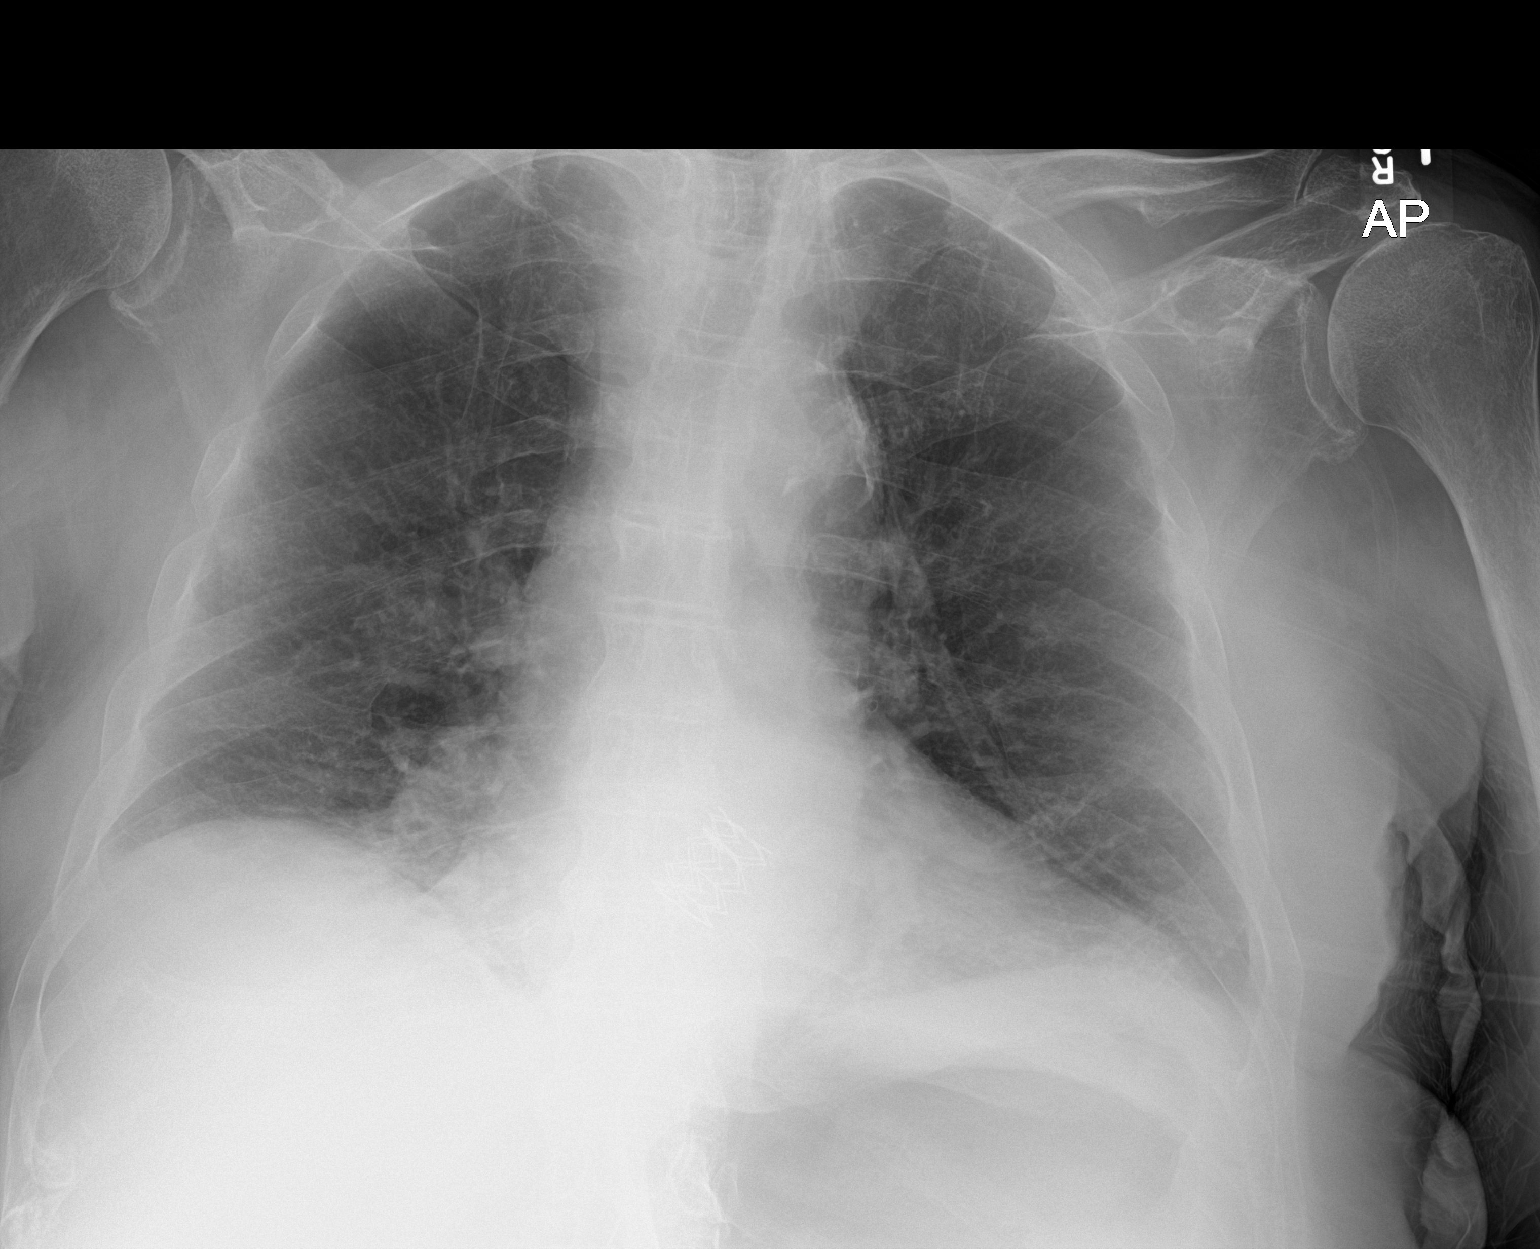

[2 of 2 positions shown; findings below may reference images not displayed]

FINDINGS: Stable cardiac enlargement and aortic arch calcification as well as
mesh graft proximal aorta. Vascular pattern normal with no evidence
of edema infiltrate or effusion. Mild bibasilar opacities again
appear most consistent with scarring or atelectasis.
IMPRESSION: Stable bibasilar opacities.

## 2015-06-13 IMAGING — CT CT HEAD W/O CM
1 series · 15 of 30 positions shown, 19 images · non-contrast
Comparison: CT of the head July 03, 2014

CLINICAL DATA: Followup subdural hematoma, status post craniotomy
June 26, 2014.

EXAM:
CT HEAD WITHOUT CONTRAST
TECHNIQUE: Contiguous axial images were obtained from the base of the skull
through the vertex without intravenous contrast.

[Series 2: head 5.0 h30s · axial · 0.42mm/px · z∈[-356,-206]mm · 15 of 34 slices shown, 19 images]
[im 2/34  brain]
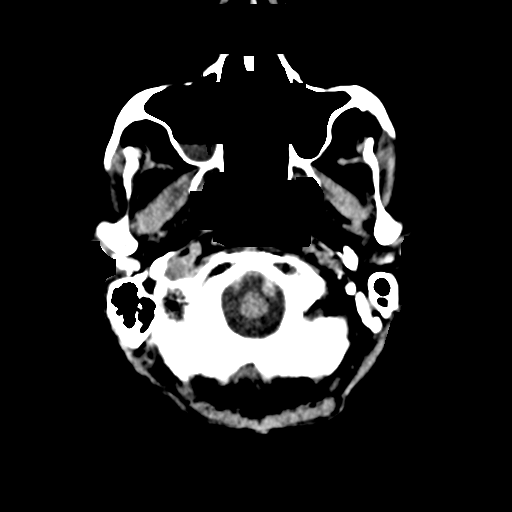
[im 2/34  bone]
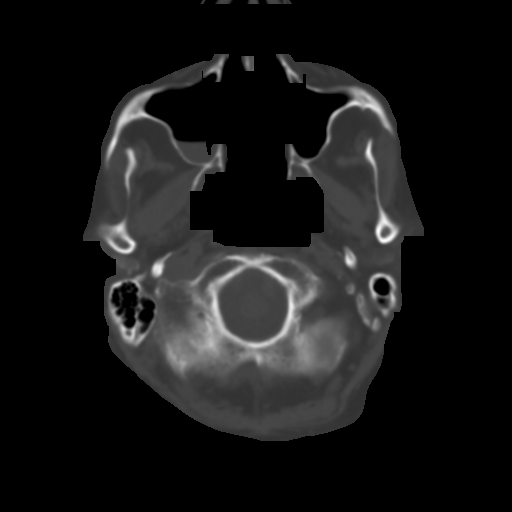
[im 4/34  brain]
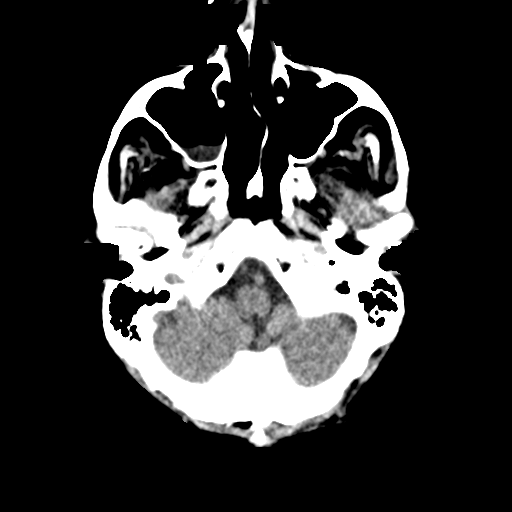
[im 6/34  brain]
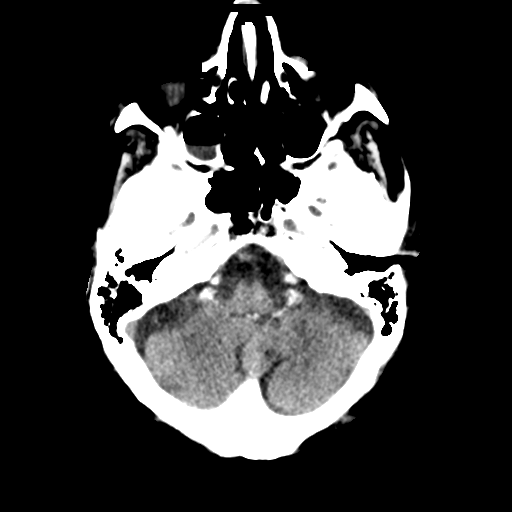
[im 8/34  brain]
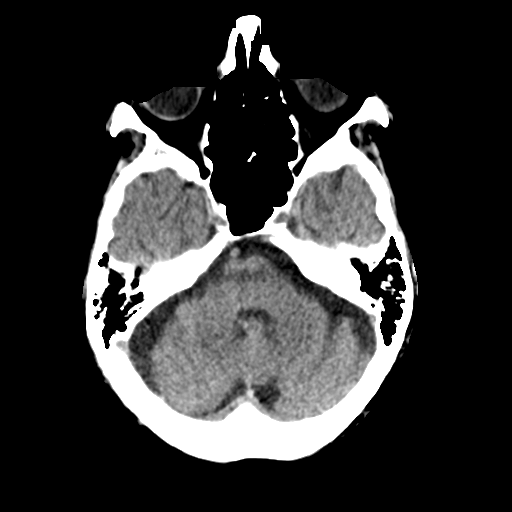
[im 11/34  brain]
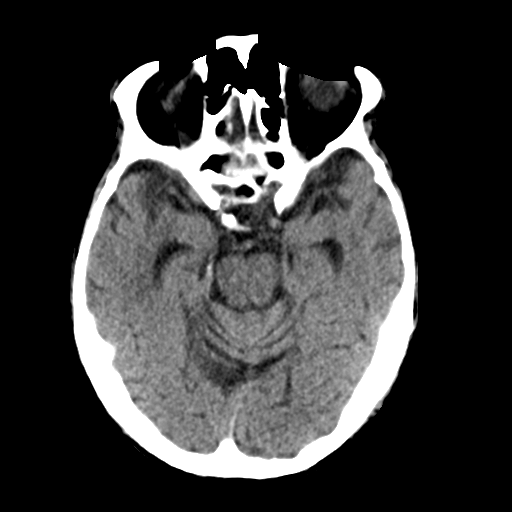
[im 11/34  bone]
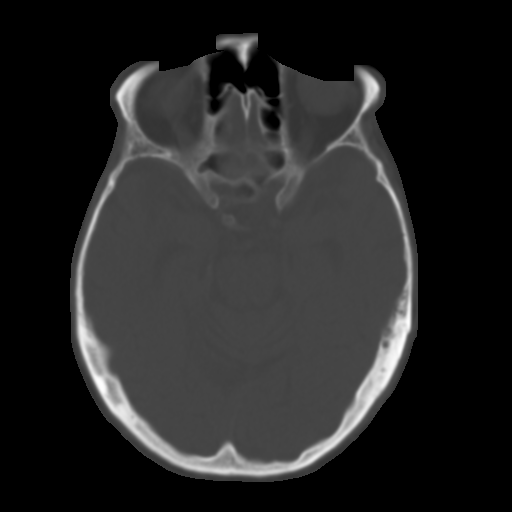
[im 13/34  brain]
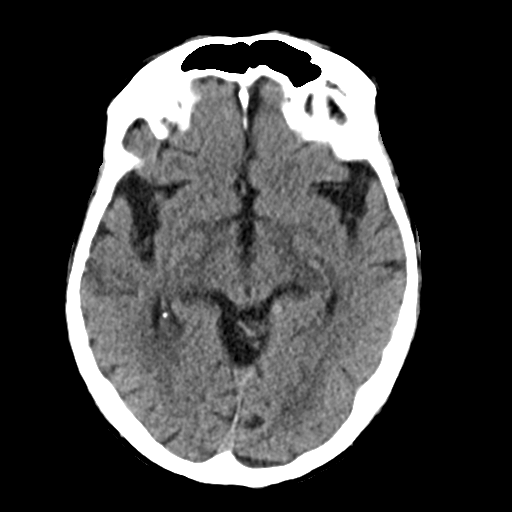
[im 15/34  brain]
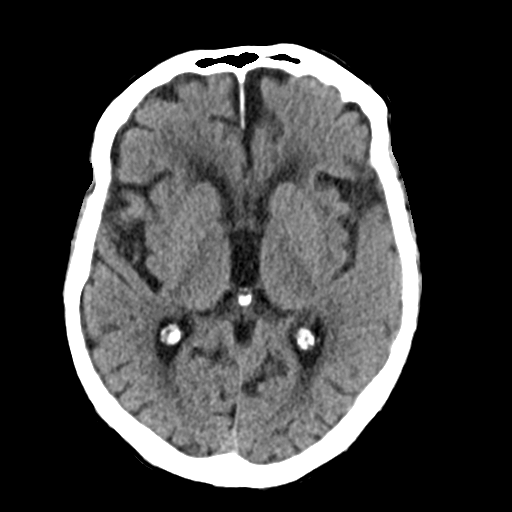
[im 18/34  brain]
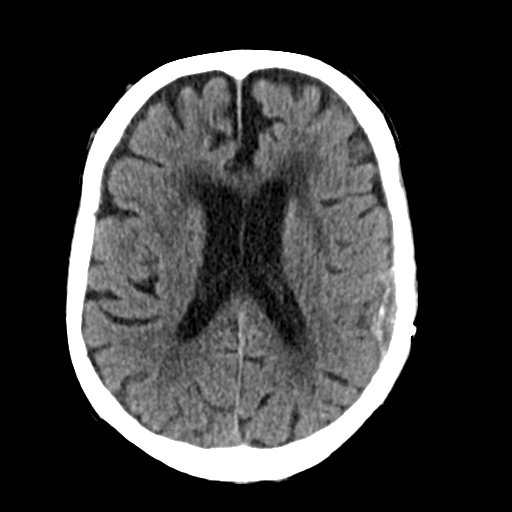
[im 19/34  brain]
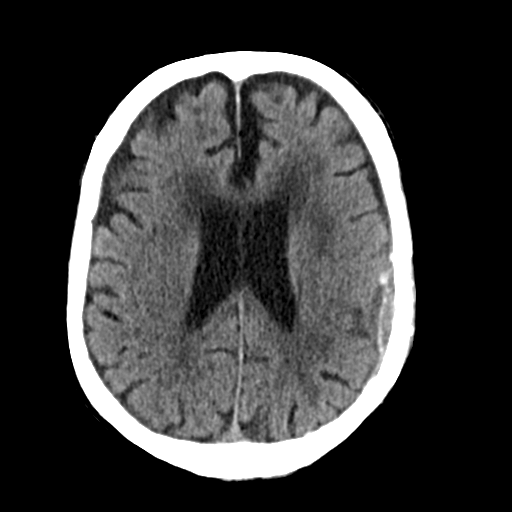
[im 19/34  bone]
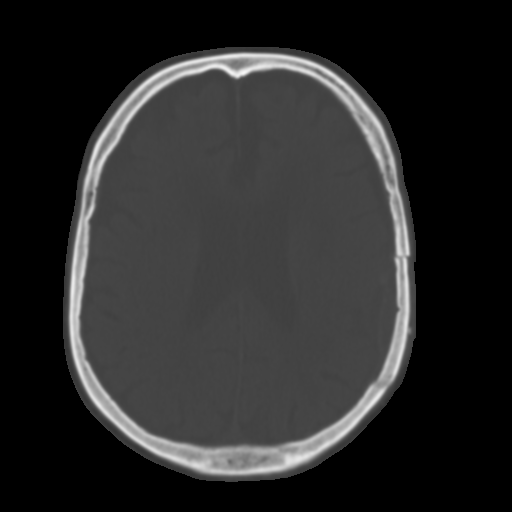
[im 21/34  brain]
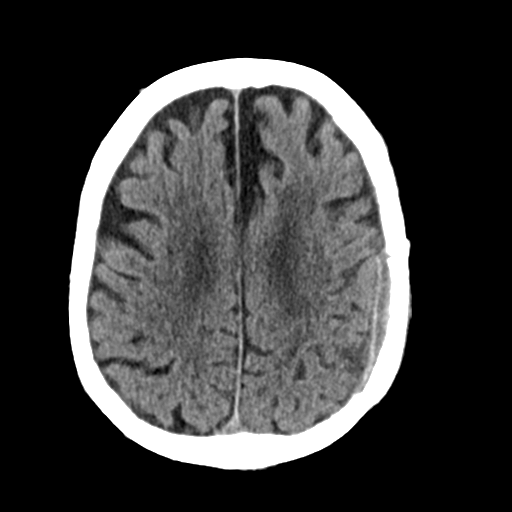
[im 23/34  brain]
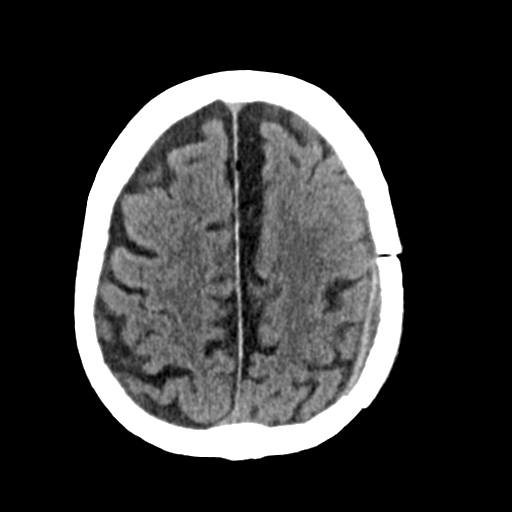
[im 26/34  brain]
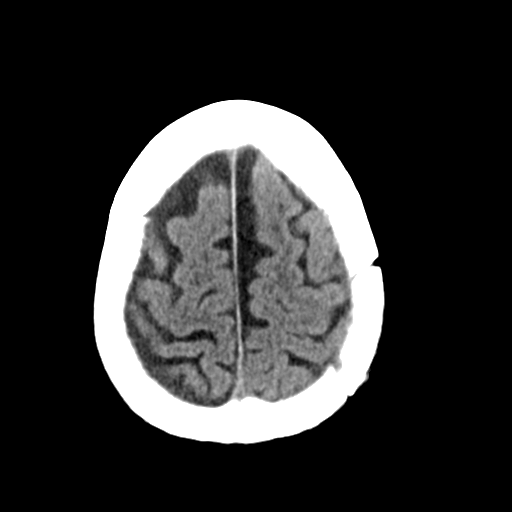
[im 28/34  brain]
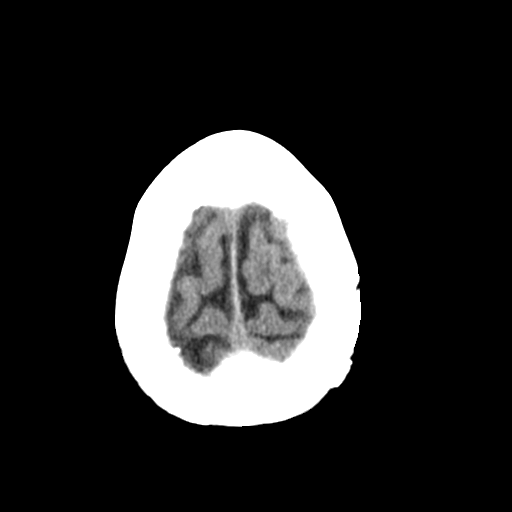
[im 28/34  bone]
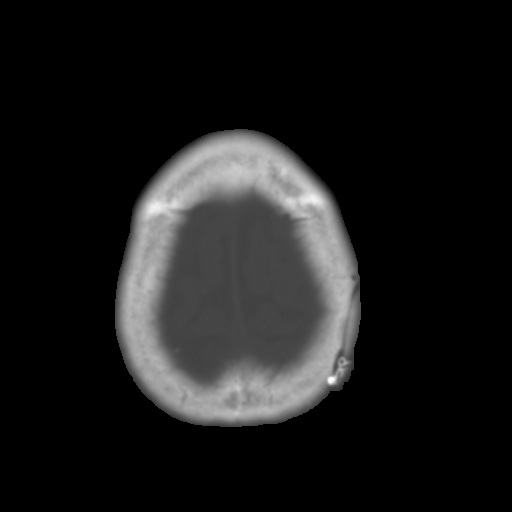
[im 30/34  brain]
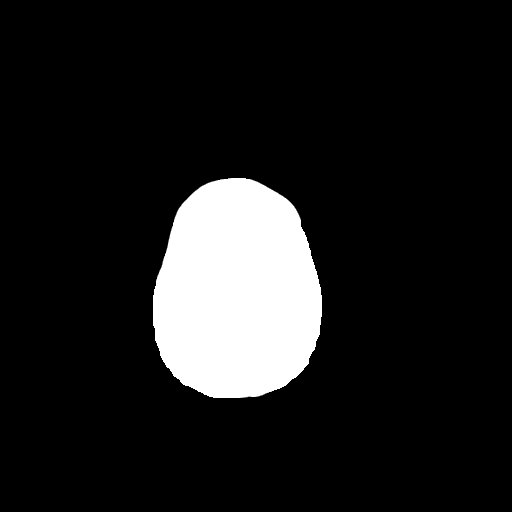
[im 32/34  brain]
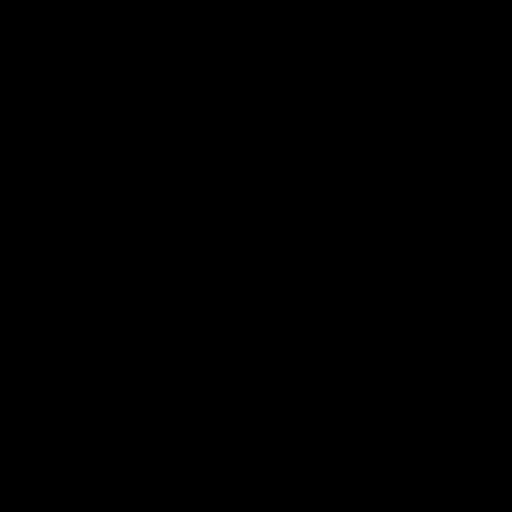

[15 of 30 positions shown; findings below may reference images not displayed]

FINDINGS: Status post LEFT frontoparietal craniotomy which was present on
prior examination. Subjacent chronic small subdural hematoma
measures up to 5 mm with apparent membrane formation. Slight mass
effect on the subjacent sulci without midline shift. No rebleed.
Resolution of the parafalcine subdural hematoma.

No intraparenchymal hemorrhage. Moderate to severe ventriculomegaly,
likely on the basis of global parenchymal brain volume loss.
Moderate white matter changes can be seen with chronic small vessel
ischemic disease. No acute large vascular territory infarct.
Moderate calcific atherosclerosis of the carotid siphons. Basal
cisterns are patent.

Status post bilateral ocular lens implants. RIGHT greater LEFT
maxillary sinus air-fluid levels and trace mucosal thickening. The
mastoid air cells are well aerated.
IMPRESSION: 5 mm residual chronic LEFT subdural hematoma subjacent to craniotomy
with probable membrane formation. Slight mass effect on the
subjacent sulci without midline shift. Resolution of parafalcine
subdural hematoma.

Stable moderate to severe global parenchymal brain volume loss.
Moderate white matter changes can be seen with chronic small vessel
ischemic disease.

  By: Zhen Bueno
